# Patient Record
Sex: Female | Born: 1970
Health system: Southern US, Community
[De-identification: ages and names within clinical notes are randomized; demographics above are authoritative.]

## PROBLEM LIST (undated history)

## (undated) DIAGNOSIS — F329 Major depressive disorder, single episode, unspecified: Secondary | ICD-10-CM

## (undated) DIAGNOSIS — IMO0002 Reserved for concepts with insufficient information to code with codable children: Secondary | ICD-10-CM

## (undated) DIAGNOSIS — B009 Herpesviral infection, unspecified: Secondary | ICD-10-CM

## (undated) DIAGNOSIS — R519 Headache, unspecified: Secondary | ICD-10-CM

## (undated) DIAGNOSIS — F1910 Other psychoactive substance abuse, uncomplicated: Secondary | ICD-10-CM

## (undated) DIAGNOSIS — I10 Essential (primary) hypertension: Secondary | ICD-10-CM

## (undated) DIAGNOSIS — E785 Hyperlipidemia, unspecified: Secondary | ICD-10-CM

## (undated) DIAGNOSIS — K219 Gastro-esophageal reflux disease without esophagitis: Secondary | ICD-10-CM

## (undated) DIAGNOSIS — Z87442 Personal history of urinary calculi: Secondary | ICD-10-CM

## (undated) DIAGNOSIS — D649 Anemia, unspecified: Secondary | ICD-10-CM

## (undated) DIAGNOSIS — R87629 Unspecified abnormal cytological findings in specimens from vagina: Secondary | ICD-10-CM

## (undated) DIAGNOSIS — K589 Irritable bowel syndrome without diarrhea: Secondary | ICD-10-CM

## (undated) DIAGNOSIS — E119 Type 2 diabetes mellitus without complications: Secondary | ICD-10-CM

## (undated) DIAGNOSIS — R011 Cardiac murmur, unspecified: Secondary | ICD-10-CM

## (undated) DIAGNOSIS — F419 Anxiety disorder, unspecified: Secondary | ICD-10-CM

## (undated) DIAGNOSIS — F32A Depression, unspecified: Secondary | ICD-10-CM

## (undated) HISTORY — DX: Cardiac murmur, unspecified: R01.1

## (undated) HISTORY — DX: Major depressive disorder, single episode, unspecified: F32.9

## (undated) HISTORY — DX: Essential (primary) hypertension: I10

## (undated) HISTORY — PX: JOINT REPLACEMENT: SHX530

## (undated) HISTORY — PX: ESOPHAGOGASTRODUODENOSCOPY ENDOSCOPY: SHX5814

## (undated) HISTORY — DX: Anemia, unspecified: D64.9

## (undated) HISTORY — DX: Hyperlipidemia, unspecified: E78.5

## (undated) HISTORY — DX: Unspecified abnormal cytological findings in specimens from vagina: R87.629

## (undated) HISTORY — PX: SACRAL NERVE STIMULATOR PLACEMENT: SHX2367

## (undated) HISTORY — DX: Reserved for concepts with insufficient information to code with codable children: IMO0002

## (undated) HISTORY — DX: Irritable bowel syndrome, unspecified: K58.9

## (undated) HISTORY — PX: APPENDECTOMY: SHX54

## (undated) HISTORY — DX: Depression, unspecified: F32.A

## (undated) NOTE — *Deleted (*Deleted)
Patient Care Team: Copland, Gwenlyn Found, MD as PCP - General (Family Medicine)  DIAGNOSIS: No diagnosis found.  CHIEF COMPLIANT: Follow-up of hemochromatosis   INTERVAL HISTORY: Lisa [name] is a 83 y.o. with above-mentioned history of leukocytopenia, hemochromatosis, and B-12 deficiency who receives phlebotomy every 2 months.She presents to the clinic today to review her labs.  ALLERGIES:  is allergic to nsaids, latex, and nitroglycerin.  MEDICATIONS:  Current Outpatient Medications  Medication Sig Dispense Refill  . diphenoxylate-atropine (LOMOTIL) 2.5-0.025 MG tablet TAKE 1 TABLET BY MOUTH 4 (FOUR) TIMES DAILY AS NEEDED FOR DIARRHEA OR LOOSE STOOLS. 120 tablet 2  . gabapentin (NEURONTIN) 300 MG capsule Take 2 capsules (600 mg total) by mouth 4 (four) times daily. 720 capsule 1  . Lidocaine (HM LIDOCAINE PATCH) 4 % PTCH Apply 1 patch topically daily as needed. 10 patch 11  . lisinopril (ZESTRIL) 10 MG tablet Take 1 tablet (10 mg total) by mouth daily. 90 tablet 3  . MAGNESIUM PO Take 1 tablet by mouth daily.    . medroxyPROGESTERone (DEPO-PROVERA) 150 MG/ML injection Inject 1 mL (150 mg total) into the muscle once. 1 mL 0  . metoCLOPramide (REGLAN) 10 MG tablet Take 1 tablet (10 mg total) by mouth every 6 (six) hours as needed for nausea (or headache). 30 tablet 0  . nitrofurantoin, macrocrystal-monohydrate, (MACROBID) 100 MG capsule Take 1 capsule (100 mg total) by mouth 2 (two) times daily. 14 capsule 0  . ondansetron (ZOFRAN) 4 MG tablet Take 1 tablet (4 mg total) by mouth every 8 (eight) hours as needed for nausea or vomiting. 30 tablet 0  . pantoprazole (PROTONIX) 40 MG tablet Take 1 tablet (40 mg total) by mouth daily. 90 tablet 3  . POTASSIUM PO Take 1 tablet by mouth daily.    . simvastatin (ZOCOR) 20 MG tablet Take 1 tablet (20 mg total) by mouth every evening. 90 tablet 3  . SUMAtriptan (IMITREX) 50 MG tablet Take 1 tablet (50 mg total) by mouth every 2 (two) hours as needed  for migraine. Max 100 mg in 24 hours 10 tablet 3  . traMADol (ULTRAM) 50 MG tablet Take 1 tablet (50 mg total) by mouth 3 (three) times daily. 90 tablet 1  . Travoprost, BAK Free, (TRAVATAN Z) 0.004 % SOLN ophthalmic solution     . traZODone (DESYREL) 50 MG tablet TAKE 1/2 TO 1 TABLET BY MOUTH AT BEDTIME AS NEEDED FOR SLEEP. MAY TAKE 2 AS NEEDED 180 tablet 3  . Vitamin D, Ergocalciferol, (DRISDOL) 1.25 MG (50000 UT) CAPS capsule Take 1 capsule (50,000 Units total) by mouth every 7 (seven) days. 8 capsule 0   No current facility-administered medications for this visit.    PHYSICAL EXAMINATION: ECOG PERFORMANCE STATUS: {CHL ONC ECOG [phone]}  There were no vitals filed for this visit. There were no vitals filed for this visit.  LABORATORY DATA:  I have reviewed the data as listed CMP Latest Ref Rng & Units 11/25/2019 04/07/2019 03/18/2018  Glucose 70 - 99 mg/dL 161(W) 960(A) 540(J)  BUN 6 - 20 mg/dL 8 10 8   Creatinine 0.44 - 1.00 mg/dL 8.11 9.14(N) 8.29  Sodium 135 - 145 mmol/L 141 141 136  Potassium 3.5 - 5.1 mmol/L 3.9 4.5 3.5  Chloride 98 - 111 mmol/L 108 104 104  CO2 22 - 32 mmol/L 24 22 21(L)  Calcium 8.9 - 10.3 mg/dL 9.3 9.2 5.6(O)  Total Protein 6.5 - 8.1 g/dL 7.4 6.9 -  Total Bilirubin 0.3 - 1.2  mg/dL 0.9(W) 0.3 -  Alkaline Phos 38 - 126 U/L 51 70 -  AST 15 - 41 U/L 38 147(H) -  ALT 0 - 44 U/L 28 80(H) -    Lab Results  Component Value Date   WBC 4.1 11/25/2019   HGB 11.5 (L) 11/25/2019   HCT 35.9 (L) 11/25/2019   MCV 97.8 11/25/2019   PLT 214 11/25/2019   NEUTROABS 1.9 11/25/2019    ASSESSMENT & PLAN:  No problem-specific Assessment & Plan notes found for this encounter.    No orders of the defined types were placed in this encounter.  The patient has a good understanding of the overall plan. she agrees with it. she will call with any problems that may develop before the next visit here.  Total time spent: *** mins including face to face time and time  spent for planning, charting and coordination of care  Serena Croissant, MD 06/21/2020  I, Kirt Boys Dorshimer, am acting as scribe for Dr. Serena Croissant.  {insert scribe attestation}

---
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
## 2004-02-29 ENCOUNTER — Inpatient Hospital Stay (HOSPITAL_COMMUNITY): Admission: AD | Admit: 2004-02-29 | Discharge: 2004-03-03 | Payer: Self-pay | Admitting: Obstetrics and Gynecology

## 2006-06-21 ENCOUNTER — Inpatient Hospital Stay (HOSPITAL_COMMUNITY): Admission: EM | Admit: 2006-06-21 | Discharge: 2006-06-23 | Payer: Self-pay | Admitting: Emergency Medicine

## 2008-04-08 ENCOUNTER — Inpatient Hospital Stay (HOSPITAL_COMMUNITY): Admission: EM | Admit: 2008-04-08 | Discharge: 2008-04-14 | Payer: Self-pay | Admitting: Emergency Medicine

## 2008-11-15 ENCOUNTER — Encounter: Admission: RE | Admit: 2008-11-15 | Discharge: 2008-11-15 | Payer: Self-pay | Admitting: Gastroenterology

## 2009-07-20 ENCOUNTER — Emergency Department (HOSPITAL_COMMUNITY): Admission: EM | Admit: 2009-07-20 | Discharge: 2009-07-20 | Payer: Self-pay | Admitting: Emergency Medicine

## 2009-08-26 ENCOUNTER — Emergency Department (HOSPITAL_COMMUNITY): Admission: EM | Admit: 2009-08-26 | Discharge: 2009-08-26 | Payer: Self-pay | Admitting: Family Medicine

## 2009-09-01 ENCOUNTER — Encounter: Admission: RE | Admit: 2009-09-01 | Discharge: 2009-09-01 | Payer: Self-pay | Admitting: Family Medicine

## 2010-10-29 LAB — POCT I-STAT, CHEM 8
Calcium, Ion: 1.15 mmol/L (ref 1.12–1.32)
Creatinine, Ser: 0.4 mg/dL (ref 0.4–1.2)
Glucose, Bld: 105 mg/dL — ABNORMAL HIGH (ref 70–99)
HCT: 43 % (ref 36.0–46.0)
Hemoglobin: 14.6 g/dL (ref 12.0–15.0)
Potassium: 3.2 mEq/L — ABNORMAL LOW (ref 3.5–5.1)
TCO2: 27 mmol/L (ref 0–100)

## 2010-10-29 LAB — DIFFERENTIAL
Basophils Absolute: 0 10*3/uL (ref 0.0–0.1)
Basophils Relative: 1 % (ref 0–1)
Eosinophils Relative: 1 % (ref 0–5)
Lymphs Abs: 1.9 10*3/uL (ref 0.7–4.0)
Monocytes Absolute: 0.4 10*3/uL (ref 0.1–1.0)
Monocytes Relative: 10 % (ref 3–12)

## 2010-10-29 LAB — CBC
Hemoglobin: 13.5 g/dL (ref 12.0–15.0)
RBC: 3.88 MIL/uL (ref 3.87–5.11)
WBC: 4.2 10*3/uL (ref 4.0–10.5)

## 2010-11-14 LAB — POCT I-STAT, CHEM 8
Creatinine, Ser: 0.3 mg/dL — ABNORMAL LOW (ref 0.4–1.2)
Hemoglobin: 14.3 g/dL (ref 12.0–15.0)
Sodium: 139 mEq/L (ref 135–145)
TCO2: 21 mmol/L (ref 0–100)

## 2010-12-26 NOTE — H&P (Signed)
Lisa [name], Lisa [name].:  [id number]   MEDICAL RECORD NO.:  [id number]          [name] TYPE:  EMS   LOCATION:  ED                           FACILITY:  [facility]   PHYSICIAN:  Velora Heckler, MD      DATE OF BIRTH:  July 03, 1971   DATE OF ADMISSION:  04/08/2008  DATE OF DISCHARGE:                              HISTORY & PHYSICAL   REFERRING PHYSICIAN:  Janace Hoard, M.D., [facility].   GASTROENTEROLOGIST:  Shirley Friar, M.D., [facility].   CHIEF COMPLAINT:  Abdominal pain, contained perforation, duodenal ulcer.   HISTORY OF PRESENT ILLNESS:  Lisa [name] is a 40 year old female from  [place], [place].  She presented to [facility] with a  48-hour history of abdominal pain.  This was localized in Lisa right  upper quadrant.  Lisa pain radiated to Lisa back.  It had become more  severe in Lisa past 12 hours.  Lisa [name] was sent to [facility] for a CT scan of Lisa abdomen to rule out appendicitis.  Inflammatory changes were noted around Lisa duodenum.  Upper GI series  was performed, which reportedly demonstrated perforation of Lisa  duodenum.  Lisa [name] was referred to general surgery for evaluation  and management.  Case was discussed with Dr. Charlott Rakes.   PAST MEDICAL HISTORY:  History of peptic ulcer disease, history of  irritable bowel syndrome, last EGD performed by Dr. Bosie Clos January  2009.   MEDICATIONS:  Nexium, Depo-Provera.   ALLERGIES:  None known.   SOCIAL HISTORY:  Lisa [name] is single.  She has a 70-year-old daughter.  She smokes less than a pack of cigarettes a day.  She drinks wine daily.  She works as a Engineer, materials at [employer]   FAMILY HISTORY:  Noncontributory.   REVIEW OF SYSTEMS:  Fifteen-system review without significant other  findings except as noted above.   PHYSICAL EXAMINATION:  GENERAL:  A 40 year old well-developed, well-  nourished female in no acute  distress.  VITAL SIGNS:  Temperature 97.9, pulse 105, respirations 16, blood  pressure 130/91.  HEENT:  Shows her to be normocephalic, atraumatic.  Sclerae are clear.  Conjunctivae clear.  Pupils equal and reactive.  Dentition good.  Mucous  membranes moist.  Voice normal.  NECK:  Palpation of Lisa neck shows no thyroid nodularity.  No  lymphadenopathy.  No tenderness.  No mass.  LUNGS:  Clear to auscultation bilaterally without rales, rhonchi or  wheeze.  CARDIAC:  Shows regular rate and rhythm without murmur.  Peripheral  pulses are full.  EXTREMITIES:  Nontender without edema.  ABDOMEN:  Soft without distention.  No surgical wounds.  Bowel sounds  are present.  Palpation shows mild-to-moderate tenderness in Lisa right  upper quadrant.  There is mild guarding.  There is no rebound  tenderness.  There is no palpable mass.  There is no hepatosplenomegaly.  There is no sign of herniation.  NEUROLOGIC:  Lisa [name] is alert and oriented without focal deficit.  There is no sign of tremor.   Laboratory values  provided by [facility] include a white count  of 8.5, hemoglobin 13.0, platelet 248,000.   Radiographic studies:  A CT scan abdomen and pelvis and upper GI series  views are provided on a DVD from Lisa [facility] radiologist.  These  are reviewed with Lisa radiology department here at [facility].  There are inflammatory changes around Lisa duodenum with a  small amount of fluid layering in Morison's pouch.  There is no sign of  free air.  Upper GI series does not demonstrate obvious free  perforation.   IMPRESSION:  Contained perforation of duodenal ulcer.   PLAN:  Lisa [name] will be admitted on Lisa general surgical service.  She will be started on empiric intravenous antibiotics.  She will be  held NPO and receive intravenous hydration.  She will be started on  intravenous proton pump inhibitors.  Lisa [name] will be observed  closely.   I  discussed with Lisa [name] Lisa options of immediate surgical  intervention, which will be a rather extensive procedure given Lisa site  of perforation, versus nonoperative management initially.  I did caution  her that if nonoperative management were to fail she would require a  more extensive surgical procedure.  She understands.   Lisa [name] will be admitted on my general surgical service.      Velora Heckler, MD  Electronically Signed     TMG/MEDQ  D:  04/08/2008  T:  04/08/2008  Job:  [id number]   cc:   Shirley Friar, MD  Fax: [phone]   Peyton Najjar, MD

## 2010-12-26 NOTE — Discharge Summary (Signed)
Lisa [name], BEHRING NO.:  [id number]   MEDICAL RECORD NO.:  [id number]          PATIENT TYPE:  INP   LOCATION:  1502                         FACILITY:  [facility]   PHYSICIAN:  Velora Heckler, MD      DATE OF BIRTH:  Feb 21, 1971   DATE OF ADMISSION:  04/08/2008  DATE OF DISCHARGE:  04/14/2008                               DISCHARGE SUMMARY   REASON FOR ADMISSION:  Abdominal pain, contained perforation of duodenal  ulcer.   HISTORY OF PRESENT ILLNESS:  The patient is a 40 year old female from  [place], [place].  She presented to urgent medical care to Dr.  Janace Hoard with 48-hour history of abdominal pain.  This was localized  to the right upper quadrant.  Pain radiated to the back.  The patient  underwent CT scan of the abdomen to rule out appendicitis.  Inflammatory  changes were noted around the duodenum.  Upper GI series was performed  at [facility] and reportedly demonstrated perforation of the  duodenum.  The patient was referred to general surgery.  She was  admitted to [facility].  After review of diagnostic  studies, she was admitted on the general surgical service.   HOSPITAL COURSE:  The patient was admitted and placed at bowel rest.  She received intravenous fluids and intravenous antibiotics.  She was  placed on a Protonix infusion.  She was seen in consultation by Dr.  Charlott Rakes from [facility].  The patient stabilized.  She remained afebrile.  Pain gradually improved.  On the third hospital  day, she underwent upper GI series in radiology.  This showed no  evidence of leak from the duodenum.  The patient was started on clear  liquids and advanced to a bland diet which she tolerated well.  The  patient remained stable and clinically improved on her Protonix  infusion.  She was switched to oral medications and was prepared for  discharge home.   DISCHARGE/PLAN:  The patient is discharged  home today, September 2, in  good condition, tolerating a regular diet, ambulating independently.   DISCHARGE MEDICATIONS:  1. Include Protonix 40 mg p.o. b.i.d.  2. Vicodin as needed for pain.   The patient will be seen back in the office of Dr. Charlott Rakes at  [facility] in 2 weeks.  The patient will be seen back in my  office at [facility] as needed.   FINAL DIAGNOSES:  1. Contained perforation duodenal ulcer.  2. Peptic ulcer disease.  3. Abdominal pain.   CONDITION AT DISCHARGE:  Improved.      Velora Heckler, MD  Electronically Signed     TMG/MEDQ  D:  04/14/2008  T:  04/14/2008  Job:  [id number]   cc:   Peyton Najjar, MD   Shirley Friar, MD  Fax: [phone]   [facility]

## 2010-12-26 NOTE — Consult Note (Signed)
Lisa [name], Lisa [name].:  [id number]   MEDICAL RECORD NO.:  [id number]          PATIENT TYPE:  INP   LOCATION:  1502                         FACILITY:  [facility]   PHYSICIAN:  Shirley Friar, MDDATE OF BIRTH:  10-27-1970   DATE OF CONSULTATION:  DATE OF DISCHARGE:                                 CONSULTATION   We were asked to see Ms. Degroote today in consultation by Dr. Adela Glimpse of  the [facility] Service for duodenal perforation.   HISTORY OF PRESENT ILLNESS:  This is a very pleasant 40 year old female  who has had a history of three previous ulcerations.  She was H. pylori  positive and was treated, had a negative H. pylori breath test after  treatment on April 11, 2007.  She tells me that she started feeling  burning in her epigastrium and then muscle soreness in her lower  abdominal quadrants bilaterally about three days ago.  After 24 hours,  she went to her primary care physician, and after a subsequent CT scan  and upper GI series with small-bowel follow-through was found to have a  duodenal perforation.  She was seen by surgery, who feels that the  perforation is contained and that she does not need surgery at this  time.  She describes no fever, vomiting or change in bowel habits.  She  tells me that she has had some abdominal bloating for the past week.  The patient denies any NSAID use.  She is using only Tylenol.  No Alka-  Seltzer.  No Pepto-Bismol.  Also denies cocaine use, drug use.  She does  smoke approximately five cigarettes a day and consumes approximately  three glasses of red wine per day.   Past medical history is significant for peptic ulcer disease and GI  bleed secondary to her peptic ulcer disease.  As mentioned, she was  treated for H. pylori in 2008.  She has a history of IBS, diarrhea  predominant.  Her last upper endoscopy was done August 27, 2007.  Her  most recent ulcer was found to have healed.  The endoscopy exam  was  normal other than a small hiatal hernia.   Current medications include Nexium and Depo-Provera.   She has no known drug allergies.   REVIEW OF SYSTEMS:  As per HPI.   SOCIAL HISTORY:  Per HPI.  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   FAMILY HISTORY:  The patient tells me that she was adopted from [place].  Her family history is unknown.   PHYSICAL EXAMINATION:  GENERAL:  She is alert and oriented, in no  apparent distress, very pleasant to speak with.  VITAL SIGNS:  Her pulse is 108, temperature 98.4, respirations 20, blood  pressure is 99/64.  CARDIOVASCULAR:  Cardiovascular system demonstrates a tachy rate but no  murmurs, rubs or gallops appreciated.  LUNGS:  Clear to auscultation with no wheezes or crackles.  ABDOMEN:  Soft, surprisingly nontender secondary to receiving morphine,  mildly distended.   Labs show an amylase of  27.  BMET significant for a potassium of 3.3 and  glucose of 146.  Her BUN is 1 and creatinine 0.53.  LFTs show a mildly  elevated total bilirubin of 1.6.  ALT is 32, AST is 34, alk phos of 43,  PT is 13.2.   On radiological exam per report she had a CT of her abdomen and pelvis  at [facility] that showed duodenal thickening.  Following  that, she had an upper GI series also at [facility] that  showed duodenal perforation.   ASSESSMENT:  This is a 40 year old female with a contained duodenal  perforation.  The patient looks well at the moment.  This is her fourth  ulceration.   PLAN:  The patient has already been placed on IV Protonix infusion,  antibiotics, IV fluids, pain medications, and she is currently NPO.  We  recommend smoking cessation.  Consider recurrent H. pylori as well as  hypersecretory syndrome such as Zollinger-Ellison or even possible  malignancy.  Once her perforation is healed, she will likely need an  upper endoscopy with biopsies to rule out malignancy.  We  will plan to  check a serum gastrin at this time with some hesitancy as PPI therapy  can raise the gastrin level.  However, if she has ZES then gastrin level  will be markedly elevated despite ongoing PPI use.  We will follow with  you.  Thanks very much for this consultation.      Stephani Police, [place]      Shirley Friar, MD  Electronically Signed    MLY/MEDQ  D:  04/09/2008  T:  04/09/2008  Job:  [id number]   cc:   Shirley Friar, MD  Fax: [phone]   Velora Heckler, MD  [phone] [address]  [place]  [place] [address]   Peyton Najjar, MD

## 2010-12-29 NOTE — Op Note (Signed)
NAMEMICHOL, Lisa [name].:  [id number]   MEDICAL RECORD NO.:  [id number]          PATIENT TYPE:  INP   LOCATION:  3114                         FACILITY:  [facility]   PHYSICIAN:  Shirley Friar, MDDATE OF BIRTH:  1971/06/05   DATE OF PROCEDURE:  06/21/2006  DATE OF DISCHARGE:                                 OPERATIVE REPORT   PROCEDURE:  Upper endoscopy.   INDICATIONS:  Melena, hematemesis.   MEDICATIONS:  Fentanyl 100 mcg IV, Versed 10 mg IV, Cetacaine spray.   FINDINGS:  The endoscope was inserted in the oropharynx and the esophagus  was intubated which was normal in its entirety.  The endoscope was advanced  down to the stomach and no blood was present in the stomach.  There was  scattered erythematous areas in the antrum consistent with gastritis.  Retroflexion was done which revealed scattered punctate hemorrhages in the  fundus and cardia, consistent with gastritis without any active bleeding.  The endoscope was straightened and advanced down into the duodenal bulb and  in the posterior wall of the duodenal bulb was a 5 mm ulcer with a flat red  spot in the central portion.  There were patchy areas of erythema and edema  in other parts of the duodenal bulb consistent with duodenitis.  The second  portion of the duodenum appeared normal.  The endoscope was withdrawn and  the ulcer was re-evaluated and since there was no active bleeding and no  evidence of a vessel protuberance, no intervention was performed.  The flat  red spot was again noted and no bleeding was present.   ASSESSMENT:  1. Peptic ulcer disease (duodenal ulcer).  2. Gastritis.  3. Duodenitis.   PLAN:  1. Change Protonix to 8 mg an hour for continuous infusion.  2. Clear liquid diet.  3. Check H. pylori serology.  4. Transfuse as needed and follow serial hemoglobins.      Shirley Friar, MD  Electronically Signed     VCS/MEDQ  D:  06/21/2006  T:  06/21/2006  Job:   [id number]   cc:   Graylin Shiver, M.D.
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
## 2010-12-29 NOTE — H&P (Signed)
NAMEMARLENA, [name].:  [id number]   MEDICAL RECORD NO.:  [id number]          PATIENT TYPE:  INP   LOCATION:  3114                         FACILITY:  [facility]   PHYSICIAN:  Graylin Shiver, M.D.   DATE OF BIRTH:  1970/08/29   DATE OF ADMISSION:  06/20/2006  DATE OF DISCHARGE:                                HISTORY & PHYSICAL   CHIEF COMPLAINT:  Black stool, vomiting blood.   HISTORY OF PRESENT ILLNESS:  Patient is a 40 year old female who began to  pass melena 24 hours prior to my seeing her.  She got weak, dizzy, and  diaphoretic.  At 2:00 p.m. on June 20, 2006, she vomited some blood and  then did this again at 4:30 p.m.  She has not vomited since.  It has been 8  or 9 hours since vomiting anything.  She took some ibuprofen 2 weeks ago and  also took some today.  She denies abdominal pain.  She went to the urgent  care at [facility] and was sent to the emergency room.  Her hemoglobin was found  to be 7.1.  The patient was told in the past that she might have an ulcer  but this was never documented.  She drinks, she said, 2 or 3 glasses of wine  every night.   PAST MEDICAL HISTORY:  Medical problem:  History of IBS.   PAST SURGICAL HISTORY:  None.   ALLERGIES:  None.   MEDICATIONS:  Depo q.3 months.   SOCIAL HISTORY:  Smokes 2 or 3 cigarettes a day, ETOH history as above.   FAMILY HISTORY:  Non contributory.   REVIEW OF SYSTEMS:  Negative except for above.   PHYSICAL EXAMINATION:  Blood pressure 90/42, pulse 92, respirations 18.  She  does not appear in any acute distress.  She is pale, alert, oriented.  NECK:  Supple, no masses, adenopathy, or goiter.  HEART:  Regular rhythm, no murmurs.  LUNGS:  Clear.  ABDOMEN:  Bowel sounds normal, soft, non tender, no hepatosplenomegaly.  EXTREMITIES:  No edema.   IMPRESSION:  Upper GI bleed.   PLAN:  Patient is being admitted to the hospital at [facility].  She will be  given IV hydration, Protonix IV 40 mg  b.i.d.  She will be transfused blood.  We will proceed with EGD in the morning.           ______________________________  Graylin Shiver, M.D.     SFG/MEDQ  D:  06/21/2006  T:  06/21/2006  Job:  [id number]

## 2010-12-29 NOTE — Op Note (Signed)
NAME:  Lisa [name], Lisa [name]                           ACCOUNT NO.:  [id number]   MEDICAL RECORD NO.:  [id number]                   PATIENT TYPE:  INP   LOCATION:  9163                                 FACILITY:  [facility]   PHYSICIAN:  Lenoard Aden, M.D.             DATE OF BIRTH:  04/01/1971   DATE OF PROCEDURE:  03/01/2004  DATE OF DISCHARGE:                                 OPERATIVE REPORT   INDICATIONS FOR PROCEDURE:  Persistent variable decelerations with secondary  fetal tachycardia.   SURGEON:  Lenoard Aden, M.D.   PROCEDURE:  ___________ vacuum assisted delivery.   ANESTHESIA:  Epidural.   ESTIMATED BLOOD LOSS:  500 mL.   COMPLICATIONS:  None.   DESCRIPTION OF PROCEDURE:  After being apprised of risks and benefits of  vacuum assistance, including a small instance of cephalohematoma, scalp  laceration, intracranial hemorrhage, a kiwi cup is explained and placed on  occiput anterior presentation, less than 45 degrees ROA at +4 station after  emptying the bladder for approximately 200 to 300 mL of clear urine.  At  this time, kiwi cup and vacuum assistance is accomplished with three pulls  over intact perineum.  Full term living female, Apgars 8 and 9.  No nuchal  cord noted.  No laceration of cervix.  Intact placenta delivered  spontaneously.  Intact three vessel cord.  No perineal lacerations.  Estimated blood loss 500 mL.  Mother and baby doing well.  Patient  recovering in good condition.                                               Lenoard Aden, M.D.    RJT/MEDQ  D:  03/01/2004  T:  03/01/2004  Job:  [id number]   cc:   [facility]

## 2011-05-16 LAB — BASIC METABOLIC PANEL
BUN: 2 — ABNORMAL LOW
Calcium: 9.5
Creatinine, Ser: 0.44
GFR calc Af Amer: >60
GFR calc non Af Amer: >60

## 2011-10-06 ENCOUNTER — Ambulatory Visit (INDEPENDENT_AMBULATORY_CARE_PROVIDER_SITE_OTHER): Payer: BC Managed Care – PPO | Admitting: Physician Assistant

## 2011-10-06 VITALS — BP 146/94 | HR 121 | Temp 98.4°F | Resp 16 | Ht 68.0 in | Wt 153.0 lb

## 2011-10-06 DIAGNOSIS — IMO0001 Reserved for inherently not codable concepts without codable children: Secondary | ICD-10-CM

## 2011-10-06 DIAGNOSIS — Z309 Encounter for contraceptive management, unspecified: Secondary | ICD-10-CM

## 2011-10-06 MED ORDER — MEDROXYPROGESTERONE ACETATE 150 MG/ML IM SUSP
150.0000 mg | Freq: Once | INTRAMUSCULAR | Status: AC
  Administered 2011-10-06: 150 mg via INTRAMUSCULAR

## 2011-10-06 NOTE — Progress Notes (Signed)
On time for Depo-Provera. Window 2/9/-2/23. Ok for Depo-Provera.  Eula Listen, PA-C 10/06/2011 10:13 AM

## 2011-12-31 ENCOUNTER — Other Ambulatory Visit: Payer: Self-pay | Admitting: Family Medicine

## 2012-01-04 ENCOUNTER — Ambulatory Visit (INDEPENDENT_AMBULATORY_CARE_PROVIDER_SITE_OTHER): Payer: BC Managed Care – PPO | Admitting: Physician Assistant

## 2012-01-04 DIAGNOSIS — Z3040 Encounter for surveillance of contraceptives, unspecified: Secondary | ICD-10-CM

## 2012-01-04 MED ORDER — MEDROXYPROGESTERONE ACETATE 150 MG/ML IM SUSP
150.0000 mg | Freq: Once | INTRAMUSCULAR | Status: AC
  Administered 2012-01-04: 150 mg via INTRAMUSCULAR

## 2012-01-04 NOTE — Progress Notes (Signed)
Pt presents for Deprovera. Authorized by [employer]. Documented in [facility]. Next dose due Aug 9-Aug 23.

## 2012-02-12 ENCOUNTER — Other Ambulatory Visit: Payer: Self-pay | Admitting: Physician Assistant

## 2012-03-17 ENCOUNTER — Other Ambulatory Visit: Payer: Self-pay | Admitting: Physician Assistant

## 2012-03-29 ENCOUNTER — Ambulatory Visit (INDEPENDENT_AMBULATORY_CARE_PROVIDER_SITE_OTHER): Payer: BC Managed Care – PPO | Admitting: Physician Assistant

## 2012-03-29 VITALS — BP 132/94 | HR 105 | Temp 98.3°F | Resp 16 | Ht 67.25 in | Wt 148.8 lb

## 2012-03-29 DIAGNOSIS — I10 Essential (primary) hypertension: Secondary | ICD-10-CM

## 2012-03-29 DIAGNOSIS — K589 Irritable bowel syndrome without diarrhea: Secondary | ICD-10-CM

## 2012-03-29 DIAGNOSIS — E785 Hyperlipidemia, unspecified: Secondary | ICD-10-CM

## 2012-03-29 DIAGNOSIS — Z3002 Counseling and instruction in natural family planning to avoid pregnancy: Secondary | ICD-10-CM

## 2012-03-29 LAB — COMPREHENSIVE METABOLIC PANEL
CO2: 21 mEq/L (ref 19–32)
Chloride: 104 mEq/L (ref 96–112)
Creat: 0.59 mg/dL (ref 0.50–1.10)
Glucose, Bld: 117 mg/dL — ABNORMAL HIGH (ref 70–99)
Sodium: 141 mEq/L (ref 135–145)

## 2012-03-29 LAB — CBC WITH DIFFERENTIAL/PLATELET
Basophils Absolute: 0 10*3/uL (ref 0.0–0.1)
Basophils Relative: 0 % (ref 0–1)
HCT: 37 % (ref 36.0–46.0)
Hemoglobin: 12.7 g/dL (ref 12.0–15.0)
MCH: 30.2 pg (ref 26.0–34.0)
MCV: 87.9 fL (ref 78.0–100.0)
Monocytes Relative: 9 % (ref 3–12)
Neutro Abs: 1.3 10*3/uL — ABNORMAL LOW (ref 1.7–7.7)
Platelets: 263 10*3/uL (ref 150–400)
RBC: 4.21 MIL/uL (ref 3.87–5.11)
WBC: 3.3 10*3/uL — ABNORMAL LOW (ref 4.0–10.5)

## 2012-03-29 LAB — LIPID PANEL
HDL: 68 mg/dL (ref >39)
Total CHOL/HDL Ratio: 4.3 Ratio
Triglycerides: 668 mg/dL — ABNORMAL HIGH (ref <150)

## 2012-03-29 MED ORDER — DIPHENOXYLATE-ATROPINE 2.5-0.025 MG PO TABS: 1.0000 | ORAL_TABLET | Freq: Three times a day (TID) | ORAL | Status: AC | PRN

## 2012-03-29 MED ORDER — MEDROXYPROGESTERONE ACETATE 150 MG/ML IM SUSP
150.0000 mg | Freq: Once | INTRAMUSCULAR | Status: AC
  Administered 2012-03-29: 150 mg via INTRAMUSCULAR

## 2012-03-29 MED ORDER — LISINOPRIL-HYDROCHLOROTHIAZIDE 20-12.5 MG PO TABS: 1.0000 | ORAL_TABLET | Freq: Every day | ORAL | Status: DC

## 2012-03-29 MED ORDER — SIMVASTATIN 10 MG PO TABS: 10.0000 mg | ORAL_TABLET | Freq: Every day | ORAL | Status: DC

## 2012-03-29 NOTE — Progress Notes (Signed)
  Subjective:    Patient ID: Lisa [name], female    DOB: [birth year], 41 y.o.   MRN: [id number]  HPI 41 yr old [place] female here for BP check and labs.  Usually BP is higher in office than outside of office.  She never started the pravastatin Dr. Patsy Lager sent in for her.  She had an abnormal pap in May 2012-LGSIL and has been f/up and treated at gynecologists office. She is due for her Depo today and has another f/up with gyn over the next month or so.  She has a h/o IBS with previous GI work-up. She usu suffers with diarrhea/loose stools and wants to try something for that.  She eats very little wheat/bread.  Review of Systems  All other systems reviewed and are negative.       Objective:   Physical Exam  Nursing note and vitals reviewed. Constitutional: She is oriented to person, place, and time. She appears well-developed and well-nourished.  HENT:  Head: Normocephalic and atraumatic.  Eyes: Pupils are equal, round, and reactive to light.  Cardiovascular: Normal rate, regular rhythm and normal heart sounds.   Pulmonary/Chest: Effort normal and breath sounds normal.  Neurological: She is alert and oriented to person, place, and time.  Skin: Skin is warm and dry.          Assessment & Plan:  Hypertension-suboptimal control, but believe this is probably partially white coat related.  She will check OOO 2-3 times week and record. Hyperlipidemia-patient agrees to start pravastatin now.  Recheck lipids and LFTs  in 3months when next Depo is due.  IBS-predominately diarrhea-will try lomotil prn and see how that does. BC planning-depo provera.  She is following up for a recheck with the gynecologist that treated her LGSIL within the next few weeks.  She is to obtain her most recent pap or their recommendation for her f/up and treatment for [place] to continue Depo.

## 2012-06-19 ENCOUNTER — Ambulatory Visit (INDEPENDENT_AMBULATORY_CARE_PROVIDER_SITE_OTHER): Payer: BC Managed Care – PPO | Admitting: Family Medicine

## 2012-06-19 ENCOUNTER — Other Ambulatory Visit: Payer: Self-pay | Admitting: Physician Assistant

## 2012-06-19 ENCOUNTER — Ambulatory Visit: Payer: BC Managed Care – PPO

## 2012-06-19 VITALS — BP 118/88 | HR 91 | Temp 99.0°F | Resp 16 | Ht 67.5 in | Wt 152.0 lb

## 2012-06-19 DIAGNOSIS — Z23 Encounter for immunization: Secondary | ICD-10-CM

## 2012-06-19 DIAGNOSIS — Z309 Encounter for contraceptive management, unspecified: Secondary | ICD-10-CM

## 2012-06-19 DIAGNOSIS — M25559 Pain in unspecified hip: Secondary | ICD-10-CM

## 2012-06-19 MED ORDER — MEDROXYPROGESTERONE ACETATE 150 MG/ML IM SUSP
150.0000 mg | Freq: Once | INTRAMUSCULAR | Status: AC
  Administered 2012-06-19: 150 mg via INTRAMUSCULAR

## 2012-06-19 MED ORDER — TRAMADOL HCL 50 MG PO TABS: 50.0000 mg | ORAL_TABLET | Freq: Three times a day (TID) | ORAL | Status: DC | PRN

## 2012-06-19 NOTE — Patient Instructions (Addendum)
Let me know if your hip/ knee are not better in the next few days-Sooner if worse.

## 2012-06-19 NOTE — Progress Notes (Signed)
[facility] [address] [place] [address] [phone]  Date:  06/19/2012   Name:  Lisa [name]   DOB:  October 03, 1970   MRN:  [id number]  PCP:  No primary provider on file.    Chief Complaint: Leg Injury and Contraception   History of Present Illness:  Lisa [name] is a 41 y.o. very pleasant female patient who presents with the following:  She needs a depo- provera shot today- she is in her window and is not SA so she is ok to have a shot today.    She also is concerned about an injury to her hip/ leg About one week ago she hurt her left hip- she was going around a corner and twisted the leg. It hurt, but she seemed to shake it off and was able to continue with her day.  However, it has continued to be intermittently quite painful.  It hurts worst first thing in the morning.  Sometimes she has to limp.  It can be hard to get off the toilet.  In fact, today she went to stand up from the toiled and had very severe pain in the anterior hip area so she decided to be seen. Her left knee has started to hurt as well.    There is no problem list on file for this patient.   Past Medical History  Diagnosis Date  . Hyperlipidemia   . Hypertension   . Depression   . Ulcer   . Heart murmur   . Anemia     History reviewed. No pertinent past surgical history.  History  Substance Use Topics  . Smoking status: Current Every Day Smoker -- 0.5 packs/day  . Smokeless tobacco: Never Used  . Alcohol Use: 1.0 oz/week    2 drink(s) per week    Family History  Problem Relation Age of Onset  . Adopted: Yes  . Family history unknown: Yes    Allergies  Allergen Reactions  . Nsaids Other (See Comments)    ulcers    Medication list has been reviewed and updated.  Current Outpatient Prescriptions on File Prior to Visit  Medication Sig Dispense Refill  . lisinopril-hydrochlorothiazide (PRINZIDE,ZESTORETIC) 20-12.5 MG per tablet Take 1 tablet by mouth daily.  90  tablet  1  . medroxyPROGESTERone (DEPO-PROVERA) 150 MG/ML injection Inject 150 mg into the muscle every 3 (three) months.      . pantoprazole (PROTONIX) 40 MG tablet Take 40 mg by mouth daily.      . simvastatin (ZOCOR) 10 MG tablet Take 1 tablet (10 mg total) by mouth at bedtime.  90 tablet  1   No current facility-administered medications on file prior to visit.    Review of Systems:  As per HPI- otherwise negative.  She does not have any stomach or urinary symptoms    Physical Examination: Filed Vitals:   06/19/12 0923  BP: 118/88  Pulse: 91  Temp: 99 F (37.2 C)  Resp: 16   Filed Vitals:   06/19/12 0923  Height: 5' 7.5" (1.715 m)  Weight: 152 lb (68.947 kg)   Body mass index is 23.46 kg/(m^2). Ideal Body Weight: Weight in (lb) to have BMI = 25: 161.7   GEN: WDWN, NAD, Non-toxic, A & O x 3, looks well HEENT: Atraumatic, Normocephalic. Neck supple. No masses, No LAD. Ears and Nose: No external deformity. CV: RRR, No M/G/R. No JVD. No thrill. No extra heart sounds. PULM: CTA B, no  wheezes, crackles, rhonchi. No retractions. No resp. distress. No accessory muscle use. ABD: S, NT, ND Left leg: no pain or swelling of the calf.  Ankle and knee are normal, stable, non- tender.  She has pain over her left anterior hip and pain with abduction of the hip.  Log- rolling the hip is tender as well.  She has pain with resistance against her hip flexors EXTR: No c/c/e NEURO will limp the first few steps and then be able to walk normally   [facility]: Normally interactive. Conversant. Not depressed or anxious appearing.  Calm demeanor.   [facility] reading (PRIMARY) by  Dr. Patsy Lager. Left hip: normal Pelvis: ? Tiny avulsion fracture  LEFT HIP - COMPLETE 2+ VIEW  Comparison: None.  Findings: No acute fracture and no dislocation. Joint spaces are maintained. No convincing increased sclerosis within the femoral head to suggest avascular necrosis. Unremarkable soft tissues.  IMPRESSION: No  acute bony pathology.  PELVIS - 1-2 VIEW  Comparison: None.  Findings: No acute fracture and no dislocation. Tiny bony density adjacent to the superior left acetabulum has a chronic appearance. Unremarkable soft tissues.  IMPRESSION: No acute bony pathology.  Assessment and Plan: 1. Contraception management  medroxyPROGESTERone (DEPO-PROVERA) injection 150 mg  2. Hip pain  DG Pelvis 1-2 Views, DG Hip Complete Left, traMADol (ULTRAM) 50 MG tablet   Gave Depo shot today.  suspect that Eleesha pulled a hip flexor muscle last week.  She continues to have pain.  No evidence of a more serious injury on her x rays.  Will use ultram for pain.  She will let me know if not better in the next few days- Sooner if worse.    Abbe Amsterdam, MD

## 2012-06-21 ENCOUNTER — Other Ambulatory Visit: Payer: Self-pay | Admitting: *Deleted

## 2012-07-16 ENCOUNTER — Encounter: Payer: BC Managed Care – PPO | Admitting: Physician Assistant

## 2012-09-23 ENCOUNTER — Other Ambulatory Visit: Payer: Self-pay | Admitting: Physician Assistant

## 2012-10-11 ENCOUNTER — Ambulatory Visit: Payer: BC Managed Care – PPO

## 2012-10-11 ENCOUNTER — Ambulatory Visit (INDEPENDENT_AMBULATORY_CARE_PROVIDER_SITE_OTHER): Payer: BC Managed Care – PPO | Admitting: Family Medicine

## 2012-10-11 VITALS — BP 123/84 | HR 84 | Temp 98.0°F | Resp 18 | Ht 67.0 in | Wt 149.6 lb

## 2012-10-11 DIAGNOSIS — M25559 Pain in unspecified hip: Secondary | ICD-10-CM

## 2012-10-11 DIAGNOSIS — E785 Hyperlipidemia, unspecified: Secondary | ICD-10-CM

## 2012-10-11 DIAGNOSIS — M25552 Pain in left hip: Secondary | ICD-10-CM

## 2012-10-11 DIAGNOSIS — I10 Essential (primary) hypertension: Secondary | ICD-10-CM

## 2012-10-11 DIAGNOSIS — D72819 Decreased white blood cell count, unspecified: Secondary | ICD-10-CM

## 2012-10-11 DIAGNOSIS — Z309 Encounter for contraceptive management, unspecified: Secondary | ICD-10-CM

## 2012-10-11 DIAGNOSIS — K589 Irritable bowel syndrome without diarrhea: Secondary | ICD-10-CM

## 2012-10-11 LAB — COMPREHENSIVE METABOLIC PANEL
AST: 62 U/L — ABNORMAL HIGH (ref 0–37)
BUN: 9 mg/dL (ref 6–23)
Calcium: 9.5 mg/dL (ref 8.4–10.5)
Chloride: 104 mEq/L (ref 96–112)
Creat: 0.43 mg/dL — ABNORMAL LOW (ref 0.50–1.10)
Glucose, Bld: 108 mg/dL — ABNORMAL HIGH (ref 70–99)

## 2012-10-11 LAB — POCT CBC
MCH, POC: 30.3 pg (ref 27–31.2)
MCV: 96.7 fL (ref 80–97)
MID (cbc): 0.3 (ref 0–0.9)
POC LYMPH PERCENT: 59.3 %L — AB (ref 10–50)
Platelet Count, POC: 295 10*3/uL (ref 142–424)
RBC: 4.16 M/uL (ref 4.04–5.48)
WBC: 3.1 10*3/uL — AB (ref 4.6–10.2)

## 2012-10-11 LAB — LIPID PANEL
Cholesterol: 199 mg/dL (ref 0–200)
HDL: 72 mg/dL (ref >39)
Total CHOL/HDL Ratio: 2.8 Ratio
Triglycerides: 388 mg/dL — ABNORMAL HIGH (ref <150)

## 2012-10-11 LAB — POCT URINE PREGNANCY: Preg Test, Ur: NEGATIVE

## 2012-10-11 MED ORDER — LISINOPRIL-HYDROCHLOROTHIAZIDE 20-12.5 MG PO TABS: 1.0000 | ORAL_TABLET | Freq: Every day | ORAL | Status: DC

## 2012-10-11 MED ORDER — MEDROXYPROGESTERONE ACETATE 150 MG/ML IM SUSP
150.0000 mg | Freq: Once | INTRAMUSCULAR | Status: AC
  Administered 2012-10-11: 150 mg via INTRAMUSCULAR

## 2012-10-11 MED ORDER — DIPHENOXYLATE-ATROPINE 2.5-0.025 MG PO TABS: ORAL_TABLET | ORAL | Status: DC

## 2012-10-11 MED ORDER — MEDROXYPROGESTERONE ACETATE 150 MG/ML IM SUSP: 150.0000 mg | INTRAMUSCULAR | Status: DC

## 2012-10-11 NOTE — Progress Notes (Signed)
[facility] [address], [place] [address] [phone]  Date:  10/11/2012   Name:  Lisa [name]   DOB:  [birth year]   MRN:  [id number]  PCP:  No primary provider on file.    Chief Complaint: Follow-up   History of Present Illness:  Lisa [name] is a 42 y.o. very pleasant female patient who presents with the following:  Here today for follow- up, and she needs her Depo- provera shot. Last Depo 06/19/2012- her window would have been 1/23- 2/6 so she is late today. However, she has not been SA in about 2 years so there should be no risk of pregnancy  She is also here to recheck her left hip- see OV from 06/19/2012. She has noted pain in her left hip after she twisted it at that visit.  Since then her hip has hurt intermittently, more so since she fell on ice about 2 weeks ago.  She has also noted more pain with change in weather.  She is getting concerned as her hip hurts more than she would expect given her recent fall, and she is sometimes having a hard time walking.   She has been taking tylenol arthritis which has helped with her pain.  We did do x-rays in November which looked ok.    She also has a history of HTN and hyperlipidemia- she needs to have her medications refilled.  She is fasting today so we can check an FLP.  Her main lipid issue has been very high triglycerides- she is currently taking simvastatin 10 mg.  Simvastatin was started at her last lipid check in august when her triglycerides were 688.  She has also been noted to have slightly elevated LFTS over the last several years- normal at check in 12/2010.  Also noted to have a slighlty low WBC count at her visit in August- this is not baseline, wbc count has been normal but also slightly low on several occasions in the past There is no problem list on file for this patient.   Past Medical History  Diagnosis Date  . Hyperlipidemia   . Hypertension   . Depression   . Ulcer   . Heart murmur   .  Anemia     History reviewed. No pertinent past surgical history.  History  Substance Use Topics  . Smoking status: Current Every Day Smoker -- 0.50 packs/day  . Smokeless tobacco: Never Used  . Alcohol Use: 1.0 oz/week    2 drink(s) per week    Family History  Problem Relation Age of Onset  . Adopted: Yes    Allergies  Allergen Reactions  . Nsaids Other (See Comments)    ulcers    Medication list has been reviewed and updated.  Current Outpatient Prescriptions on File Prior to Visit  Medication Sig Dispense Refill  . diphenoxylate-atropine (LOMOTIL) 2.5-0.025 MG per tablet TAKE 1 TABLET BY MOUTH 3 TIMES A DAY AS NEEDED FOR DIARRHEA OR LOOSE STOOLS  90 tablet  0  . lisinopril-hydrochlorothiazide (PRINZIDE,ZESTORETIC) 20-12.5 MG per tablet Take 1 tablet by mouth daily. Needs office visit/labs  90 tablet  0  . medroxyPROGESTERone (DEPO-PROVERA) 150 MG/ML injection Inject 150 mg into the muscle every 3 (three) months.      . pantoprazole (PROTONIX) 40 MG tablet Take 40 mg by mouth daily.      . simvastatin (ZOCOR) 10 MG tablet Take 1 tablet (10 mg total) by mouth at bedtime. Needs office  visit/labs  90 tablet  0  . traMADol (ULTRAM) 50 MG tablet Take 1 tablet (50 mg total) by mouth every 8 (eight) hours as needed for pain.  30 tablet  0   No current facility-administered medications on file prior to visit.    Review of Systems:  As per HPI- otherwise negative.   Physical Examination: Filed Vitals:   10/11/12 0951  BP: 123/84  Pulse: 84  Temp: 98 F (36.7 C)  Resp: 18   Filed Vitals:   10/11/12 0951  Height: 5\' 7"  (1.702 m)  Weight: 149 lb 9.6 oz (67.858 kg)   Body mass index is 23.43 kg/(m^2). Ideal Body Weight: Weight in (lb) to have BMI = 25: 159.3  GEN: WDWN, NAD, Non-toxic, A & O x 3 HEENT: Atraumatic, Normocephalic. Neck supple. No masses, No LAD. Ears and Nose: No external deformity. CV: RRR, No M/G/R. No JVD. No thrill. No extra heart sounds. PULM:  CTA B, no wheezes, crackles, rhonchi. No retractions. No resp. distress. No accessory muscle use. ABD: S, NT, ND, +BS. No rebound. No HSM. EXTR: No c/c/e NEURO Normal gait.  PSYCH: Normally interactive. Conversant. Not depressed or anxious appearing.  Calm demeanor.  Left hip: she has tenderness near the joint especially with flexion of the hip.  No redness, heat or rash  Results for orders placed in visit on 10/11/12  POCT URINE PREGNANCY      Result Value Range   Preg Test, Ur Negative    POCT CBC      Result Value Range   WBC 3.1 (*) 4.6 - 10.2 K/uL   Lymph, poc 1.8  0.6 - 3.4   POC LYMPH PERCENT 59.3 (*) 10 - 50 %L   MID (cbc) 0.3  0 - 0.9   POC MID % 10.6  0 - 12 %M   POC Granulocyte 0.9 (*) 2 - 6.9   Granulocyte percent 30.1 (*) 37 - 80 %G   RBC 4.16  4.04 - 5.48 M/uL   Hemoglobin 12.6  12.2 - 16.2 g/dL   HCT, POC 16.1  09.6 - 47.9 %   MCV 96.7  80 - 97 fL   MCH, POC 30.3  27 - 31.2 pg   MCHC 31.3 (*) 31.8 - 35.4 g/dL   RDW, POC 04.5     Platelet Count, POC 295  142 - 424 K/uL   MPV 8.7  0 - 99.8 fL   [facility] reading (PRIMARY) by  Dr. Patsy Lager. Left hip: normal, no sign of AVN or fracture LEFT HIP - COMPLETE 2+ VIEW  Comparison: 06/19/2012  Findings: SI joints and hip joints are symmetric and unremarkable. No acute bony abnormality. Specifically, no fracture, subluxation, or dislocation. Soft tissues are intact.  IMPRESSION: No bony abnormality.  Assessment and Plan: Unspecified contraceptive management - Plan: POCT urine pregnancy, medroxyPROGESTERone (DEPO-PROVERA) 150 MG/ML injection  HTN (hypertension) - Plan: POCT CBC, Comprehensive metabolic panel, lisinopril-hydrochlorothiazide (PRINZIDE,ZESTORETIC) 20-12.5 MG per tablet  Other and unspecified hyperlipidemia - Plan: Lipid panel  Hip pain, left - Plan: DG Hip Complete Left, Ambulatory referral to Orthopedic Surgery  IBS (irritable bowel syndrome) - Plan: diphenoxylate-atropine (LOMOTIL) 2.5-0.025 MG per  tablet  Leukopenia - Plan: Pathologist smear review    Elizah Lydon, MD

## 2012-10-13 DIAGNOSIS — E785 Hyperlipidemia, unspecified: Secondary | ICD-10-CM | POA: Insufficient documentation

## 2012-10-13 DIAGNOSIS — I10 Essential (primary) hypertension: Secondary | ICD-10-CM | POA: Insufficient documentation

## 2012-10-16 ENCOUNTER — Encounter: Payer: Self-pay | Admitting: Family Medicine

## 2012-10-16 ENCOUNTER — Telehealth: Payer: Self-pay | Admitting: Family Medicine

## 2012-10-16 DIAGNOSIS — D72819 Decreased white blood cell count, unspecified: Secondary | ICD-10-CM

## 2012-10-16 DIAGNOSIS — E781 Pure hyperglyceridemia: Secondary | ICD-10-CM

## 2012-10-16 MED ORDER — OMEGA-3-ACID ETHYL ESTERS 1 G PO CAPS: 2.0000 g | ORAL_CAPSULE | Freq: Two times a day (BID) | ORAL | Status: DC

## 2012-10-16 NOTE — Telephone Encounter (Signed)
Called and discussed her labs.  Discussed her cholesterol- she would like to try the lovaza- if too expensive she will not fill the rx and let me know.   She recently saw Dr. Bosie Clos- her was not worried about her slight elevation of LFTs.  We will continue to follow these.  He felt they might be due to recent tylenol use due to her hip pain.   Discussed slight leukopenia- offered a recheck in one month vs heme- onc referral.  She would like to recheck in one month- will leave order on chart.  Plan to recheck LFTs and FLP at her next depo-provera appt

## 2012-10-18 ENCOUNTER — Other Ambulatory Visit: Payer: Self-pay | Admitting: Physician Assistant

## 2012-10-20 ENCOUNTER — Encounter: Payer: Self-pay | Admitting: Family Medicine

## 2012-10-20 ENCOUNTER — Other Ambulatory Visit: Payer: Self-pay | Admitting: Family Medicine

## 2012-10-20 DIAGNOSIS — K589 Irritable bowel syndrome without diarrhea: Secondary | ICD-10-CM

## 2012-10-20 MED ORDER — DIPHENOXYLATE-ATROPINE 2.5-0.025 MG PO TABS: ORAL_TABLET | ORAL | Status: DC

## 2012-10-20 NOTE — Progress Notes (Signed)
Pt seen at clinic with her daughter who had an injury today.  She did not receive her lomotil at her last visit- it looks like it was printed and she did not receive it.  Will reprint today- did not realize it was not erx.

## 2012-10-21 ENCOUNTER — Other Ambulatory Visit: Payer: Self-pay | Admitting: Physician Assistant

## 2013-01-04 ENCOUNTER — Other Ambulatory Visit: Payer: Self-pay | Admitting: Physician Assistant

## 2013-01-04 ENCOUNTER — Ambulatory Visit (INDEPENDENT_AMBULATORY_CARE_PROVIDER_SITE_OTHER): Payer: BC Managed Care – PPO | Admitting: Family Medicine

## 2013-01-04 VITALS — BP 110/85 | HR 96 | Temp 98.2°F | Resp 18 | Ht 67.25 in | Wt 145.6 lb

## 2013-01-04 DIAGNOSIS — J019 Acute sinusitis, unspecified: Secondary | ICD-10-CM

## 2013-01-04 DIAGNOSIS — E7800 Pure hypercholesterolemia, unspecified: Secondary | ICD-10-CM

## 2013-01-04 DIAGNOSIS — I10 Essential (primary) hypertension: Secondary | ICD-10-CM

## 2013-01-04 DIAGNOSIS — Z01818 Encounter for other preprocedural examination: Secondary | ICD-10-CM

## 2013-01-04 DIAGNOSIS — Z309 Encounter for contraceptive management, unspecified: Secondary | ICD-10-CM

## 2013-01-04 LAB — COMPREHENSIVE METABOLIC PANEL
AST: 95 U/L — ABNORMAL HIGH (ref 0–37)
Alkaline Phosphatase: 50 U/L (ref 39–117)
BUN: 6 mg/dL (ref 6–23)
Calcium: 10.1 mg/dL (ref 8.4–10.5)
Creat: 0.59 mg/dL (ref 0.50–1.10)

## 2013-01-04 LAB — POCT CBC
Granulocyte percent: 47.2 %G (ref 37–80)
Lymph, poc: 1.7 (ref 0.6–3.4)
MCHC: 31.3 g/dL — AB (ref 31.8–35.4)
MCV: 98.8 fL — AB (ref 80–97)
MID (cbc): 0.3 (ref 0–0.9)
POC Granulocyte: 1.8 — AB (ref 2–6.9)
POC LYMPH PERCENT: 44.5 %L (ref 10–50)
Platelet Count, POC: 217 10*3/uL (ref 142–424)
RDW, POC: 14 %

## 2013-01-04 LAB — LIPID PANEL: Total CHOL/HDL Ratio: 3.1 Ratio

## 2013-01-04 MED ORDER — LISINOPRIL-HYDROCHLOROTHIAZIDE 20-12.5 MG PO TABS: 1.0000 | ORAL_TABLET | Freq: Every day | ORAL | Status: DC

## 2013-01-04 MED ORDER — MEDROXYPROGESTERONE ACETATE 150 MG/ML IM SUSP
150.0000 mg | Freq: Once | INTRAMUSCULAR | Status: AC
  Administered 2013-01-04: 150 mg via INTRAMUSCULAR

## 2013-01-04 NOTE — Patient Instructions (Addendum)
Best of luck with your surgery!  If you need any pre- op clearance form completed please pass it along to me.

## 2013-01-04 NOTE — Progress Notes (Signed)
[facility] [address] [place] [address] [phone]  Date:  01/04/2013   Name:  Lisa [name]   DOB:  1971/03/13   MRN:  [id number]  PCP:  No PCP Per Patient    Chief Complaint: Follow-up   History of Present Illness:  Lisa [name] is a 42 y.o. very pleasant female patient who presents with the following:  She is here today for follow-up, depo shot (in window), and also to discuss having a total left hip replacement for avascular necrosis. She has surgery planned on 02/06/13.  She will have an anterior approach per Dr. Magnus Ivan at [facility] ortho.  They are not sure of the cause of her AVN.  She might like to have a bone density scan but is thinking about it.   She would like to follow- up her slightly elevated LFTs, high triglycerides and mild leukopenia today.   Per my phone note 10/16/12:  Called and discussed her labs. Discussed her cholesterol- she would like to try the lovaza- if too expensive she will not fill the rx and let me know.  She recently saw Dr. Bosie Clos- her was not worried about her slight elevation of LFTs. We will continue to follow these. He felt they might be due to recent tylenol use due to her hip pain.  Discussed slight leukopenia- offered a recheck in one month vs heme- onc referral. She would like to recheck in one month- will leave order on chart. Plan to recheck LFTs and FLP at her next depo-provera appt  She can only flex her left hip about 30 degrees without pain.  She is taking a little bit of hydrocodone per her surgeon, and is trying to get her surgery date moved up if possible.    She had just an egg at about 10:30 am today.  Otherwise she is fasting  Noted mild tachycardia- she feels fine, no SOB  Patient Active Problem List   Diagnosis Date Noted  . Hyperlipidemia 10/13/2012  . HTN (hypertension) 10/13/2012    Past Medical History  Diagnosis Date  . Hyperlipidemia   . Hypertension   . Depression   . Ulcer   .  Heart murmur   . Anemia     History reviewed. No pertinent past surgical history.  History  Substance Use Topics  . Smoking status: Current Every Day Smoker -- 0.50 packs/day  . Smokeless tobacco: Never Used  . Alcohol Use: 1.0 oz/week    2 drink(s) per week    Family History  Problem Relation Age of Onset  . Adopted: Yes    Allergies  Allergen Reactions  . Nsaids Other (See Comments)    ulcers    Medication list has been reviewed and updated.  Current Outpatient Prescriptions on File Prior to Visit  Medication Sig Dispense Refill  . diphenoxylate-atropine (LOMOTIL) 2.5-0.025 MG per tablet TAKE 1 TABLET BY MOUTH 3 TIMES A DAY AS NEEDED FOR DIARRHEA OR LOOSE STOOLS  90 tablet  3  . lisinopril-hydrochlorothiazide (PRINZIDE,ZESTORETIC) 20-12.5 MG per tablet Take 1 tablet by mouth daily.  90 tablet  3  . medroxyPROGESTERone (DEPO-PROVERA) 150 MG/ML injection Inject 1 mL (150 mg total) into the muscle every 3 (three) months.  1 mL  0  . omega-3 acid ethyl esters (LOVAZA) 1 G capsule Take 2 capsules (2 g total) by mouth 2 (two) times daily.  120 capsule  6  . pantoprazole (PROTONIX) 40 MG tablet Take 40 mg by  mouth daily.      . simvastatin (ZOCOR) 10 MG tablet Take 1 tablet (10 mg total) by mouth at bedtime. Needs office visit/labs  90 tablet  0  . traMADol (ULTRAM) 50 MG tablet Take 1 tablet (50 mg total) by mouth every 8 (eight) hours as needed for pain.  30 tablet  0   No current facility-administered medications on file prior to visit.    Review of Systems:  As per HPI- otherwise negative.   Physical Examination: Filed Vitals:   01/04/13 1335  BP: 129/95  Pulse: 109  Temp: 98.2 F (36.8 C)  Resp: 18   Filed Vitals:   01/04/13 1335  Height: 5' 7.25" (1.708 m)  Weight: 145 lb 9.6 oz (66.044 kg)   Body mass index is 22.64 kg/(m^2). Ideal Body Weight: Weight in (lb) to have BMI = 25: 160.5  GEN: WDWN, NAD, Non-toxic, A & O x 3, looks well HEENT: Atraumatic,  Normocephalic. Neck supple. No masses, No LAD. Ears and Nose: No external deformity. CV: RRR, No M/G/R. No JVD. No thrill. No extra heart sounds. PULM: CTA B, no wheezes, crackles, rhonchi. No retractions. No resp. distress. No accessory muscle use. EXTR: No c/c/e NEURO Normal gait except as limited by left hip pain PSYCH: Normally interactive. Conversant. Not depressed or anxious appearing.  Calm demeanor.   EKG:  Sinus rhythm with rate of 101- performed in case needed for pre- op evaluation  Results for orders placed in visit on 01/04/13  POCT CBC      Result Value Range   WBC 3.9 (*) 4.6 - 10.2 K/uL   Lymph, poc 1.7  0.6 - 3.4   POC LYMPH PERCENT 44.5  10 - 50 %L   MID (cbc) 0.3  0 - 0.9   POC MID % 8.3  0 - 12 %M   POC Granulocyte 1.8 (*) 2 - 6.9   Granulocyte percent 47.2  37 - 80 %G   RBC 3.89 (*) 4.04 - 5.48 M/uL   Hemoglobin 12.0 (*) 12.2 - 16.2 g/dL   HCT, POC 16.1  09.6 - 47.9 %   MCV 98.8 (*) 80 - 97 fL   MCH, POC 30.8  27 - 31.2 pg   MCHC 31.3 (*) 31.8 - 35.4 g/dL   RDW, POC 04.5     Platelet Count, POC 217  142 - 424 K/uL   MPV 7.8  0 - 99.8 fL    Assessment and Plan: Unspecified contraceptive management - Plan: medroxyPROGESTERone (DEPO-PROVERA) injection 150 mg  HTN (hypertension) - Plan: lisinopril-hydrochlorothiazide (PRINZIDE,ZESTORETIC) 20-12.5 MG per tablet  Pre-operative clearance - Plan: Comprehensive metabolic panel, POCT CBC, EKG [id number]  High cholesterol - Plan: Lipid panel  BP controlled, refilled her medication.  Depo shot today.  Await labs, will follow up her leukopenia, elevated LFTs and high triglycerieds.    Signed Abbe Amsterdam, MD

## 2013-01-08 ENCOUNTER — Encounter: Payer: Self-pay | Admitting: Family Medicine

## 2013-01-08 ENCOUNTER — Telehealth: Payer: Self-pay | Admitting: Family Medicine

## 2013-01-08 NOTE — Telephone Encounter (Signed)
Called Lisa [name] to go over her labs.  Was able to discuss with DR. Schooler who did not feel any urgent action is needed, and that we could start niacin to bring down her triglyceriedes.  Left her a message on her machine to this effect, and will try her back on Sunday.

## 2013-01-12 ENCOUNTER — Telehealth: Payer: Self-pay | Admitting: Family Medicine

## 2013-01-12 NOTE — Telephone Encounter (Signed)
Called her again- asked her to please call me today if she can so we can discuss her labs

## 2013-01-14 ENCOUNTER — Other Ambulatory Visit (HOSPITAL_COMMUNITY): Payer: Self-pay | Admitting: Orthopaedic Surgery

## 2013-01-14 ENCOUNTER — Telehealth: Payer: Self-pay | Admitting: Family Medicine

## 2013-01-14 NOTE — Telephone Encounter (Signed)
Called and LMOM again.  Asked her to please get in touch with me about her labs.  Will go ahead and send my letter to her dated 5/29- handwrote note on letter as well asking her to call me asap

## 2013-01-15 ENCOUNTER — Telehealth: Payer: Self-pay | Admitting: Radiology

## 2013-01-15 ENCOUNTER — Telehealth: Payer: Self-pay

## 2013-01-15 DIAGNOSIS — R7989 Other specified abnormal findings of blood chemistry: Secondary | ICD-10-CM

## 2013-01-15 DIAGNOSIS — D72819 Decreased white blood cell count, unspecified: Secondary | ICD-10-CM

## 2013-01-15 NOTE — Telephone Encounter (Signed)
Labs mailed

## 2013-01-15 NOTE — Telephone Encounter (Signed)
Called her back- no answer 

## 2013-01-15 NOTE — Telephone Encounter (Signed)
Patient states that Dr. Patsy Lager called her and left a message. Please return call [phone]

## 2013-01-17 NOTE — Telephone Encounter (Signed)
Called her back and was able to reach her to discuss her labs.  She has not yet received my letter.  Will increase her zocor to 20 mg (she will take 2 of her 10 mg tabs) and recheck LFTs in one month.  I have discussed with her GI who felt it would be safe to try increasing her cholesterol meds as long as we follow her LFTs closely.   She will come in for a lab visit only in one month, then plan LFTs/ FLP in 2 months.  She is still taking some tylenol for her hip pain and drinking alcohol some.    Continues to have slightly low white blood cell count- will refer to hematology as this has been persistent for over a year.    She had a regular coke and eggs prior to her blood draw- glucose of 112 is therefore ok.

## 2013-01-19 ENCOUNTER — Telehealth: Payer: Self-pay | Admitting: Hematology & Oncology

## 2013-01-19 NOTE — Telephone Encounter (Signed)
LVOM FOR PT TO RETURN CALL IN RE NP APPT.  °

## 2013-01-20 ENCOUNTER — Telehealth: Payer: Self-pay | Admitting: Hematology & Oncology

## 2013-01-20 NOTE — Telephone Encounter (Signed)
C/D 01/20/13 for appt. 03/02/13

## 2013-01-29 ENCOUNTER — Encounter (HOSPITAL_COMMUNITY): Payer: Self-pay | Admitting: Pharmacy Technician

## 2013-02-02 NOTE — Patient Instructions (Addendum)
20 NUSAYBA CADENAS  02/02/2013   Your procedure is scheduled on: 02/06/13  Report to [facility] at 12:05 PM.  Call this number if you have problems the morning of surgery [phone]: [phone]   Remember:   Do not eat food After Midnight, clear liquids from midnight until 8:35 am on 02/06/13 then nothing.      Take these medicines the morning of surgery with A SIP OF WATER: hydrocodone if needed, protonix   Do not wear jewelry, make-up or nail polish.  Do not wear lotions, powders, or perfumes. You may wear deodorant.  Do not shave 48 hours prior to surgery. Men may shave face and neck.  Do not bring valuables to the hospital.  Contacts, dentures or bridgework may not be worn into surgery.  Leave suitcase in the car. After surgery it may be brought to your room.  For patients admitted to the hospital, checkout time is 11:00 AM the day of discharge.    Please read over the following fact sheets that you were given: MRSA Information, blood fact sheet, clear liquids fact sheet Birdie Sons, RN  pre op nurse call if needed [phone]    FAILURE TO FOLLOW THESE INSTRUCTIONS MAY RESULT IN CANCELLATION OF YOUR SURGERY   Patient Signature: ___________________________________________

## 2013-02-02 NOTE — Progress Notes (Signed)
EKG 01/04/13 on EPIC

## 2013-02-03 ENCOUNTER — Encounter (HOSPITAL_COMMUNITY)
Admission: RE | Admit: 2013-02-03 | Discharge: 2013-02-03 | Disposition: A | Discharge status: Home / Self Care | Payer: BC Managed Care – PPO | Source: Ambulatory Visit | Attending: Orthopaedic Surgery | Admitting: Orthopaedic Surgery

## 2013-02-03 ENCOUNTER — Encounter (HOSPITAL_COMMUNITY): Payer: Self-pay

## 2013-02-03 ENCOUNTER — Ambulatory Visit (HOSPITAL_COMMUNITY)
Admission: RE | Admit: 2013-02-03 | Discharge: 2013-02-03 | Disposition: A | Discharge status: Home / Self Care | Payer: BC Managed Care – PPO | Source: Ambulatory Visit | Attending: Orthopaedic Surgery | Admitting: Orthopaedic Surgery

## 2013-02-03 DIAGNOSIS — I10 Essential (primary) hypertension: Secondary | ICD-10-CM | POA: Insufficient documentation

## 2013-02-03 DIAGNOSIS — Z01818 Encounter for other preprocedural examination: Secondary | ICD-10-CM | POA: Insufficient documentation

## 2013-02-03 DIAGNOSIS — F17200 Nicotine dependence, unspecified, uncomplicated: Secondary | ICD-10-CM | POA: Insufficient documentation

## 2013-02-03 HISTORY — DX: Anxiety disorder, unspecified: F41.9

## 2013-02-03 HISTORY — DX: Gastro-esophageal reflux disease without esophagitis: K21.9

## 2013-02-03 LAB — URINALYSIS, ROUTINE W REFLEX MICROSCOPIC
Leukocytes, UA: NEGATIVE
Nitrite: NEGATIVE
Specific Gravity, Urine: 1.021 (ref 1.005–1.030)
Urobilinogen, UA: 0.2 mg/dL (ref 0.0–1.0)

## 2013-02-03 LAB — CBC
Hemoglobin: 11.8 g/dL — ABNORMAL LOW (ref 12.0–15.0)
MCH: 30.6 pg (ref 26.0–34.0)
MCHC: 32.6 g/dL (ref 30.0–36.0)
MCV: 93.8 fL (ref 78.0–100.0)
Platelets: 205 10*3/uL (ref 150–400)
RBC: 3.86 MIL/uL — ABNORMAL LOW (ref 3.87–5.11)
RDW: 12.6 % (ref 11.5–15.5)

## 2013-02-03 LAB — SURGICAL PCR SCREEN
MRSA, PCR: NEGATIVE
Staphylococcus aureus: NEGATIVE

## 2013-02-03 LAB — BASIC METABOLIC PANEL
BUN: 7 mg/dL (ref 6–23)
CO2: 22 mEq/L (ref 19–32)
GFR calc non Af Amer: >90 mL/min (ref >90)
Glucose, Bld: 133 mg/dL — ABNORMAL HIGH (ref 70–99)
Potassium: 4.4 mEq/L (ref 3.5–5.1)
Sodium: 139 mEq/L (ref 135–145)

## 2013-02-06 ENCOUNTER — Ambulatory Visit (HOSPITAL_COMMUNITY): Payer: BC Managed Care – PPO

## 2013-02-06 ENCOUNTER — Encounter (HOSPITAL_COMMUNITY): Payer: Self-pay | Admitting: Registered Nurse

## 2013-02-06 ENCOUNTER — Ambulatory Visit (HOSPITAL_COMMUNITY): Payer: BC Managed Care – PPO | Admitting: Registered Nurse

## 2013-02-06 ENCOUNTER — Inpatient Hospital Stay (HOSPITAL_COMMUNITY)
Admission: RE | Admit: 2013-02-06 | Discharge: 2013-02-09 | DRG: 818 | Disposition: A | Discharge status: Home Health | Payer: BC Managed Care – PPO | Source: Ambulatory Visit | Attending: Orthopaedic Surgery | Admitting: Orthopaedic Surgery

## 2013-02-06 ENCOUNTER — Encounter (HOSPITAL_COMMUNITY)
Admission: RE | Disposition: A | Discharge status: Home Health | Payer: Self-pay | Source: Ambulatory Visit | Attending: Orthopaedic Surgery

## 2013-02-06 ENCOUNTER — Encounter (HOSPITAL_COMMUNITY): Payer: Self-pay | Admitting: *Deleted

## 2013-02-06 DIAGNOSIS — D62 Acute posthemorrhagic anemia: Secondary | ICD-10-CM | POA: Diagnosis not present

## 2013-02-06 DIAGNOSIS — I10 Essential (primary) hypertension: Secondary | ICD-10-CM | POA: Diagnosis present

## 2013-02-06 DIAGNOSIS — E785 Hyperlipidemia, unspecified: Secondary | ICD-10-CM | POA: Diagnosis present

## 2013-02-06 DIAGNOSIS — K219 Gastro-esophageal reflux disease without esophagitis: Secondary | ICD-10-CM | POA: Diagnosis present

## 2013-02-06 DIAGNOSIS — Z96649 Presence of unspecified artificial hip joint: Secondary | ICD-10-CM

## 2013-02-06 DIAGNOSIS — M25459 Effusion, unspecified hip: Secondary | ICD-10-CM | POA: Diagnosis present

## 2013-02-06 DIAGNOSIS — F329 Major depressive disorder, single episode, unspecified: Secondary | ICD-10-CM | POA: Diagnosis present

## 2013-02-06 DIAGNOSIS — M87052 Idiopathic aseptic necrosis of left femur: Secondary | ICD-10-CM

## 2013-02-06 DIAGNOSIS — R011 Cardiac murmur, unspecified: Secondary | ICD-10-CM | POA: Diagnosis present

## 2013-02-06 DIAGNOSIS — M87059 Idiopathic aseptic necrosis of unspecified femur: Secondary | ICD-10-CM | POA: Diagnosis present

## 2013-02-06 DIAGNOSIS — F3289 Other specified depressive episodes: Secondary | ICD-10-CM | POA: Diagnosis present

## 2013-02-06 DIAGNOSIS — M169 Osteoarthritis of hip, unspecified: Principal | ICD-10-CM | POA: Diagnosis present

## 2013-02-06 DIAGNOSIS — M1610 Unilateral primary osteoarthritis, unspecified hip: Principal | ICD-10-CM | POA: Diagnosis present

## 2013-02-06 DIAGNOSIS — F17200 Nicotine dependence, unspecified, uncomplicated: Secondary | ICD-10-CM | POA: Diagnosis present

## 2013-02-06 DIAGNOSIS — F411 Generalized anxiety disorder: Secondary | ICD-10-CM | POA: Diagnosis present

## 2013-02-06 HISTORY — PX: TOTAL HIP ARTHROPLASTY: SHX124

## 2013-02-06 SURGERY — ARTHROPLASTY, HIP, TOTAL, ANTERIOR APPROACH
Anesthesia: General | Site: Hip | Laterality: Left | Wound class: Clean

## 2013-02-06 MED ORDER — LIDOCAINE HCL (CARDIAC) 20 MG/ML IV SOLN
INTRAVENOUS | Status: DC | PRN
  Administered 2013-02-06: 80 mg via INTRAVENOUS

## 2013-02-06 MED ORDER — METHOCARBAMOL 500 MG PO TABS
500.0000 mg | ORAL_TABLET | Freq: Four times a day (QID) | ORAL | Status: DC | PRN
  Administered 2013-02-06 – 2013-02-09 (×2): 500 mg via ORAL
  Filled 2013-02-06 (×2): qty 1

## 2013-02-06 MED ORDER — HYDROMORPHONE HCL PF 1 MG/ML IJ SOLN
0.2500 mg | INTRAMUSCULAR | Status: DC | PRN
  Administered 2013-02-06 (×4): 0.5 mg via INTRAVENOUS

## 2013-02-06 MED ORDER — MENTHOL 3 MG MT LOZG: 1.0000 | LOZENGE | OROMUCOSAL | Status: DC | PRN

## 2013-02-06 MED ORDER — HYDROMORPHONE HCL PF 1 MG/ML IJ SOLN
INTRAMUSCULAR | Status: AC
  Filled 2013-02-06: qty 1

## 2013-02-06 MED ORDER — OXYCODONE HCL 5 MG PO TABS
5.0000 mg | ORAL_TABLET | ORAL | Status: DC | PRN
  Administered 2013-02-06 – 2013-02-09 (×14): 10 mg via ORAL
  Filled 2013-02-06 (×14): qty 2

## 2013-02-06 MED ORDER — PHENOL 1.4 % MT LIQD: 1.0000 | OROMUCOSAL | Status: DC | PRN

## 2013-02-06 MED ORDER — ONDANSETRON HCL 4 MG PO TABS: 4.0000 mg | ORAL_TABLET | Freq: Four times a day (QID) | ORAL | Status: DC | PRN

## 2013-02-06 MED ORDER — SODIUM CHLORIDE 0.9 % IV SOLN
INTRAVENOUS | Status: DC
  Administered 2013-02-06: 21:00:00 via INTRAVENOUS

## 2013-02-06 MED ORDER — 0.9 % SODIUM CHLORIDE (POUR BTL) OPTIME
TOPICAL | Status: DC | PRN
  Administered 2013-02-06: 1000 mL

## 2013-02-06 MED ORDER — LISINOPRIL-HYDROCHLOROTHIAZIDE 20-12.5 MG PO TABS: 1.0000 | ORAL_TABLET | Freq: Every morning | ORAL | Status: DC

## 2013-02-06 MED ORDER — METHOCARBAMOL 100 MG/ML IJ SOLN
500.0000 mg | Freq: Four times a day (QID) | INTRAVENOUS | Status: DC | PRN
  Administered 2013-02-06: 500 mg via INTRAVENOUS
  Filled 2013-02-06: qty 5

## 2013-02-06 MED ORDER — DIPHENHYDRAMINE HCL 12.5 MG/5ML PO ELIX: 12.5000 mg | ORAL_SOLUTION | ORAL | Status: DC | PRN

## 2013-02-06 MED ORDER — DIPHENOXYLATE-ATROPINE 2.5-0.025 MG PO TABS: 1.0000 | ORAL_TABLET | Freq: Three times a day (TID) | ORAL | Status: DC | PRN

## 2013-02-06 MED ORDER — METOCLOPRAMIDE HCL 10 MG PO TABS
5.0000 mg | ORAL_TABLET | Freq: Three times a day (TID) | ORAL | Status: DC | PRN
Start: 2013-02-06 — End: 2013-02-09

## 2013-02-06 MED ORDER — MIDAZOLAM HCL 5 MG/5ML IJ SOLN
INTRAMUSCULAR | Status: DC | PRN
  Administered 2013-02-06: 2 mg via INTRAVENOUS

## 2013-02-06 MED ORDER — LACTATED RINGERS IV SOLN
INTRAVENOUS | Status: DC
Start: 2013-02-06 — End: 2013-02-06
  Administered 2013-02-06: 1 via INTRAVENOUS
  Administered 2013-02-06: 17:00:00 via INTRAVENOUS
  Administered 2013-02-06: 1000 mL via INTRAVENOUS

## 2013-02-06 MED ORDER — SODIUM CHLORIDE 0.9 % IR SOLN
Status: DC | PRN
  Administered 2013-02-06: 1000 mL

## 2013-02-06 MED ORDER — LACTATED RINGERS IV SOLN: INTRAVENOUS | Status: DC

## 2013-02-06 MED ORDER — NEOSTIGMINE METHYLSULFATE 1 MG/ML IJ SOLN
INTRAMUSCULAR | Status: DC | PRN
  Administered 2013-02-06: 4 mg via INTRAVENOUS

## 2013-02-06 MED ORDER — ALUM & MAG HYDROXIDE-SIMETH 200-200-20 MG/5ML PO SUSP: 30.0000 mL | ORAL | Status: DC | PRN

## 2013-02-06 MED ORDER — ACETAMINOPHEN 650 MG RE SUPP: 650.0000 mg | Freq: Four times a day (QID) | RECTAL | Status: DC | PRN

## 2013-02-06 MED ORDER — LABETALOL HCL 5 MG/ML IV SOLN
INTRAVENOUS | Status: DC | PRN
  Administered 2013-02-06: 5 mg via INTRAVENOUS
  Administered 2013-02-06 (×2): 2.5 mg via INTRAVENOUS

## 2013-02-06 MED ORDER — CEFAZOLIN SODIUM-DEXTROSE 2-3 GM-% IV SOLR
2.0000 g | INTRAVENOUS | Status: AC
  Administered 2013-02-06: 2 g via INTRAVENOUS

## 2013-02-06 MED ORDER — LISINOPRIL 20 MG PO TABS
20.0000 mg | ORAL_TABLET | Freq: Every day | ORAL | Status: DC
  Administered 2013-02-07: 20 mg via ORAL
  Filled 2013-02-06 (×3): qty 1

## 2013-02-06 MED ORDER — HYDROMORPHONE HCL PF 1 MG/ML IJ SOLN
1.0000 mg | INTRAMUSCULAR | Status: DC | PRN
  Administered 2013-02-06 – 2013-02-07 (×3): 1 mg via INTRAVENOUS
  Filled 2013-02-06 (×3): qty 1

## 2013-02-06 MED ORDER — ONDANSETRON HCL 4 MG/2ML IJ SOLN: 4.0000 mg | Freq: Four times a day (QID) | INTRAMUSCULAR | Status: DC | PRN

## 2013-02-06 MED ORDER — ROCURONIUM BROMIDE 100 MG/10ML IV SOLN
INTRAVENOUS | Status: DC | PRN
  Administered 2013-02-06: 10 mg via INTRAVENOUS
  Administered 2013-02-06: 40 mg via INTRAVENOUS
  Administered 2013-02-06: 10 mg via INTRAVENOUS

## 2013-02-06 MED ORDER — ZOLPIDEM TARTRATE 5 MG PO TABS: 5.0000 mg | ORAL_TABLET | Freq: Every evening | ORAL | Status: DC | PRN

## 2013-02-06 MED ORDER — METOCLOPRAMIDE HCL 5 MG/ML IJ SOLN: 5.0000 mg | Freq: Three times a day (TID) | INTRAMUSCULAR | Status: DC | PRN

## 2013-02-06 MED ORDER — RIVAROXABAN 10 MG PO TABS
10.0000 mg | ORAL_TABLET | Freq: Every day | ORAL | Status: DC
  Administered 2013-02-07 – 2013-02-09 (×3): 10 mg via ORAL
  Filled 2013-02-06 (×5): qty 1

## 2013-02-06 MED ORDER — ACETAMINOPHEN 325 MG PO TABS
650.0000 mg | ORAL_TABLET | Freq: Four times a day (QID) | ORAL | Status: DC | PRN
  Administered 2013-02-08: 650 mg via ORAL
  Filled 2013-02-06: qty 2

## 2013-02-06 MED ORDER — FERROUS SULFATE 325 (65 FE) MG PO TABS
325.0000 mg | ORAL_TABLET | Freq: Three times a day (TID) | ORAL | Status: DC
  Administered 2013-02-06 – 2013-02-08 (×7): 325 mg via ORAL
  Filled 2013-02-06 (×11): qty 1

## 2013-02-06 MED ORDER — PROMETHAZINE HCL 25 MG/ML IJ SOLN: 6.2500 mg | INTRAMUSCULAR | Status: DC | PRN

## 2013-02-06 MED ORDER — POLYETHYLENE GLYCOL 3350 17 G PO PACK: 17.0000 g | PACK | Freq: Every day | ORAL | Status: DC | PRN

## 2013-02-06 MED ORDER — DOCUSATE SODIUM 100 MG PO CAPS
100.0000 mg | ORAL_CAPSULE | Freq: Two times a day (BID) | ORAL | Status: DC
  Administered 2013-02-06 – 2013-02-09 (×4): 100 mg via ORAL

## 2013-02-06 MED ORDER — PANTOPRAZOLE SODIUM 40 MG PO TBEC
40.0000 mg | DELAYED_RELEASE_TABLET | Freq: Every morning | ORAL | Status: DC
  Administered 2013-02-07 – 2013-02-09 (×3): 40 mg via ORAL
  Filled 2013-02-06 (×4): qty 1

## 2013-02-06 MED ORDER — SIMVASTATIN 10 MG PO TABS
10.0000 mg | ORAL_TABLET | Freq: Every day | ORAL | Status: DC
  Administered 2013-02-06 – 2013-02-08 (×3): 10 mg via ORAL
  Filled 2013-02-06 (×4): qty 1

## 2013-02-06 MED ORDER — FENTANYL CITRATE 0.05 MG/ML IJ SOLN
INTRAMUSCULAR | Status: DC | PRN
  Administered 2013-02-06: 100 ug via INTRAVENOUS
  Administered 2013-02-06 (×2): 50 ug via INTRAVENOUS

## 2013-02-06 MED ORDER — ACETAMINOPHEN 10 MG/ML IV SOLN
INTRAVENOUS | Status: AC
  Filled 2013-02-06: qty 100

## 2013-02-06 MED ORDER — CEFAZOLIN SODIUM 1-5 GM-% IV SOLN
1.0000 g | Freq: Four times a day (QID) | INTRAVENOUS | Status: AC
  Administered 2013-02-06 – 2013-02-07 (×2): 1 g via INTRAVENOUS
  Filled 2013-02-06 (×2): qty 50

## 2013-02-06 MED ORDER — CEFAZOLIN SODIUM-DEXTROSE 2-3 GM-% IV SOLR
INTRAVENOUS | Status: AC
  Filled 2013-02-06: qty 50

## 2013-02-06 MED ORDER — GLYCOPYRROLATE 0.2 MG/ML IJ SOLN
INTRAMUSCULAR | Status: DC | PRN
  Administered 2013-02-06: 0.4 mg via INTRAVENOUS

## 2013-02-06 MED ORDER — HYDROMORPHONE HCL PF 1 MG/ML IJ SOLN
INTRAMUSCULAR | Status: AC
  Administered 2013-02-06: 1 mg
  Filled 2013-02-06: qty 1

## 2013-02-06 MED ORDER — HYDROCHLOROTHIAZIDE 12.5 MG PO CAPS
12.5000 mg | ORAL_CAPSULE | Freq: Every day | ORAL | Status: DC
  Administered 2013-02-07: 12.5 mg via ORAL
  Filled 2013-02-06 (×3): qty 1

## 2013-02-06 MED ORDER — ACETAMINOPHEN 10 MG/ML IV SOLN
INTRAVENOUS | Status: DC | PRN
  Administered 2013-02-06: 1000 mg via INTRAVENOUS

## 2013-02-06 MED ORDER — PROPOFOL 10 MG/ML IV BOLUS
INTRAVENOUS | Status: DC | PRN
  Administered 2013-02-06: 150 mg via INTRAVENOUS

## 2013-02-06 MED ORDER — OXYCODONE HCL ER 10 MG PO T12A
10.0000 mg | EXTENDED_RELEASE_TABLET | Freq: Two times a day (BID) | ORAL | Status: DC
  Administered 2013-02-06 – 2013-02-08 (×5): 10 mg via ORAL
  Filled 2013-02-06 (×5): qty 1

## 2013-02-06 MED ORDER — HYDROMORPHONE HCL PF 1 MG/ML IJ SOLN
INTRAMUSCULAR | Status: DC | PRN
  Administered 2013-02-06: 1 mg via INTRAVENOUS
  Administered 2013-02-06 (×2): 0.5 mg via INTRAVENOUS

## 2013-02-06 MED ORDER — ONDANSETRON HCL 4 MG/2ML IJ SOLN
INTRAMUSCULAR | Status: DC | PRN
  Administered 2013-02-06: 4 mg via INTRAVENOUS

## 2013-02-06 MED ORDER — DEXAMETHASONE SODIUM PHOSPHATE 10 MG/ML IJ SOLN
INTRAMUSCULAR | Status: DC | PRN
  Administered 2013-02-06: 10 mg via INTRAVENOUS

## 2013-02-06 MED ORDER — BISACODYL 5 MG PO TBEC: 5.0000 mg | DELAYED_RELEASE_TABLET | Freq: Every day | ORAL | Status: DC | PRN

## 2013-02-06 SURGICAL SUPPLY — 41 items
ADH SKN CLS APL DERMABOND .7 (GAUZE/BANDAGES/DRESSINGS) ×1
BAG SPEC THK2 15X12 ZIP CLS (MISCELLANEOUS)
BAG ZIPLOCK 12X15 (MISCELLANEOUS) ×2 IMPLANT
BLADE SAW SGTL 18X1.27X75 (BLADE) ×2 IMPLANT
CAPT HIP PF COP ×1 IMPLANT
CELLS DAT CNTRL 66122 CELL SVR (MISCELLANEOUS) ×1 IMPLANT
CLOTH BEACON ORANGE TIMEOUT ST (SAFETY) ×2 IMPLANT
DERMABOND ADVANCED (GAUZE/BANDAGES/DRESSINGS) ×1
DERMABOND ADVANCED .7 DNX12 (GAUZE/BANDAGES/DRESSINGS) ×1 IMPLANT
DRAPE C-ARM 42X120 X-RAY (DRAPES) ×2 IMPLANT
DRAPE STERI IOBAN 125X83 (DRAPES) ×2 IMPLANT
DRAPE U-SHAPE 47X51 STRL (DRAPES) ×6 IMPLANT
DRSG AQUACEL AG ADV 3.5X10 (GAUZE/BANDAGES/DRESSINGS) ×2 IMPLANT
DURAPREP 26ML APPLICATOR (WOUND CARE) ×2 IMPLANT
ELECT BLADE TIP CTD 4 INCH (ELECTRODE) ×2 IMPLANT
ELECT REM PT RETURN 9FT ADLT (ELECTROSURGICAL) ×2
ELECTRODE REM PT RTRN 9FT ADLT (ELECTROSURGICAL) ×1 IMPLANT
ELIMINATOR HOLE APEX DEPUY (Hips) ×1 IMPLANT
FACESHIELD LNG OPTICON STERILE (SAFETY) ×7 IMPLANT
GLOVE BIO SURGEON STRL SZ7.5 (GLOVE) ×2 IMPLANT
GLOVE BIOGEL PI IND STRL 8 (GLOVE) ×2 IMPLANT
GLOVE BIOGEL PI INDICATOR 8 (GLOVE) ×1
GLOVE ECLIPSE 8.0 STRL XLNG CF (GLOVE) ×1 IMPLANT
GOWN STRL REIN XL XLG (GOWN DISPOSABLE) ×5 IMPLANT
HANDPIECE INTERPULSE COAX TIP (DISPOSABLE) ×2
KIT BASIN OR (CUSTOM PROCEDURE TRAY) ×2 IMPLANT
PACK TOTAL JOINT (CUSTOM PROCEDURE TRAY) ×2 IMPLANT
PADDING CAST COTTON 6X4 STRL (CAST SUPPLIES) ×2 IMPLANT
RETRACTOR WND ALEXIS 18 MED (MISCELLANEOUS) ×1 IMPLANT
RTRCTR WOUND ALEXIS 18CM MED (MISCELLANEOUS) ×2
SET HNDPC FAN SPRY TIP SCT (DISPOSABLE) ×1 IMPLANT
SUT ETHIBOND NAB CT1 #1 30IN (SUTURE) ×3 IMPLANT
SUT ETHILON 3 0 PS 1 (SUTURE) ×1 IMPLANT
SUT MNCRL AB 4-0 PS2 18 (SUTURE) ×2 IMPLANT
SUT VIC AB 0 CT1 36 (SUTURE) ×2 IMPLANT
SUT VIC AB 1 CT1 36 (SUTURE) ×3 IMPLANT
SUT VIC AB 2-0 CT1 27 (SUTURE) ×4
SUT VIC AB 2-0 CT1 TAPERPNT 27 (SUTURE) ×2 IMPLANT
TOWEL OR 17X26 10 PK STRL BLUE (TOWEL DISPOSABLE) ×2 IMPLANT
TOWEL OR NON WOVEN STRL DISP B (DISPOSABLE) ×1 IMPLANT
TRAY FOLEY CATH 14FRSI W/METER (CATHETERS) ×2 IMPLANT

## 2013-02-06 NOTE — Transfer of Care (Signed)
Immediate Anesthesia Transfer of Care Note  Patient: Lisa [name]  Procedure(s) Performed: Procedure(s): LEFT TOTAL HIP ARTHROPLASTY ANTERIOR APPROACH (Left)  Patient Location: PACU  Anesthesia Type:General  Level of Consciousness: awake, alert , sedated and patient cooperative  Airway & Oxygen Therapy: Patient Spontanous Breathing and Patient connected to face mask oxygen  Post-op Assessment: Report given to PACU RN and Post -op Vital signs reviewed and stable  Post vital signs: Reviewed and stable  Complications: No apparent anesthesia complications

## 2013-02-06 NOTE — Anesthesia Preprocedure Evaluation (Addendum)
Anesthesia Evaluation  Patient identified by MRN, date of birth, ID band Patient awake    Reviewed: Allergy & Precautions, H&P , NPO status , Patient's Chart, lab work & pertinent test results  Airway Mallampati: II TM Distance: >3 FB Neck ROM: Full    Dental  (+) Teeth Intact and Dental Advisory Given   Pulmonary Current Smoker,  breath sounds clear to auscultation  Pulmonary exam normal       Cardiovascular hypertension, + Valvular Problems/Murmurs Rate:Normal     Neuro/Psych Anxiety Depression negative neurological ROS  negative psych ROS   GI/Hepatic negative GI ROS, Neg liver ROS, GERD-  ,  Endo/Other  negative endocrine ROS  Renal/GU negative Renal ROS  negative genitourinary   Musculoskeletal negative musculoskeletal ROS (+)   Abdominal   Peds negative pediatric ROS (+)  Hematology negative hematology ROS (+)   Anesthesia Other Findings   Reproductive/Obstetrics                         Anesthesia Physical Anesthesia Plan  ASA: II  Anesthesia Plan: General   Post-op Pain Management:    Induction: Intravenous  Airway Management Planned: Oral ETT  Additional Equipment:   Intra-op Plan:   Post-operative Plan: Extubation in OR  Informed Consent: I have reviewed the patients History and Physical, chart, labs and discussed the procedure including the risks, benefits and alternatives for the proposed anesthesia with the patient or authorized representative who has indicated his/her understanding and acceptance.   Dental advisory given  Plan Discussed with: CRNA  Anesthesia Plan Comments:         Anesthesia Quick Evaluation

## 2013-02-06 NOTE — Preoperative (Signed)
Beta Blockers   Reason not to administer Beta Blockers:Not Applicable 

## 2013-02-06 NOTE — H&P (Signed)
TOTAL HIP ADMISSION H&P  Patient is admitted for left total hip arthroplasty.  Subjective:  Chief Complaint: left hip pain  HPI: Lisa [name], 42 y.o. female, has a history of pain and functional disability in the left hip(s) due to AVN and patient has failed non-surgical conservative treatments for greater than 12 weeks to include NSAID's and/or analgesics, corticosteriod injections, use of assistive devices and activity modification.  Onset of symptoms was gradual starting 1 years ago with rapidlly worsening course since that time.The patient noted no past surgery on the left hip(s).  Patient currently rates pain in the left hip at 10 out of 10 with activity. Patient has worsening of pain with activity and weight bearing, pain that interfers with activities of daily living and pain with passive range of motion. Patient has evidence of subchondral cysts and joint space narrowing by imaging studies. This condition presents safety issues increasing the risk of falls.  There is no current active infection.  Patient Active Problem List   Diagnosis Date Noted  . Avascular necrosis of bone of left hip 02/06/2013  . Hyperlipidemia 10/13/2012  . HTN (hypertension) 10/13/2012   Past Medical History  Diagnosis Date  . Hyperlipidemia   . Hypertension   . Ulcer     x2  . Heart murmur   . Anxiety 20 years ago  . Depression 20 years ago  . GERD (gastroesophageal reflux disease)   . Anemia     hx of    Past Surgical History  Procedure Laterality Date  . No past surgeries    . Esophagogastroduodenoscopy endoscopy      at least 4    No prescriptions prior to admission   Allergies  Allergen Reactions  . Nsaids Other (See Comments)    ulcers    History  Substance Use Topics  . Smoking status: Current Every Day Smoker -- 0.25 packs/day for 20 years    Types: Cigarettes  . Smokeless tobacco: Never Used  . Alcohol Use: 0.0 oz/week     Comment: few glasses of wine a week    Family  History  Problem Relation Age of Onset  . Adopted: Yes     Review of Systems  Musculoskeletal: Positive for joint pain.  All other systems reviewed and are negative.    Objective:  Physical Exam  Constitutional: She is oriented to person, place, and time. She appears well-developed and well-nourished.  HENT:  Head: Normocephalic and atraumatic.  Eyes: EOM are normal. Pupils are equal, round, and reactive to light.  Neck: Normal range of motion. Neck supple.  Cardiovascular: Normal rate and regular rhythm.   Respiratory: Effort normal and breath sounds normal.  GI: Soft. Bowel sounds are normal.  Musculoskeletal:       Left hip: She exhibits decreased range of motion, decreased strength, bony tenderness and crepitus.  Neurological: She is alert and oriented to person, place, and time.  Skin: Skin is warm and dry.  Psychiatric: She has a normal mood and affect.    Vital signs in last 24 hours:    Labs:   Estimated body mass index is 23.43 kg/(m^2) as calculated from the following:   Height as of 10/11/12: 5\' 7"  (1.702 m).   Weight as of 10/11/12: 67.858 kg (149 lb 9.6 oz).   Imaging Review Plain radiographs demonstrate severe avascular necrosis of the left hip(s). The bone quality appears to be good for age and reported activity level.  Assessment/Plan:  End stage arthritis, left hip(s)  The patient history, physical examination, clinical judgement of the provider and imaging studies are consistent with end stage degenerative joint disease of the left hip(s) and total hip arthroplasty is deemed medically necessary. The treatment options including medical management, injection therapy, arthroscopy and arthroplasty were discussed at length. The risks and benefits of total hip arthroplasty were presented and reviewed. The risks due to aseptic loosening, infection, stiffness, dislocation/subluxation,  thromboembolic complications and other imponderables were discussed.  The  patient acknowledged the explanation, agreed to proceed with the plan and consent was signed. Patient is being admitted for inpatient treatment for surgery, pain control, PT, OT, prophylactic antibiotics, VTE prophylaxis, progressive ambulation and ADL's and discharge planning.The patient is planning to be discharged home with home health services

## 2013-02-06 NOTE — Brief Op Note (Signed)
02/06/2013  5:53 PM  PATIENT:  Lisa [name]  42 y.o. female  PRE-OPERATIVE DIAGNOSIS:  Avascular necrosis left hip  POST-OPERATIVE DIAGNOSIS:  Avascular necrosis left hip  PROCEDURE:  Procedure(s): LEFT TOTAL HIP ARTHROPLASTY ANTERIOR APPROACH (Left)  SURGEON:  Surgeon(s) and Role:    * Kathryne Hitch, MD - Primary  PHYSICIAN ASSISTANT:   ASSISTANTS: none   ANESTHESIA:   general  EBL:  Total I/O In: 2900 [I.V.:2900] Out: 550 [Urine:150; Blood:400]  BLOOD ADMINISTERED:none  DRAINS: none   LOCAL MEDICATIONS USED:  NONE  SPECIMEN:  No Specimen  DISPOSITION OF SPECIMEN:  N/A  COUNTS:  YES  TOURNIQUET:  * No tourniquets in log *  DICTATION: .Other Dictation: Dictation Number [id number]  PLAN OF CARE: Admit to inpatient   PATIENT DISPOSITION:  PACU - hemodynamically stable.   Delay start of Pharmacological VTE agent (>24hrs) due to surgical blood loss or risk of bleeding: no

## 2013-02-07 LAB — CBC
HCT: 22.1 % — ABNORMAL LOW (ref 36.0–46.0)
Hemoglobin: 7.3 g/dL — ABNORMAL LOW (ref 12.0–15.0)
MCHC: 33 g/dL (ref 30.0–36.0)
RDW: 12.5 % (ref 11.5–15.5)
WBC: 6.3 10*3/uL (ref 4.0–10.5)

## 2013-02-07 LAB — BASIC METABOLIC PANEL
Chloride: 98 mEq/L (ref 96–112)
GFR calc Af Amer: >90 mL/min (ref >90)
GFR calc non Af Amer: >90 mL/min (ref >90)
Potassium: 3.7 mEq/L (ref 3.5–5.1)
Sodium: 133 mEq/L — ABNORMAL LOW (ref 135–145)

## 2013-02-07 NOTE — Care Management Note (Signed)
Cm spoke with patient at bedside concerning discharge planning. Per pt choice [facility] to provide [facility] services upon discharge. Pt states friend will assist in home care. Pt request [facility]. Awaiting MD orders for DME & HHPT. No other needs identified.   Roxy Manns Miranda Frese,RN,BSN [phone]

## 2013-02-07 NOTE — Op Note (Signed)
NAMEMADALENA, Lisa [name].:  [id number]  MEDICAL RECORD NO.:  [id number]  LOCATION:  1618                         FACILITY:  [facility]  PHYSICIAN:  Vanita Panda. Magnus Ivan, M.D.DATE OF BIRTH:  12-Sep-1970  DATE OF PROCEDURE:  02/06/2013 DATE OF DISCHARGE:                              OPERATIVE REPORT   PREOPERATIVE DIAGNOSIS:  Left hip severe avascular necrosis.  POSTOPERATIVE DIAGNOSIS:  Left hip severe avascular necrosis.  PROCEDURE:  Left total hip arthroplasty, direct anterior approach.  IMPLANTS:  DePuy sector [place] acetabular component size 50, size 32+ 4 neutral polyethylene liner, size 9 Corail femoral component with standard offset, size 32+ 1 ceramic hip ball.  SURGEON:  Vanita Panda. Magnus Ivan, M.D.  ANESTHESIA:  General.  ESTIMATED BLOOD LOSS:  500 to 700 mL.  ANTIBIOTICS:  2 g IV Ancef.  COMPLICATIONS:  None.  INDICATIONS:  Lisa [name] is a 42 year old with bilateral hip avascular necrosis with the left being near its end-stage.  She has debilitating pain in that left hip and at this point wishes to proceed with a total hip arthroplasty.  The risks and benefits of the surgery have been explained to her in detail.  She understands the reason behind proceeding with surgery and understands her hip disease.  PROCEDURE DESCRIPTION:  After informed consent was obtained, appropriate left hip was marked.  She was brought to the operating room.  General anesthesia was obtained while she was on her stretcher.  Foley catheter was placed and then traction boots were placed on both her feet.  She was next placed supine on the Hana fracture table with the perineal post in place and both legs in inline skeletal traction, but no traction applied.  Her left operative hip was then prepped and draped with DuraPrep and sterile drapes.  Time-out was called.  She was identified as the correct patient, correct left hip.  I then made an incision posterior and inferior to  the anterior-superior iliac spine and carried this obliquely down the leg.  I dissected down to the tensor fascia lata muscle, and the tensor fascia was divided longitudinally.  I then proceeded with a direct anterior approach to the hip.  A Cobra retractor was placed around the lateral neck and up underneath the rectus femoris, a medial retractor was placed.  I cauterized the lateral femoral circumflex vessels and then opened up the hip capsule and found a large joint effusion.  I placed the Cobra retractors within the hip capsule. I then made my femoral neck cut just proximal to the lesser trochanter using oscillating saw and completed this with an osteotome.  I placed a corkscrew guide in the femoral head and removed the femoral head in its entirety and found incredibly soft cartilage planning ahead the cartilage was flaking off.  I then cleaned the acetabulum debris and began reaming from size 42 reamer up to a size 50 in 2 mm increments with all reamers placed under direct visualization and the last reamer placed under direct fluoroscopy to obtain my depth of reaming, my inclination, and anteversion.  Once I was pleased with this, I placed the real DePuy Sector Gription acetabular component size 50, and apex  hole eliminator guide.  I was pleased with this placement and under direct fluoroscopy as well, and I placed the real 32+ 4 neutral polyethylene liner.  Attention was then turned to the femur with the leg externally rotated to 95 degrees extended and adducted.  I placed a medial retractor medially, and a Hohmann retractor behind the greater trochanter.  I released the lateral joint capsule, then used a box cutting guide to open up the femoral canal and a rongeur to lateralize the broach with the edges size 8 and size 9 broach and size 9 was felt to be stable.  Then trialed with standard neck and a 32+ 1 hip ball.  I brought the leg back over and up with traction and internal  rotation, reduced in the acetabulum and was stable with internal and external rotation.  Had minimal shuck and her leg lengths were measured equal under direct fluoroscopy.  We then dislocated the hip and removed the trial components and placed the real Corail component size 9 and the real 32+ 1 ceramic hip ball and again reduced.  The acetabulum was stable.  I then copiously irrigated the joint with normal saline solution and using pulsatile lavage, closed the joint capsule with interrupted #1 Ethibond suture, followed by a running #1 Vicryl in the tensor fascia, 0 Vicryl in the deep tissue, 2-0 Vicryl in subcutaneous tissue, 4-0 Monocryl subcuticular stitch and Dermabond on the skin.  An Aquacel dressing was applied.  She was then taken off the Hana table, awakened, extubated and taken to recovery room in stable condition.  All final counts were correct.  There were no complications noted.     Vanita Panda. Magnus Ivan, M.D.     CYB/MEDQ  D:  02/06/2013  T:  02/07/2013  Job:  [id number]

## 2013-02-07 NOTE — Progress Notes (Signed)
Subjective: 1 Day Post-Op Procedure(s) (LRB): LEFT TOTAL HIP ARTHROPLASTY ANTERIOR APPROACH (Left) Patient reports pain as moderate.  Very low hemoglobin with some symptoms of acute blood loss anemia.  Objective: Vital signs in last 24 hours: Temp:  [97.4 F (36.3 C)-98.3 F (36.8 C)] 98.3 F (36.8 C) (06/28 1001) Pulse Rate:  [76-119] 99 (06/28 1001) Resp:  [8-16] 16 (06/28 1001) BP: (98-144)/(66-93) 98/66 mmHg (06/28 1001) SpO2:  [100 %] 100 % (06/28 1001) FiO2 (%):  [100 %] 100 % (06/27 1846) Weight:  [67.132 kg (148 lb)] 67.132 kg (148 lb) (06/27 1933)  Intake/Output from previous day: 06/27 0701 - 06/28 0700 In: 3875 [I.V.:3775; IV Piggyback:100] Out: 2750 [Urine:2350; Blood:400] Intake/Output this shift: Total I/O In: 240 [P.O.:240] Out: 1125 [Urine:1125]   Recent Labs  02/07/13 0505  HGB 7.3*    Recent Labs  02/07/13 0505  WBC 6.3  RBC 2.39*  HCT 22.1*  PLT 166    Recent Labs  02/07/13 0505  NA 133*  K 3.7  CL 98  CO2 23  BUN 6  CREATININE 0.41*  GLUCOSE 170*  CALCIUM 8.0*   No results found for this basename: LABPT, INR,  in the last 72 hours  Intact pulses distally Dorsiflexion/Plantar flexion intact Incision: dressing C/D/I Compartment soft  Assessment/Plan: 1 Day Post-Op Procedure(s) (LRB): LEFT TOTAL HIP ARTHROPLASTY ANTERIOR APPROACH (Left) Up with therapy Follow H&H closely.  May need transfusion.  Kathryne Hitch 02/07/2013, 12:54 PM

## 2013-02-07 NOTE — Evaluation (Signed)
Occupational Therapy Evaluation Patient Details Name: Lisa [name] MRN: [id number] DOB: March 16, 1971 Today's Date: 02/07/2013 Time: 2130-8657 OT Time Calculation (min): 25 min  OT Assessment / Plan / Recommendation History of present illness Left total hip arthroplasty, direct anterior approach   Clinical Impression   Pt presents to OT s/p LTHA (anterior). Pt will benefit from skilled OT to increase I with ADL activity and return to PLOF    OT Assessment  Patient needs continued OT Services    Follow Up Recommendations  Home health OT       Equipment Recommendations  3 in 1 bedside comode       Frequency  Min 2X/week    Precautions / Restrictions Precautions Precautions: Anterior Hip Restrictions Weight Bearing Restrictions: No       ADL  Upper Body Dressing: Performed;Supervision/safety Where Assessed - Upper Body Dressing: Unsupported sitting Lower Body Dressing: Simulated;Moderate assistance Where Assessed - Lower Body Dressing: Supported sit to stand Toilet Transfer: Mining engineer Method: Sit to stand;Other (comment) (bed to chair) Transfers/Ambulation Related to ADLs: Pt did become dizzy in the hall walking with PT / OT.  After pt returned to chair- OT explained/demonstrated use of 3 n 1 om preparation for when pt needed to use the restroom.    OT Diagnosis: Generalized weakness  OT Problem List: Decreased strength OT Treatment Interventions: Self-care/ADL training;Patient/family education;DME and/or AE instruction   OT Goals(Current goals can be found in the care plan section) Acute Rehab OT Goals OT Goal Formulation: With patient Time For Goal Achievement: 02/21/13 Potential to Achieve Goals: Good ADL Goals Pt Will Perform Grooming: with modified independence Pt Will Perform Lower Body Dressing: with modified independence Pt Will Transfer to Toilet: with modified independence Pt Will Perform Toileting - Clothing Manipulation  and hygiene: with modified independence Pt Will Perform Tub/Shower Transfer: with modified independence  Visit Information  Last OT Received On: 02/07/13 Assistance Needed: +1 History of Present Illness: Left total hip arthroplasty, direct anterior approach       Prior Functioning     Home Living Family/patient expects to be discharged to:: Private residence Living Arrangements: Alone Type of Home: Apartment Home Layout: One level Home Equipment: Cane - single point Prior Function Level of Independence: Independent Communication Communication: No difficulties         Vision/Perception Vision - History Baseline Vision: Wears glasses all the time Patient Visual Report: No change from baseline   Cognition  Cognition Arousal/Alertness: Awake/alert Behavior During Therapy: WFL for tasks assessed/performed Overall Cognitive Status: Within Functional Limits for tasks assessed    Extremity/Trunk Assessment Upper Extremity Assessment Upper Extremity Assessment: Overall WFL for tasks assessed Lower Extremity Assessment Lower Extremity Assessment: Overall WFL for tasks assessed     Mobility Bed Mobility Bed Mobility: Supine to Sit Supine to Sit: 4: Min assist Transfers Transfers: Sit to Stand;Stand to Sit Sit to Stand: From bed;4: Min assist Stand to Sit: To chair/3-in-1;4: Min assist           End of Session OT - End of Session Activity Tolerance: Patient tolerated treatment well Patient left: in chair with call bell within reach Nurse Communication: Mobility status  GO     Alba Cory 02/07/2013, 11:50 AM

## 2013-02-07 NOTE — Progress Notes (Signed)
Physical Therapy Treatment Patient Details Name: Lisa [name] MRN: [id number] DOB: 07/25/1971 Today's Date: 02/07/2013 Time: 4696-2952 PT Time Calculation (min): 16 min  PT Assessment / Plan / Recommendation  PT Comments   Pt moves well but continues ltd by dizziness with move to standing.  Pt BP 88/60 in standing with c/o dizziness.  Follow Up Recommendations  Home health PT     Does the patient have the potential to tolerate intense rehabilitation     Barriers to Discharge        Equipment Recommendations  Rolling walker with 5" wheels    Recommendations for Other Services OT consult  Frequency 7X/week   Progress towards PT Goals Progress towards PT goals: Progressing toward goals  Plan Current plan remains appropriate    Precautions / Restrictions Precautions Precautions: Fall Restrictions Weight Bearing Restrictions: No Other Position/Activity Restrictions: WBAT   Pertinent Vitals/Pain 4/10; premed, ice packs provided    Mobility  Bed Mobility Bed Mobility: Sit to Supine;Supine to Sit Supine to Sit: 4: Min assist Sit to Supine: 4: Min assist Details for Bed Mobility Assistance: cues for sequence and use of R LE to self assist Transfers Transfers: Sit to Stand;Stand to Sit Sit to Stand: From bed;4: Min assist Stand to Sit: To bed;4: Min assist Details for Transfer Assistance: cues for LE management and use of UEs to self assist Ambulation/Gait Ambulation/Gait Assistance: 4: Min assist Ambulation Distance (Feet): 3 Feet Assistive device: Rolling walker Ambulation/Gait Assistance Details: cues for posture, sequence and position from RW Gait Pattern: Step-to pattern;Antalgic General Gait Details: LTd by dizziness - BP 88/60 - RN aware Stairs: No    Exercises Total Joint Exercises Ankle Circles/Pumps: AROM;Both;15 reps;Supine Quad Sets: AROM;10 reps;Both;Supine Heel Slides: AAROM;15 reps;Supine;Left Hip ABduction/ADduction: AAROM;15 reps;Supine;Left   PT  Diagnosis: Difficulty walking  PT Problem List: Decreased strength;Decreased range of motion;Decreased activity tolerance;Decreased mobility;Decreased knowledge of use of DME;Pain PT Treatment Interventions: DME instruction;Gait training;Stair training;Functional mobility training;Therapeutic activities;Therapeutic exercise;Patient/family education   PT Goals (current goals can now be found in the care plan section) Acute Rehab PT Goals Patient Stated Goal: Back to work in less than 6 weeks PT Goal Formulation: With patient Time For Goal Achievement: 02/11/13 Potential to Achieve Goals: Good  Visit Information  Last PT Received On: 02/07/13 Assistance Needed: +1 PT/OT Co-Evaluation/Treatment: Yes History of Present Illness: Left total hip arthroplasty, direct anterior approach    Subjective Data  Subjective: I'm feeling a little dizzy again Patient Stated Goal: Back to work in less than 6 weeks   Cognition  Cognition Arousal/Alertness: Awake/alert Behavior During Therapy: WFL for tasks assessed/performed Overall Cognitive Status: Within Functional Limits for tasks assessed    Balance     End of Session PT - End of Session Equipment Utilized During Treatment: Gait belt Activity Tolerance: Other (comment) (dizziness with decreased BP on standing) Patient left: in bed;with call bell/phone within reach Nurse Communication: Mobility status;Other (comment)   GP     Lisa [name] 02/07/2013, 4:40 PM

## 2013-02-07 NOTE — Evaluation (Signed)
Physical Therapy Evaluation Patient Details Name: Lisa [name] MRN: [id number] DOB: 07-01-1971 Today's Date: 02/07/2013 Time: 8295-6213 PT Time Calculation (min): 32 min  PT Assessment / Plan / Recommendation History of Present Illness  Left total hip arthroplasty, direct anterior approach  Clinical Impression  Pt s/p L THR presents with decreased L LE strength/ROM and post op pain limiting functional mobility.  Pt should progress well to d/c home with HHPT follow up and family assist    PT Assessment  Patient needs continued PT services    Follow Up Recommendations  Home health PT    Does the patient have the potential to tolerate intense rehabilitation      Barriers to Discharge        Equipment Recommendations  Rolling walker with 5" wheels    Recommendations for Other Services OT consult   Frequency 7X/week    Precautions / Restrictions Precautions Precautions: Fall Restrictions Weight Bearing Restrictions: No Other Position/Activity Restrictions: WBAT   Pertinent Vitals/Pain 4/10, premed, ice packs provided      Mobility  Bed Mobility Bed Mobility: Supine to Sit Supine to Sit: 4: Min assist Details for Bed Mobility Assistance: cues for sequence and use of R LE to self assist Transfers Transfers: Sit to Stand;Stand to Sit Sit to Stand: From bed;4: Min assist Stand to Sit: To chair/3-in-1;4: Min assist Details for Transfer Assistance: cues for LE management and use of UEs to self assist Ambulation/Gait Ambulation/Gait Assistance: 4: Min assist Ambulation Distance (Feet): 34 Feet Assistive device: Rolling walker Ambulation/Gait Assistance Details: cues for posture, sequence and position from RW Gait Pattern: Step-to pattern;Antalgic General Gait Details: LTd by dizziness - BP 92/60 - RN aware Stairs: No    Exercises Total Joint Exercises Ankle Circles/Pumps: AROM;Both;15 reps;Supine Quad Sets: AROM;10 reps;Both;Supine Heel Slides: AAROM;15  reps;Supine;Left Hip ABduction/ADduction: AAROM;15 reps;Supine;Left   PT Diagnosis: Difficulty walking  PT Problem List: Decreased strength;Decreased range of motion;Decreased activity tolerance;Decreased mobility;Decreased knowledge of use of DME;Pain PT Treatment Interventions: DME instruction;Gait training;Stair training;Functional mobility training;Therapeutic activities;Therapeutic exercise;Patient/family education     PT Goals(Current goals can be found in the care plan section) Acute Rehab PT Goals Patient Stated Goal: Back to work in less than 6 weeks PT Goal Formulation: With patient Time For Goal Achievement: 02/11/13 Potential to Achieve Goals: Good  Visit Information  Last PT Received On: 02/07/13 Assistance Needed: +1 PT/OT Co-Evaluation/Treatment: Yes History of Present Illness: Left total hip arthroplasty, direct anterior approach       Prior Functioning  Home Living Family/patient expects to be discharged to:: Private residence Living Arrangements: Alone Available Help at Discharge: Family;Friend(s);Available 24 hours/day Type of Home: Apartment Home Access: Stairs to enter Entrance Stairs-Number of Steps: 1+1 Entrance Stairs-Rails: None Home Layout: One level Home Equipment: Cane - single point Additional Comments: Pt states friend will stay 24/7 for several days Prior Function Level of Independence: Independent;Independent with assistive device(s) Communication Communication: No difficulties    Cognition  Cognition Arousal/Alertness: Awake/alert Behavior During Therapy: WFL for tasks assessed/performed Overall Cognitive Status: Within Functional Limits for tasks assessed    Extremity/Trunk Assessment Upper Extremity Assessment Upper Extremity Assessment: Overall WFL for tasks assessed Lower Extremity Assessment Lower Extremity Assessment: LLE deficits/detail LLE Deficits / Details: Hip strength 3-/5 with AAROM at hip to 90 flex and 25 abd   Balance     End of Session PT - End of Session Equipment Utilized During Treatment: Gait belt Activity Tolerance: Patient limited by fatigue Patient left: in chair;with call bell/phone  within reach Nurse Communication: Mobility status;Other (comment) (Decreased BP - 92/60)  GP     Toben Acuna 02/07/2013, 2:04 PM

## 2013-02-08 LAB — PREPARE RBC (CROSSMATCH)

## 2013-02-08 LAB — CBC
HCT: 18.7 % — ABNORMAL LOW (ref 36.0–46.0)
Hemoglobin: 6.2 g/dL — CL (ref 12.0–15.0)
RBC: 2 MIL/uL — ABNORMAL LOW (ref 3.87–5.11)
WBC: 5.5 10*3/uL (ref 4.0–10.5)

## 2013-02-08 NOTE — Progress Notes (Signed)
Subjective: 2 Days Post-Op Procedure(s) (LRB): LEFT TOTAL HIP ARTHROPLASTY ANTERIOR APPROACH (Left) Patient reports pain as mild.  Symptomatic acute blood loss anemia.  Objective: Vital signs in last 24 hours: Temp:  [98.7 F (37.1 C)-99 F (37.2 C)] 98.7 F (37.1 C) (06/29 0420) Pulse Rate:  [64-128] 125 (06/29 0420) Resp:  [12-16] 16 (06/29 0420) BP: (85-98)/(51-64) 85/51 mmHg (06/29 0420) SpO2:  [97 %-100 %] 97 % (06/29 0420)  Intake/Output from previous day: 06/28 0701 - 06/29 0700 In: 1868.3 [P.O.:840; I.V.:1028.3] Out: 2575 [Urine:2575] Intake/Output this shift: Total I/O In: 240 [P.O.:240] Out: 750 [Urine:750]   Recent Labs  02/07/13 0505 02/08/13 0457  HGB 7.3* 6.2*    Recent Labs  02/07/13 0505 02/08/13 0457  WBC 6.3 5.5  RBC 2.39* 2.00*  HCT 22.1* 18.7*  PLT 166 146*    Recent Labs  02/07/13 0505  NA 133*  K 3.7  CL 98  CO2 23  BUN 6  CREATININE 0.41*  GLUCOSE 170*  CALCIUM 8.0*   No results found for this basename: LABPT, INR,  in the last 72 hours  Sensation intact distally Intact pulses distally Incision: no drainage  Assessment/Plan: 2 Days Post-Op Procedure(s) (LRB): LEFT TOTAL HIP ARTHROPLASTY ANTERIOR APPROACH (Left) Up with therapy Transfuse 2 units PRBCs Anticipate d/c to home tomorrow.  BLACKMAN,CHRISTOPHER Y 02/08/2013, 10:31 AM

## 2013-02-08 NOTE — Progress Notes (Signed)
Physical Therapy Treatment Patient Details Name: Lisa [name] MRN: [id number] DOB: 1970-12-17 Today's Date: 02/08/2013 Time: 4098-1191 PT Time Calculation (min): 22 min  PT Assessment / Plan / Recommendation  PT Comments     Follow Up Recommendations  Home health PT     Does the patient have the potential to tolerate intense rehabilitation     Barriers to Discharge        Equipment Recommendations  Rolling walker with 5" wheels    Recommendations for Other Services OT consult  Frequency 7X/week   Progress towards PT Goals Progress towards PT goals: Progressing toward goals  Plan Current plan remains appropriate    Precautions / Restrictions Precautions Precautions: Fall Restrictions Weight Bearing Restrictions: No Other Position/Activity Restrictions: WBAT   Pertinent Vitals/Pain Min c/o pain. Premed, ice pack provided    Mobility  Bed Mobility Bed Mobility: Supine to Sit Supine to Sit: 4: Min assist Details for Bed Mobility Assistance: cues for sequence and use of R LE to self assist Transfers Transfers: Sit to Stand;Stand to Sit Sit to Stand: From bed;4: Min assist Stand to Sit: 4: Min assist;To chair/3-in-1 Details for Transfer Assistance: cues for LE management and use of UEs to self assist Ambulation/Gait Ambulation/Gait Assistance: 4: Min assist Ambulation Distance (Feet): 72 Feet Assistive device: Rolling walker Ambulation/Gait Assistance Details: cues for posture and position from RW Gait Pattern: Step-to pattern;Antalgic;Step-through pattern    Exercises     PT Diagnosis:    PT Problem List:   PT Treatment Interventions:     PT Goals (current goals can now be found in the care plan section) Acute Rehab PT Goals Patient Stated Goal: Back to work in less than 6 weeks  Visit Information  Last PT Received On: 02/08/13 Assistance Needed: +1 History of Present Illness: Left total hip arthroplasty, direct anterior approach    Subjective Data  Subjective: I'm feeling a little dizzy again - after walking 39' Patient Stated Goal: Back to work in less than 6 weeks   Cognition  Cognition Arousal/Alertness: Awake/alert Behavior During Therapy: WFL for tasks assessed/performed Overall Cognitive Status: Within Functional Limits for tasks assessed    Balance     End of Session PT - End of Session Equipment Utilized During Treatment: Gait belt Activity Tolerance: Patient tolerated treatment well Patient left: in chair;with call bell/phone within reach;with family/visitor present   GP     Lisa [name] 02/08/2013, 4:59 PM

## 2013-02-08 NOTE — Progress Notes (Signed)
Recalled Lisa [name] who is taking his own call this am. [name], MD called back but is only taking unassigned call this am. Will report Hgb 6.2 when MD recalls.

## 2013-02-08 NOTE — Anesthesia Postprocedure Evaluation (Signed)
Anesthesia Post Note  Patient: Lisa [name]  Procedure(s) Performed: Procedure(s) (LRB): LEFT TOTAL HIP ARTHROPLASTY ANTERIOR APPROACH (Left)  Anesthesia type: General  Patient location: PACU  Post pain: Pain level controlled  Post assessment: Post-op Vital signs reviewed  Last Vitals:  Filed Vitals:   02/08/13 0420  BP: 85/51  Pulse: 125  Temp: 37.1 C  Resp: 16    Post vital signs: Reviewed  Level of consciousness: sedated  Complications: No apparent anesthesia complications

## 2013-02-08 NOTE — Progress Notes (Signed)
Physical Therapy Treatment Patient Details Name: Lisa [name] MRN: [id number] DOB: [birth year] Today's Date: 02/08/2013 Time: 4098-1191 PT Time Calculation (min): 19 min  PT Assessment / Plan / Recommendation  PT Comments   OOB deferred 2* pt Hgb @ 6.2 and transfusion pending  Follow Up Recommendations  Home health PT     Does the patient have the potential to tolerate intense rehabilitation     Barriers to Discharge        Equipment Recommendations  Rolling walker with 5" wheels    Recommendations for Other Services OT consult  Frequency 7X/week   Progress towards PT Goals Progress towards PT goals: Progressing toward goals  Plan Current plan remains appropriate    Precautions / Restrictions Precautions Precautions: Fall Restrictions Weight Bearing Restrictions: No Other Position/Activity Restrictions: WBAT   Pertinent Vitals/Pain Min c/o pain; pt premedicated    Mobility  Bed Mobility Bed Mobility: Not assessed Transfers Transfers: Not assessed Ambulation/Gait Ambulation/Gait Assistance: Not tested (comment)    Exercises Total Joint Exercises Ankle Circles/Pumps: Both;Supine;AROM;20 reps Quad Sets: AROM;Both;Supine;20 reps Gluteal Sets: AROM;Both;20 reps;Supine Heel Slides: AAROM;Supine;Left;20 reps Hip ABduction/ADduction: AAROM;Supine;Left;20 reps   PT Diagnosis:    PT Problem List:   PT Treatment Interventions:     PT Goals (current goals can now be found in the care plan section) Acute Rehab PT Goals Patient Stated Goal: Back to work in less than 6 weeks  Visit Information  Last PT Received On: 02/08/13 Assistance Needed: +1 History of Present Illness: Left total hip arthroplasty, direct anterior approach    Subjective Data  Patient Stated Goal: Back to work in less than 6 weeks   Cognition  Cognition Arousal/Alertness: Awake/alert Behavior During Therapy: WFL for tasks assessed/performed Overall Cognitive Status: Within Functional Limits for  tasks assessed    Balance     End of Session PT - End of Session Equipment Utilized During Treatment: Gait belt Activity Tolerance: Patient tolerated treatment well Patient left: in bed;with call bell/phone within reach   GP     Alton Bouknight 02/08/2013, 10:05 AM

## 2013-02-08 NOTE — Progress Notes (Signed)
Reemphasized to pt that  Foley Cath was removed at 1800 and will therefore she will  be DTV by 2400 tonight. Encouraged 1C H2O/hour until voiding. Pt Verbalized Understanding

## 2013-02-08 NOTE — Progress Notes (Signed)
Foley Catheter inserted in surgery. Foley cath now Dc'd. Pt. Tolerated well. Due to void. Denies complaints of pain.

## 2013-02-09 ENCOUNTER — Encounter (HOSPITAL_COMMUNITY): Payer: Self-pay | Admitting: Orthopaedic Surgery

## 2013-02-09 LAB — TYPE AND SCREEN
ABO/RH(D): O POS
Antibody Screen: NEGATIVE
Unit division: 0

## 2013-02-09 LAB — CBC
HCT: 24.5 % — ABNORMAL LOW (ref 36.0–46.0)
Hemoglobin: 8.3 g/dL — ABNORMAL LOW (ref 12.0–15.0)
MCHC: 33.9 g/dL (ref 30.0–36.0)
MCV: 88.1 fL (ref 78.0–100.0)
RDW: 15.3 % (ref 11.5–15.5)

## 2013-02-09 MED ORDER — OXYCODONE-ACETAMINOPHEN 5-325 MG PO TABS: 1.0000 | ORAL_TABLET | ORAL | Status: DC | PRN

## 2013-02-09 MED ORDER — METHOCARBAMOL 500 MG PO TABS: 500.0000 mg | ORAL_TABLET | Freq: Four times a day (QID) | ORAL | Status: DC | PRN

## 2013-02-09 MED ORDER — ASPIRIN EC 325 MG PO TBEC: 325.0000 mg | DELAYED_RELEASE_TABLET | Freq: Every day | ORAL | Status: DC

## 2013-02-09 MED ORDER — FERROUS SULFATE 325 (65 FE) MG PO TABS: 325.0000 mg | ORAL_TABLET | Freq: Three times a day (TID) | ORAL | Status: DC

## 2013-02-09 NOTE — Progress Notes (Signed)
Subjective: 3 Days Post-Op Procedure(s) (LRB): LEFT TOTAL HIP ARTHROPLASTY ANTERIOR APPROACH (Left) Patient reports pain as mild.    Objective: Vital signs in last 24 hours: Temp:  [98.2 F (36.8 C)-99.5 F (37.5 C)] 98.5 F (36.9 C) (06/30 0610) Pulse Rate:  [82-123] 104 (06/30 0610) Resp:  [12-20] 12 (06/30 0610) BP: (91-115)/(56-78) 112/77 mmHg (06/30 0610) SpO2:  [97 %-100 %] 97 % (06/30 0610)  Intake/Output from previous day: 06/29 0701 - 06/30 0700 In: 2654.3 [P.O.:1140; I.V.:789.3; Blood:725] Out: 2650 [Urine:2650] Intake/Output this shift:     Recent Labs  02/07/13 0505 02/08/13 0457 02/09/13 0436  HGB 7.3* 6.2* 8.3*    Recent Labs  02/08/13 0457 02/09/13 0436  WBC 5.5 5.3  RBC 2.00* 2.78*  HCT 18.7* 24.5*  PLT 146* 152    Recent Labs  02/07/13 0505  NA 133*  K 3.7  CL 98  CO2 23  BUN 6  CREATININE 0.41*  GLUCOSE 170*  CALCIUM 8.0*   No results found for this basename: LABPT, INR,  in the last 72 hours  Sensation intact distally Intact pulses distally Dorsiflexion/Plantar flexion intact Incision: dressing C/D/I No cellulitis present Compartment soft  Assessment/Plan: 3 Days Post-Op Procedure(s) (LRB): LEFT TOTAL HIP ARTHROPLASTY ANTERIOR APPROACH (Left) Discharge home with home health  Lisa [name] Y 02/09/2013, 7:02 AM

## 2013-02-09 NOTE — Progress Notes (Addendum)
Occupational Therapy Discharge Patient Details Name: Lisa [name] MRN: [id number] DOB: 1971-02-06 Today's Date: 02/09/2013 Time:  -     Patient discharged from OT services secondary to pt reports she will have a friend of hers with her 24/7 to A prn. She is aware that she does not have any restrictions of movement with her direct anterior hip except has pain limits her. Her 40 year old daughter will be staying with her farther while she recuperates.  Pt does not feel that she needs a seat for her shower and feels she can problem solve with the help of her friend as far as stepping into and out of the shower. No further OT needs identified, will sign of.Marland KitchenMarland KitchenPt does not need HHOT and she agrees with this. I have let CM know that HHOT is not needed.  Please see latest therapy progress note for current level of functioning and progress toward goals.    Progress and discharge plan discussed with patient and/or caregiver: Patient/Caregiver agrees with plan     Evette Georges [phone] 02/09/2013, 10:52 AM

## 2013-02-09 NOTE — Progress Notes (Signed)
Utilization review completed.  

## 2013-02-09 NOTE — Progress Notes (Signed)
Physical Therapy Treatment Patient Details Name: Lisa [name] MRN: [id number] DOB: [birth year] Today's Date: 02/09/2013 Time: 4098-1191 PT Time Calculation (min): 38 min  PT Assessment / Plan / Recommendation  PT Comments   Reviewed car transfers with pt.  Follow Up Recommendations  Home health PT     Does the patient have the potential to tolerate intense rehabilitation     Barriers to Discharge        Equipment Recommendations  Rolling walker with 5" wheels    Recommendations for Other Services OT consult  Frequency 7X/week   Progress towards PT Goals Progress towards PT goals: Progressing toward goals  Plan Current plan remains appropriate    Precautions / Restrictions Precautions Precautions: Fall Restrictions Weight Bearing Restrictions: No Other Position/Activity Restrictions: WBAT   Pertinent Vitals/Pain 4-5/10; premed, ice pack provided    Mobility  Bed Mobility Bed Mobility: Supine to Sit Supine to Sit: 5: Supervision Details for Bed Mobility Assistance: cues for sequence and use of R LE to self assist Transfers Transfers: Sit to Stand;Stand to Sit Sit to Stand: 5: Supervision Stand to Sit: 5: Supervision Details for Transfer Assistance: cues for LE management and use of UEs to self assist Ambulation/Gait Ambulation/Gait Assistance: 5: Supervision Assistive device: Rolling walker Ambulation/Gait Assistance Details: cues for posture, postion from RW and stride length Gait Pattern: Step-to pattern;Antalgic;Step-through pattern;Left flexed knee in stance General Gait Details: With recip gair, pt appears to be vaulting over L LE and concerned re: leg length descrepancy.  Will follow up with Dr Magnus Ivan Stairs: Yes Stairs Assistance: 4: Min guard Stair Management Technique: No rails;Step to pattern;Forwards;With walker Number of Stairs: 1 (twice)    Exercises Total Joint Exercises Ankle Circles/Pumps: Both;Supine;AROM;20 reps Quad Sets: AROM;Both;Supine;20  reps Gluteal Sets: AROM;Both;20 reps;Supine Heel Slides: AAROM;Supine;Left;20 reps Hip ABduction/ADduction: AAROM;Supine;Left;20 reps Long Arc Quad: AROM;AAROM;15 reps;Seated;Both   PT Diagnosis:    PT Problem List:   PT Treatment Interventions:     PT Goals (current goals can now be found in the care plan section) Acute Rehab PT Goals Patient Stated Goal: Back to work in less than 6 weeks  Visit Information  Last PT Received On: 02/09/13 Assistance Needed: +1    Subjective Data  Subjective: I am feeling so much better Patient Stated Goal: Back to work in less than 6 weeks   Cognition  Cognition Arousal/Alertness: Awake/alert Behavior During Therapy: WFL for tasks assessed/performed Overall Cognitive Status: Within Functional Limits for tasks assessed    Balance     End of Session PT - End of Session Equipment Utilized During Treatment: Gait belt Activity Tolerance: Patient tolerated treatment well Patient left: in chair;with call bell/phone within reach;with family/visitor present Nurse Communication: Mobility status   GP     Daeshaun Specht 02/09/2013, 12:29 PM

## 2013-02-09 NOTE — Discharge Summary (Signed)
Patient ID: Lisa [name] MRN: [id number] DOB/AGE: 02/25/1971 42 y.o.  Admit date: 02/06/2013 Discharge date: 02/09/2013  Admission Diagnoses:  Principal Problem:   Avascular necrosis of bone of left hip   Discharge Diagnoses:  Same  Past Medical History  Diagnosis Date  . Hyperlipidemia   . Hypertension   . Ulcer     x2  . Heart murmur   . Anxiety 20 years ago  . Depression 20 years ago  . GERD (gastroesophageal reflux disease)   . Anemia     hx of    Surgeries: Procedure(s): LEFT TOTAL HIP ARTHROPLASTY ANTERIOR APPROACH on 02/06/2013   Consultants:    Discharged Condition: Improved  Hospital Course: Lisa [name] is an 42 y.o. female who was admitted 02/06/2013 for operative treatment ofAvascular necrosis of bone of left hip. Patient has severe unremitting pain that affects sleep, daily activities, and work/hobbies. After pre-op clearance the patient was taken to the operating room on 02/06/2013 and underwent  Procedure(s): LEFT TOTAL HIP ARTHROPLASTY ANTERIOR APPROACH.    Patient was given perioperative antibiotics: Anti-infectives   Start     Dose/Rate Route Frequency Ordered Stop   02/06/13 2200  ceFAZolin (ANCEF) IVPB 1 g/50 mL premix     1 g 100 mL/hr over 30 Minutes Intravenous Every 6 hours 02/06/13 1903 02/07/13 0403   02/06/13 1230  ceFAZolin (ANCEF) IVPB 2 g/50 mL premix     2 g 100 mL/hr over 30 Minutes Intravenous On call to O.R. 02/06/13 1210 02/06/13 1554       Patient was given sequential compression devices, early ambulation, and chemoprophylaxis to prevent DVT.  Patient benefited maximally from hospital stay and there were no complications.  She did receive a transfusion for symptomatic acute blood loss anemia.  Recent vital signs: Patient Vitals for the past 24 hrs:  BP Temp Temp src Pulse Resp SpO2  02/09/13 0610 112/77 mmHg 98.5 F (36.9 C) Oral 104 12 97 %  02/08/13 2115 103/66 mmHg 99.1 F (37.3 C) Oral 84 20 100 %  02/08/13 1730 115/78  mmHg 99.1 F (37.3 C) Oral 108 20 -  02/08/13 1618 106/73 mmHg 98.8 F (37.1 C) Oral 103 16 -  02/08/13 1518 96/66 mmHg 98.8 F (37.1 C) Oral 114 16 -  02/08/13 1445 104/73 mmHg 98.2 F (36.8 C) Oral 109 16 -  02/08/13 1400 93/60 mmHg 98.7 F (37.1 C) Oral 113 17 100 %  02/08/13 1325 112/69 mmHg 98.3 F (36.8 C) Oral 114 20 -  02/08/13 1227 96/62 mmHg 99 F (37.2 C) Oral 82 16 -  02/08/13 1131 91/56 mmHg 99.5 F (37.5 C) Oral 123 16 -  02/08/13 1055 100/63 mmHg 98.8 F (37.1 C) Oral 119 17 98 %     Recent laboratory studies:  Recent Labs  02/07/13 0505 02/08/13 0457 02/09/13 0436  WBC 6.3 5.5 5.3  HGB 7.3* 6.2* 8.3*  HCT 22.1* 18.7* 24.5*  PLT 166 146* 152  NA 133*  --   --   K 3.7  --   --   CL 98  --   --   CO2 23  --   --   BUN 6  --   --   CREATININE 0.41*  --   --   GLUCOSE 170*  --   --   CALCIUM 8.0*  --   --      Discharge Medications:     Medication List    STOP taking these medications  HYDROcodone-acetaminophen 5-325 MG per tablet  Commonly known as:  NORCO/VICODIN      TAKE these medications       aspirin EC 325 MG tablet  Take 1 tablet (325 mg total) by mouth daily.     diphenoxylate-atropine 2.5-0.025 MG per tablet  Commonly known as:  LOMOTIL  Take 1 tablet by mouth 3 (three) times daily as needed for diarrhea or loose stools.     ferrous sulfate 325 (65 FE) MG tablet  Take 1 tablet (325 mg total) by mouth 3 (three) times daily after meals.     lisinopril-hydrochlorothiazide 20-12.5 MG per tablet  Commonly known as:  PRINZIDE,ZESTORETIC  Take 1 tablet by mouth every morning.     medroxyPROGESTERone 150 MG/ML injection  Commonly known as:  DEPO-PROVERA  Inject 1 mL (150 mg total) into the muscle every 3 (three) months.     methocarbamol 500 MG tablet  Commonly known as:  ROBAXIN  Take 1 tablet (500 mg total) by mouth every 6 (six) hours as needed.     omega-3 acid ethyl esters 1 G capsule  Commonly known as:  LOVAZA  Take  1 g by mouth at bedtime.     oxyCODONE-acetaminophen 5-325 MG per tablet  Commonly known as:  ROXICET  Take 1-2 tablets by mouth every 4 (four) hours as needed for pain.     pantoprazole 40 MG tablet  Commonly known as:  PROTONIX  Take 40 mg by mouth every morning.     simvastatin 10 MG tablet  Commonly known as:  ZOCOR  Take 10 mg by mouth every evening.     TYLENOL ARTHRITIS PAIN 650 MG CR tablet  Generic drug:  acetaminophen  Take 650 mg by mouth every 8 (eight) hours as needed for pain.        Diagnostic Studies: Dg Chest 2 View  02/03/2013   *RADIOLOGY REPORT*  Clinical Data: Preoperative respiratory evaluation prior to left total hip arthroplasty.  Smoker.  Current history of hypertension.  CHEST - 2 VIEW  Comparison: None.  Findings: Cardiomediastinal silhouette unremarkable.  Lungs clear. Bronchovascular markings normal.  Pulmonary vascularity normal.  No pleural effusions.  No pneumothorax.  Visualized bony thorax intact.  IMPRESSION: Normal examination.   Original Report Authenticated By: Hulan Saas, M.D.   Dg Hip Complete Left  02/06/2013   *RADIOLOGY REPORT*  Clinical Data: Postop left total hip arthroplasty.  DG C-ARM 1-60 MIN - NRPT [facility],LEFT HIP - COMPLETE 2+ VIEW  Comparison: 10/11/2012.  Findings: Technique:  C-arm fluoroscopic images were obtained intraoperatively and submitted for postoperative interpretation. Please see the performing provider's procedural report for the fluoroscopy time utilized.  Spot fluoroscopic images of the left hip and lower pelvis demonstrate left total hip arthroplasty without demonstrated complication.  Hardware appears well positioned.  There is a linear metallic foreign body overlapping the right ischium, not visualized on the subsequent portable radiographs.  IMPRESSION:  1.  No demonstrated complication following left total hip arthroplasty. 2.  Linear foreign body overlying the ischium, not visualized on subsequent views.   Original  Report Authenticated By: Carey Bullocks, M.D.   Dg Pelvis Portable  02/06/2013   *RADIOLOGY REPORT*  Clinical Data: Postop left total hip arthroplasty.  PORTABLE PELVIS  Comparison: 10/11/2012.  Findings: Portable examination of the lower pelvis demonstrates no acute fracture or dislocation status post left total hip arthroplasty.  The upper pelvis is incompletely imaged.  There is some gas within the soft tissues superolateral to the left  hip.  IMPRESSION: No demonstrated complication following left total hip arthroplasty.   Original Report Authenticated By: Carey Bullocks, M.D.   Dg Hip Portable 1 View Left  02/06/2013   *RADIOLOGY REPORT*  Clinical Data: Postop left total hip arthroplasty.  PORTABLE LEFT HIP - 1 VIEW  Comparison: 10/11/2012.  Findings: Cross-table lateral view of the left hip demonstrates no acute fracture or dislocation status post left total hip arthroplasty.  IMPRESSION: No demonstrated complication following left total hip arthroplasty.   Original Report Authenticated By: Carey Bullocks, M.D.   Dg C-arm 1-60 Min-no Report  02/06/2013   *RADIOLOGY REPORT*  Clinical Data: Postop left total hip arthroplasty.  DG C-ARM 1-60 MIN - NRPT [facility],LEFT HIP - COMPLETE 2+ VIEW  Comparison: 10/11/2012.  Findings: Technique:  C-arm fluoroscopic images were obtained intraoperatively and submitted for postoperative interpretation. Please see the performing provider's procedural report for the fluoroscopy time utilized.  Spot fluoroscopic images of the left hip and lower pelvis demonstrate left total hip arthroplasty without demonstrated complication.  Hardware appears well positioned.  There is a linear metallic foreign body overlapping the right ischium, not visualized on the subsequent portable radiographs.  IMPRESSION:  1.  No demonstrated complication following left total hip arthroplasty. 2.  Linear foreign body overlying the ischium, not visualized on subsequent views.   Original Report  Authenticated By: Carey Bullocks, M.D.    Disposition:  to home      Discharge Orders   Future Appointments Provider Department Dept Phone   03/02/2013 10:30 AM [facility] [phone]   03/02/2013 10:45 AM Dava Najjar [facility] [phone]   03/02/2013 11:00 AM Basil Dess, MD [facility] [phone]   Future Orders Complete By Expires     Call MD / Call 911  As directed     Comments:      If you experience chest pain or shortness of breath, CALL 911 and be transported to the hospital emergency room.  If you develope a fever above 101 F, pus (white drainage) or increased drainage or redness at the wound, or calf pain, call your surgeon's office.    Constipation Prevention  As directed     Comments:      Drink plenty of fluids.  Prune juice may be helpful.  You may use a stool softener, such as Colace (over the counter) 100 mg twice a day.  Use MiraLax (over the counter) for constipation as needed.    Diet - low sodium heart healthy  As directed     Discharge instructions  As directed     Comments:      Increase activities as comfort allows. Hold you blood pressure medication this week. You can get your actual dressing wet in the shower. You can remove your dressing 02/13/13 and get your actual incision wet.    Discharge patient  As directed     Increase activity slowly as tolerated  As directed        Follow-up Information   Follow up with Kathryne Hitch, MD In 2 weeks.   Contact information:   [address] [place] [address] [phone]        Signed: Kathryne Hitch 02/09/2013, 7:05 AM

## 2013-02-24 ENCOUNTER — Telehealth: Payer: Self-pay | Admitting: Hematology and Oncology

## 2013-02-24 NOTE — Telephone Encounter (Signed)
C/D 02/24/13 for appt. 03/02/13

## 2013-03-02 ENCOUNTER — Encounter: Payer: Self-pay | Admitting: Hematology and Oncology

## 2013-03-02 ENCOUNTER — Ambulatory Visit (HOSPITAL_BASED_OUTPATIENT_CLINIC_OR_DEPARTMENT_OTHER): Payer: BC Managed Care – PPO | Admitting: Hematology and Oncology

## 2013-03-02 ENCOUNTER — Other Ambulatory Visit (HOSPITAL_BASED_OUTPATIENT_CLINIC_OR_DEPARTMENT_OTHER): Payer: BC Managed Care – PPO | Admitting: Lab

## 2013-03-02 ENCOUNTER — Telehealth: Payer: Self-pay | Admitting: Medical Oncology

## 2013-03-02 ENCOUNTER — Ambulatory Visit (HOSPITAL_BASED_OUTPATIENT_CLINIC_OR_DEPARTMENT_OTHER): Payer: BC Managed Care – PPO

## 2013-03-02 VITALS — BP 126/92 | HR 117 | Temp 97.7°F | Resp 18 | Ht 68.0 in | Wt 143.5 lb

## 2013-03-02 DIAGNOSIS — D649 Anemia, unspecified: Secondary | ICD-10-CM

## 2013-03-02 DIAGNOSIS — D72819 Decreased white blood cell count, unspecified: Secondary | ICD-10-CM

## 2013-03-02 LAB — CBC WITH DIFFERENTIAL/PLATELET
Basophils Absolute: 0 10*3/uL (ref 0.0–0.1)
EOS%: 1.5 % (ref 0.0–7.0)
Eosinophils Absolute: 0.1 10*3/uL (ref 0.0–0.5)
HGB: 13.7 g/dL (ref 11.6–15.9)
LYMPH%: 39.3 % (ref 14.0–49.7)
MCH: 30.7 pg (ref 25.1–34.0)
MCV: 93.8 fL (ref 79.5–101.0)
MONO%: 9 % (ref 0.0–14.0)
NEUT#: 2.2 10*3/uL (ref 1.5–6.5)
Platelets: 251 10*3/uL (ref 145–400)
RBC: 4.46 10*6/uL (ref 3.70–5.45)

## 2013-03-02 LAB — COMPREHENSIVE METABOLIC PANEL (CC13)
AST: 43 U/L — ABNORMAL HIGH (ref 5–34)
Alkaline Phosphatase: 89 U/L (ref 40–150)
BUN: 10.6 mg/dL (ref 7.0–26.0)
Glucose: 107 mg/dl (ref 70–140)
Total Bilirubin: 0.87 mg/dL (ref 0.20–1.20)

## 2013-03-02 LAB — TSH CHCC: TSH: 1.008 m(IU)/L (ref 0.308–3.960)

## 2013-03-02 NOTE — Progress Notes (Signed)
[facility]  Telephone:([phone] Fax:([phone]) [phone]   MEDICAL ONCOLOGY - INITIAL CONSULATION    Referral MD   Reason for Referral:  leukopenia.  HPI:   We had the pleasure of seeing [name] in our hematology clinic today for consultation regarding her leukopenia. As you know  Lisa [name] is a pleasant 42 years old female with multiple medical problems including history of HLP,  hypertension; who was found to have decreased white blood cell count was 3.4, and neutrophils of 1.3 on a CBC was done in March and April of 2014.  In the  clinic today [name] denied any complaints, except for hip pain (she just underwent THR in June of 2014). Patient denied any fever chills, denied nausea vomiting, denied abdominal pain constipation or diarrhea. He has history of recent infection, no recent travel history and no recent use of antibiotics. Her energy level is stable and she is active. Denied any weight loss. She became anemic after her hip surgery due to blood loss (HB 6.2) and received blood transfusion.        Past Medical History  Diagnosis Date  . Hyperlipidemia   . Hypertension   . Ulcer     x2  . Heart murmur   . Anxiety 20 years ago  . Depression 20 years ago  . GERD (gastroesophageal reflux disease)   . Anemia     hx of  :  Past Surgical History  Procedure Laterality Date  . No past surgeries    . Esophagogastroduodenoscopy endoscopy      at least 4  . Total hip arthroplasty Left 02/06/2013    Procedure: LEFT TOTAL HIP ARTHROPLASTY ANTERIOR APPROACH;  Surgeon: Kathryne Hitch, MD;  Location: [facility];  Service: Orthopedics;  Laterality: Left;  :  Current Outpatient Prescriptions  Medication Sig Dispense Refill  . HYDROcodone-acetaminophen (NORCO/VICODIN) 5-325 MG per tablet Take 1-2 tablets by mouth every 12 (twelve) hours as needed for pain.      Marland Kitchen lisinopril-hydrochlorothiazide (PRINZIDE,ZESTORETIC) 20-12.5 MG per tablet  Take 1 tablet by mouth every morning.      . medroxyPROGESTERone (DEPO-PROVERA) 150 MG/ML injection Inject 1 mL (150 mg total) into the muscle every 3 (three) months.  1 mL  0  . omega-3 acid ethyl esters (LOVAZA) 1 G capsule Take 1 g by mouth at bedtime.      Marland Kitchen OVER THE COUNTER MEDICATION [employer] Brand Sleep aid as needed      . pantoprazole (PROTONIX) 40 MG tablet Take 40 mg by mouth every morning.      . simvastatin (ZOCOR) 10 MG tablet Take 10 mg by mouth every evening.      . diphenoxylate-atropine (LOMOTIL) 2.5-0.025 MG per tablet Take 1 tablet by mouth 3 (three) times daily as needed for diarrhea or loose stools.       No current facility-administered medications for this visit.     Allergies  Allergen Reactions  . Nsaids Other (See Comments)    ulcers  :  Family History  Problem Relation Age of Onset  . Adopted: Yes  :  History   Social History  . Marital Status: Single    Spouse Name: N/A    Number of Children: N/A  . Years of Education: N/A   Occupational History  . Not on file.   Social History Main Topics  . Smoking status: Current Every Day Smoker -- 0.25 packs/day for 20 years  Types: Cigarettes  . Smokeless tobacco: Never Used  . Alcohol Use: 0.0 oz/week     Comment: few glasses of wine a week  . Drug Use: No  . Sexually Active: No   Other Topics Concern  . Not on file   Social History Narrative  . No narrative on file  :  Pertinent items are noted in HPI.  Exam: Blood pressure 126/92, pulse 117, temperature 97.7 F (36.5 C), temperature source Oral, resp. rate 18, height 5\' 8"  (1.727 m), weight 143 lb 8 oz (65.091 kg).   ECOG PERFORMANCE STATUS: 0 - Asymptomatic  GENERAL: No distress, well nourished.  SKIN:  No rashes or significant lesions  HEAD: Normocephalic, No masses, lesions, tenderness or abnormalities  EYES: Conjunctiva are pink and non-injected  ENT: External ears normal ,lips , buccal mucosa, and tongue normal and mucous membranes  are moist  LYMPH: No palpable lymphadenopathy  BREAST:Normal without mass, skin or nipple changes or axillary nodes,  LUNGS: Clear to auscultation, no crackles or wheezes HEART: Regular rate & rhythm, no murmurs, no gallops, S1 normal and S2 normal  ABDOMEN: Abdomen soft, non-tender, normal bowel sounds, no masses or organomegaly and no hepatosplenomegaly  MSK: No CVA tenderness and no tenderness on percussion of the back or rib cage. EXTREMITIES: No edema, no skin discoloration or tenderness NEURO: Alert & oriented, no focal motor/sensory deficits.  Lab Results  Component Value Date   WBC 4.5 03/02/2013   HGB 13.7 03/02/2013   HCT 41.9 03/02/2013   PLT 251 03/02/2013   GLUCOSE 107 03/02/2013   CHOL 251* 01/04/2013   TRIG 511* 01/04/2013   HDL 81 01/04/2013   LDLCALC Comment:   Not calculated due to Triglyceride >400. Suggest ordering Direct LDL (Unit Code: 40981).   Total Cholesterol/HDL Ratio:CHD Risk                        Coronary Heart Disease Risk Table                                        Men       Women          1/2 Average Risk              3.4        3.3              Average Risk              5.0        4.4           2X Average Risk              9.6        7.1           3X Average Risk             23.4       11.0 Use the calculated Patient Ratio above and the CHD Risk table  to determine the patient's CHD Risk. ATP III Classification (LDL):       < 100        mg/dL         Optimal      191 - 129     mg/dL         Near or Above Optimal  130 - 159     mg/dL         Borderline High      160 - 189     mg/dL         High       > 161        mg/dL         Very High   0/96/0454   ALT 40 03/02/2013   AST 43* 03/02/2013   NA 139 03/02/2013   K 3.7 03/02/2013   CL 98 02/07/2013   CREATININE 0.7 03/02/2013   BUN 10.6 03/02/2013   CO2 23 03/02/2013    Dg Chest 2 View  02/03/2013   *RADIOLOGY REPORT*  Clinical Data: Preoperative respiratory evaluation prior to left total hip arthroplasty.  Smoker.   Current history of hypertension.  CHEST - 2 VIEW  Comparison: None.  Findings: Cardiomediastinal silhouette unremarkable.  Lungs clear. Bronchovascular markings normal.  Pulmonary vascularity normal.  No pleural effusions.  No pneumothorax.  Visualized bony thorax intact.  IMPRESSION: Normal examination.   Original Report Authenticated By: Hulan Saas, M.D.   Dg Hip Complete Left  02/06/2013   *RADIOLOGY REPORT*  Clinical Data: Postop left total hip arthroplasty.  DG C-ARM 1-60 MIN - NRPT [facility],LEFT HIP - COMPLETE 2+ VIEW  Comparison: 10/11/2012.  Findings: Technique:  C-arm fluoroscopic images were obtained intraoperatively and submitted for postoperative interpretation. Please see the performing provider's procedural report for the fluoroscopy time utilized.  Spot fluoroscopic images of the left hip and lower pelvis demonstrate left total hip arthroplasty without demonstrated complication.  Hardware appears well positioned.  There is a linear metallic foreign body overlapping the right ischium, not visualized on the subsequent portable radiographs.  IMPRESSION:  1.  No demonstrated complication following left total hip arthroplasty. 2.  Linear foreign body overlying the ischium, not visualized on subsequent views.   Original Report Authenticated By: Carey Bullocks, M.D.   Dg Pelvis Portable  02/06/2013   *RADIOLOGY REPORT*  Clinical Data: Postop left total hip arthroplasty.  PORTABLE PELVIS  Comparison: 10/11/2012.  Findings: Portable examination of the lower pelvis demonstrates no acute fracture or dislocation status post left total hip arthroplasty.  The upper pelvis is incompletely imaged.  There is some gas within the soft tissues superolateral to the left hip.  IMPRESSION: No demonstrated complication following left total hip arthroplasty.   Original Report Authenticated By: Carey Bullocks, M.D.   Dg Hip Portable 1 View Left  02/06/2013   *RADIOLOGY REPORT*  Clinical Data: Postop left total hip  arthroplasty.  PORTABLE LEFT HIP - 1 VIEW  Comparison: 10/11/2012.  Findings: Cross-table lateral view of the left hip demonstrates no acute fracture or dislocation status post left total hip arthroplasty.  IMPRESSION: No demonstrated complication following left total hip arthroplasty.   Original Report Authenticated By: Carey Bullocks, M.D.   Dg C-arm 1-60 Min-no Report  02/06/2013   *RADIOLOGY REPORT*  Clinical Data: Postop left total hip arthroplasty.  DG C-ARM 1-60 MIN - NRPT [facility],LEFT HIP - COMPLETE 2+ VIEW  Comparison: 10/11/2012.  Findings: Technique:  C-arm fluoroscopic images were obtained intraoperatively and submitted for postoperative interpretation. Please see the performing provider's procedural report for the fluoroscopy time utilized.  Spot fluoroscopic images of the left hip and lower pelvis demonstrate left total hip arthroplasty without demonstrated complication.  Hardware appears well positioned.  There is a linear metallic foreign body overlapping the right ischium, not visualized on the subsequent portable radiographs.  IMPRESSION:  1.  No demonstrated complication following left total hip arthroplasty. 2.  Linear foreign body overlying the ischium, not visualized on subsequent views.   Original Report Authenticated By: Carey Bullocks, M.D.    Dg Chest 2 View  02/03/2013   *RADIOLOGY REPORT*  Clinical Data: Preoperative respiratory evaluation prior to left total hip arthroplasty.  Smoker.  Current history of hypertension.  CHEST - 2 VIEW  Comparison: None.  Findings: Cardiomediastinal silhouette unremarkable.  Lungs clear. Bronchovascular markings normal.  Pulmonary vascularity normal.  No pleural effusions.  No pneumothorax.  Visualized bony thorax intact.  IMPRESSION: Normal examination.   Original Report Authenticated By: Hulan Saas, M.D.   Dg Hip Complete Left  02/06/2013   *RADIOLOGY REPORT*  Clinical Data: Postop left total hip arthroplasty.  DG C-ARM 1-60 MIN - NRPT  [facility],LEFT HIP - COMPLETE 2+ VIEW  Comparison: 10/11/2012.  Findings: Technique:  C-arm fluoroscopic images were obtained intraoperatively and submitted for postoperative interpretation. Please see the performing provider's procedural report for the fluoroscopy time utilized.  Spot fluoroscopic images of the left hip and lower pelvis demonstrate left total hip arthroplasty without demonstrated complication.  Hardware appears well positioned.  There is a linear metallic foreign body overlapping the right ischium, not visualized on the subsequent portable radiographs.  IMPRESSION:  1.  No demonstrated complication following left total hip arthroplasty. 2.  Linear foreign body overlying the ischium, not visualized on subsequent views.   Original Report Authenticated By: Carey Bullocks, M.D.   Dg Pelvis Portable  02/06/2013   *RADIOLOGY REPORT*  Clinical Data: Postop left total hip arthroplasty.  PORTABLE PELVIS  Comparison: 10/11/2012.  Findings: Portable examination of the lower pelvis demonstrates no acute fracture or dislocation status post left total hip arthroplasty.  The upper pelvis is incompletely imaged.  There is some gas within the soft tissues superolateral to the left hip.  IMPRESSION: No demonstrated complication following left total hip arthroplasty.   Original Report Authenticated By: Carey Bullocks, M.D.   Dg Hip Portable 1 View Left  02/06/2013   *RADIOLOGY REPORT*  Clinical Data: Postop left total hip arthroplasty.  PORTABLE LEFT HIP - 1 VIEW  Comparison: 10/11/2012.  Findings: Cross-table lateral view of the left hip demonstrates no acute fracture or dislocation status post left total hip arthroplasty.  IMPRESSION: No demonstrated complication following left total hip arthroplasty.   Original Report Authenticated By: Carey Bullocks, M.D.   Dg C-arm 1-60 Min-no Report  02/06/2013   *RADIOLOGY REPORT*  Clinical Data: Postop left total hip arthroplasty.  DG C-ARM 1-60 MIN - NRPT [facility],LEFT HIP -  COMPLETE 2+ VIEW  Comparison: 10/11/2012.  Findings: Technique:  C-arm fluoroscopic images were obtained intraoperatively and submitted for postoperative interpretation. Please see the performing provider's procedural report for the fluoroscopy time utilized.  Spot fluoroscopic images of the left hip and lower pelvis demonstrate left total hip arthroplasty without demonstrated complication.  Hardware appears well positioned.  There is a linear metallic foreign body overlapping the right ischium, not visualized on the subsequent portable radiographs.  IMPRESSION:  1.  No demonstrated complication following left total hip arthroplasty. 2.  Linear foreign body overlying the ischium, not visualized on subsequent views.   Original Report Authenticated By: Carey Bullocks, M.D.    Assessment and Plan:   [name]  is a pleasant 43 years old female who was found to have leukopenia back in March, 2014. Today with [name]  we reviewed the CBC results, we explained the finding to her.  We also compared that CBC to the most recent one in June which was normal except for the anemia. We explained to the patient is a such finding could be due to different number of reason such as infection, inflammation, drugs.  We informed [name]  that the first step is to repeat her blood count today and if the count is normal, we would recommend active surveillance and no further intervention. Will call patient with the result and the final plan.    Zachery Dakins, MD 03/02/2013 12:52 PM

## 2013-03-02 NOTE — Progress Notes (Signed)
she doesn't have a PCP, but goes to Urgent care and sees the PA  [employer]

## 2013-03-02 NOTE — Progress Notes (Signed)
Checked in new patient. No financial issues. She does have a living will/POA. email address on file is correct.

## 2013-03-02 NOTE — Telephone Encounter (Signed)
I called pt and left her a message regarding her labs results. I explained that her Hgb and HCT were back to normal. Dr. Karel Jarvis would like for pt to follow up with her primary care and if needed we will be happy to see pt again. I asked her to call me if any questions or concerns.

## 2013-03-04 LAB — FOLATE: Folate: 12.9 ng/mL

## 2013-03-04 LAB — T4, FREE: Free T4: 1.37 ng/dL (ref 0.80–1.80)

## 2013-03-18 ENCOUNTER — Other Ambulatory Visit: Payer: Self-pay | Admitting: Physician Assistant

## 2013-03-28 ENCOUNTER — Ambulatory Visit (INDEPENDENT_AMBULATORY_CARE_PROVIDER_SITE_OTHER): Payer: BC Managed Care – PPO | Admitting: Radiology

## 2013-03-28 DIAGNOSIS — IMO0001 Reserved for inherently not codable concepts without codable children: Secondary | ICD-10-CM

## 2013-03-28 DIAGNOSIS — Z309 Encounter for contraceptive management, unspecified: Secondary | ICD-10-CM

## 2013-03-28 MED ORDER — MEDROXYPROGESTERONE ACETATE 150 MG/ML IM SUSP
150.0000 mg | Freq: Once | INTRAMUSCULAR | Status: AC
  Administered 2013-03-28: 150 mg via INTRAMUSCULAR

## 2013-04-21 LAB — HM MAMMOGRAPHY

## 2013-06-03 ENCOUNTER — Other Ambulatory Visit: Payer: Self-pay | Admitting: Family Medicine

## 2013-06-23 ENCOUNTER — Ambulatory Visit (INDEPENDENT_AMBULATORY_CARE_PROVIDER_SITE_OTHER): Payer: BC Managed Care – PPO

## 2013-06-23 DIAGNOSIS — Z309 Encounter for contraceptive management, unspecified: Secondary | ICD-10-CM

## 2013-06-23 MED ORDER — MEDROXYPROGESTERONE ACETATE 150 MG/ML IM SUSP: 150.0000 mg | INTRAMUSCULAR | Status: DC

## 2013-06-23 MED ORDER — MEDROXYPROGESTERONE ACETATE 150 MG/ML IM SUSP
150.0000 mg | Freq: Once | INTRAMUSCULAR | Status: AC
  Administered 2013-06-23: 150 mg via INTRAMUSCULAR

## 2013-07-11 ENCOUNTER — Other Ambulatory Visit: Payer: Self-pay | Admitting: Family Medicine

## 2013-07-11 DIAGNOSIS — R197 Diarrhea, unspecified: Secondary | ICD-10-CM

## 2013-09-21 ENCOUNTER — Encounter: Payer: Self-pay | Admitting: Family Medicine

## 2013-09-21 ENCOUNTER — Ambulatory Visit (INDEPENDENT_AMBULATORY_CARE_PROVIDER_SITE_OTHER): Payer: BC Managed Care – PPO | Admitting: Family Medicine

## 2013-09-21 VITALS — BP 110/60 | HR 90 | Resp 18 | Ht 67.5 in | Wt 160.0 lb

## 2013-09-21 DIAGNOSIS — E7800 Pure hypercholesterolemia, unspecified: Secondary | ICD-10-CM

## 2013-09-21 DIAGNOSIS — I10 Essential (primary) hypertension: Secondary | ICD-10-CM

## 2013-09-21 DIAGNOSIS — IMO0001 Reserved for inherently not codable concepts without codable children: Secondary | ICD-10-CM

## 2013-09-21 DIAGNOSIS — Z0000 Encounter for general adult medical examination without abnormal findings: Secondary | ICD-10-CM

## 2013-09-21 DIAGNOSIS — G4700 Insomnia, unspecified: Secondary | ICD-10-CM

## 2013-09-21 DIAGNOSIS — Z309 Encounter for contraceptive management, unspecified: Secondary | ICD-10-CM

## 2013-09-21 DIAGNOSIS — Z124 Encounter for screening for malignant neoplasm of cervix: Secondary | ICD-10-CM

## 2013-09-21 DIAGNOSIS — R635 Abnormal weight gain: Secondary | ICD-10-CM

## 2013-09-21 DIAGNOSIS — Z8742 Personal history of other diseases of the female genital tract: Secondary | ICD-10-CM

## 2013-09-21 DIAGNOSIS — R197 Diarrhea, unspecified: Secondary | ICD-10-CM

## 2013-09-21 LAB — COMPREHENSIVE METABOLIC PANEL
ALT: 18 U/L (ref 0–35)
AST: 18 U/L (ref 0–37)
Albumin: 4.6 g/dL (ref 3.5–5.2)
Alkaline Phosphatase: 47 U/L (ref 39–117)
BUN: 11 mg/dL (ref 6–23)
CALCIUM: 9.7 mg/dL (ref 8.4–10.5)
CO2: 23 meq/L (ref 19–32)
Chloride: 102 mEq/L (ref 96–112)
Creat: 0.58 mg/dL (ref 0.50–1.10)
Glucose, Bld: 88 mg/dL (ref 70–99)
Potassium: 4.3 mEq/L (ref 3.5–5.3)
SODIUM: 137 meq/L (ref 135–145)
TOTAL PROTEIN: 7.4 g/dL (ref 6.0–8.3)
Total Bilirubin: 0.4 mg/dL (ref 0.2–1.2)

## 2013-09-21 LAB — CBC
HCT: 39.2 % (ref 36.0–46.0)
Hemoglobin: 13.2 g/dL (ref 12.0–15.0)
MCH: 28.2 pg (ref 26.0–34.0)
MCHC: 33.7 g/dL (ref 30.0–36.0)
MCV: 83.8 fL (ref 78.0–100.0)
Platelets: 260 10*3/uL (ref 150–400)
RBC: 4.68 MIL/uL (ref 3.87–5.11)
RDW: 13.6 % (ref 11.5–15.5)
WBC: 5.3 10*3/uL (ref 4.0–10.5)

## 2013-09-21 LAB — LIPID PANEL
CHOL/HDL RATIO: 5 ratio
Cholesterol: 278 mg/dL — ABNORMAL HIGH (ref 0–200)
HDL: 56 mg/dL (ref >39)
LDL Cholesterol: 159 mg/dL — ABNORMAL HIGH (ref 0–99)
Triglycerides: 314 mg/dL — ABNORMAL HIGH (ref <150)
VLDL: 63 mg/dL — ABNORMAL HIGH (ref 0–40)

## 2013-09-21 LAB — TSH: TSH: 1.96 u[IU]/mL (ref 0.350–4.500)

## 2013-09-21 MED ORDER — TRAZODONE HCL 50 MG PO TABS: 25.0000 mg | ORAL_TABLET | Freq: Every evening | ORAL | Status: DC | PRN

## 2013-09-21 MED ORDER — DIPHENOXYLATE-ATROPINE 2.5-0.025 MG PO TABS: ORAL_TABLET | ORAL | Status: DC

## 2013-09-21 MED ORDER — MEDROXYPROGESTERONE ACETATE 150 MG/ML IM SUSP
150.0000 mg | Freq: Once | INTRAMUSCULAR | Status: AC
  Administered 2013-09-21: 150 mg via INTRAMUSCULAR

## 2013-09-21 NOTE — Progress Notes (Addendum)
[facility] [address] [place] [address] [phone]  Date:  09/21/2013   Name:  Lisa [name]   DOB:  Dec 27, 1970   MRN:  [id number]  PCP:  No PCP Per Patient    Chief Complaint: Annual Exam, other and med refills   History of Present Illness:  Lisa [name] is a 43 y.o. very pleasant female patient who presents with the following:  History of leukopenia (recent evaluation by heme- onc in July of 2014), AVN left hip s/p total hip 01/2013, HTN, abnormal pap.  Here today for a CPE  Dr. Rayburn Ma at [facility] did her hip. She is doing great- "there is no pain." She did need a blood transfusion after her surgery due to dizziness and anemia.    She recently saw GI and her LFTs looked good per her report. Admits that she had been drinking more wine that she should have prior to her hip surgery, which likely contributed to her liver issues.  She is not currently drinking alcohol except "maybe one or two on the weekend."  She was seen at [facility]- Dr. Bosie Clos, she follows up every 6 months.    She has had 3 HA that she thinks might be a migraine since her operation.  She will notice nausea, light and sound sensitivity.  She would not vomit, but might have some dry heaves.  No aura that she noticed.  Her last headache was on Christmas eve.    She is also having some trouble sleeping.  She uses OTC sleep medications as needed.   Several years ago she used trazadone which did work for her.  She is able to sleep with OTC sleep aids but admits she is taking a much higher dose than she should be.  Wonders if she might try trazadone again  She had a mammogram over the summer.  She has seen OBG for follow-up of abnormal pap-  Dr. Chevis Pretty at [facility] [place].  Her last pap was about 18 months ago.  Looking back in her chart she had LSIL in May of 2012, then had colpo and bx per Dr. Chevis Pretty.  I do not have his last note regarding her progress.  She had a pap in September 2013 at [facility]-  called [facility] and was told this was negative, and she had been meant to have a repeat in 6 months but so far has not followed up there.    She is due for her Depo shot today.  Last depo done on 06/23/13- window 1/27- 2/10 so she is in window today.  She is not SA.   She did eat breakfast today   Seasonal flu shot done at her job in the fall  Patient Active Problem List   Diagnosis Date Noted  . Leukocytopenia, unspecified 03/02/2013  . Avascular necrosis of bone of left hip 02/06/2013  . Hyperlipidemia 10/13/2012  . HTN (hypertension) 10/13/2012    Past Medical History  Diagnosis Date  . Hyperlipidemia   . Hypertension   . Ulcer     x2  . Heart murmur   . Anxiety 20 years ago  . Depression 20 years ago  . GERD (gastroesophageal reflux disease)   . Anemia     hx of    Past Surgical History  Procedure Laterality Date  . No past surgeries    . Esophagogastroduodenoscopy endoscopy      at least 4  . Total hip arthroplasty Left 02/06/2013  Procedure: LEFT TOTAL HIP ARTHROPLASTY ANTERIOR APPROACH;  Surgeon: Kathryne Hitchhristopher Y Blackman, MD;  Location: [facility];  Service: Orthopedics;  Laterality: Left;    History  Substance Use Topics  . Smoking status: Former Smoker -- 0.25 packs/day for 20 years    Types: Cigarettes    Quit date: 08/13/2013  . Smokeless tobacco: Never Used  . Alcohol Use: 0.0 oz/week     Comment: few glasses of wine a week    Family History  Problem Relation Age of Onset  . Adopted: Yes  . Family history unknown: Yes    Allergies  Allergen Reactions  . Nsaids Other (See Comments)    ulcers    Medication list has been reviewed and updated.  Current Outpatient Prescriptions on File Prior to Visit  Medication Sig Dispense Refill  . diphenoxylate-atropine (LOMOTIL) 2.5-0.025 MG per tablet TAKE 1 TABLET BY MOUTH 3 TIMES A DAY AS NEEDED FOR DIARRHEA OR LOOSE STOOLS  60 tablet  0  . lisinopril-hydrochlorothiazide (PRINZIDE,ZESTORETIC) 20-12.5 MG per  tablet Take 1 tablet by mouth every morning.      Marland Kitchen. OVER THE COUNTER MEDICATION [employer] Brand Sleep aid as needed      . pantoprazole (PROTONIX) 40 MG tablet Take 40 mg by mouth every morning.      . simvastatin (ZOCOR) 10 MG tablet Take 10 mg by mouth every evening.      Marland Kitchen. HYDROcodone-acetaminophen (NORCO/VICODIN) 5-325 MG per tablet Take 1-2 tablets by mouth every 12 (twelve) hours as needed for pain.      Marland Kitchen. omega-3 acid ethyl esters (LOVAZA) 1 G capsule Take 1 g by mouth at bedtime.       No current facility-administered medications on file prior to visit.    Review of Systems:  As per HPI- otherwise negative.   Physical Examination: Filed Vitals:   09/21/13 0902  BP: 110/60  Pulse: 108  Resp: 18   Filed Vitals:   09/21/13 0902  Height: 5' 7.5" (1.715 m)  Weight: 160 lb (72.576 kg)   Body mass index is 24.68 kg/(m^2). Ideal Body Weight: Weight in (lb) to have BMI = 25: 161.7  GEN: WDWN, NAD, Non-toxic, A & O x 3, looks well HEENT: Atraumatic, Normocephalic. Neck supple. No masses, No LAD.  Bilateral TM wnl, oropharynx normal.  PEERL,EOMI.   Ears and Nose: No external deformity. CV: RRR, No M/G/R. No JVD. No thrill. No extra heart sounds. PULM: CTA B, no wheezes, crackles, rhonchi. No retractions. No resp. distress. No accessory muscle use. ABD: S, NT, ND, +BS. No rebound. No HSM. EXTR: No c/c/e NEURO Normal gait.  PSYCH: Normally interactive. Conversant. Not depressed or anxious appearing.  Calm demeanor.  Breast: normal exam, no masses, discharge or dimpling bilaterally GU: normal exam, no CMT, no lesions or discharge, normal uterus and adnexa   Assessment and Plan: Routine general medical examination at a health care facility - Plan: CBC, Comprehensive metabolic panel  Diarrhea - Plan: diphenoxylate-atropine (LOMOTIL) 2.5-0.025 MG per tablet  High cholesterol - Plan: Lipid panel  HTN (hypertension) - Plan: Comprehensive metabolic panel  Weight gain - Plan:  TSH  Screening for cervical cancer - Plan: Pap IG w/ reflex to HPV when ASC-U, CANCELED: Pap IG w/ reflex to HPV when ASC-U  Contraception - Plan: medroxyPROGESTERone (DEPO-PROVERA) injection 150 mg  Insomnia - Plan: traZODone (DESYREL) 50 MG tablet  History of abnormal cervical Pap smear  Await labs as above Refilled her PRN lomotil that she uses for diarrhea She  has noted some weight gain as of late- check TSH Wt Readings from Last 3 Encounters:  09/21/13 160 lb (72.576 kg)  03/02/13 143 lb 8 oz (65.091 kg)  02/06/13 148 lb (67.132 kg)   Await pap.   Try trazadone as needed for sleep, start at 25 mg.  Do not combine with alcohol Depo today  Signed Abbe Amsterdam, MD  Received fax from [facility].  Her most recent LFTs done 07/31/13 are normal AP 46 ALT 20 AST 19

## 2013-09-21 NOTE — Patient Instructions (Addendum)
Return for your next Depo Provera injection between April 27th and May 11th Please consider signing up for Mychart, this is an excellent way to view your test results, including labs and also to correspond with us if you have questions or concerns.   Start taking traadone- start with a 1/2 tablet or 25 mg at bedtime.  You may go up to a whole tablet as needed  Try taking some zyrtec for your cough and post- nasal drainage.  Let me know if not helpful

## 2013-09-23 ENCOUNTER — Encounter: Payer: Self-pay | Admitting: Family Medicine

## 2013-09-23 LAB — PAP IG W/ RFLX HPV ASCU

## 2013-09-28 ENCOUNTER — Encounter: Payer: Self-pay | Admitting: Family Medicine

## 2013-09-28 DIAGNOSIS — E785 Hyperlipidemia, unspecified: Secondary | ICD-10-CM

## 2013-09-28 MED ORDER — SIMVASTATIN 20 MG PO TABS: 20.0000 mg | ORAL_TABLET | Freq: Every evening | ORAL | Status: DC

## 2013-12-01 ENCOUNTER — Ambulatory Visit (INDEPENDENT_AMBULATORY_CARE_PROVIDER_SITE_OTHER): Payer: BC Managed Care – PPO | Admitting: Physician Assistant

## 2013-12-01 VITALS — BP 100/60 | HR 111 | Temp 98.6°F | Resp 18 | Ht 66.75 in | Wt 162.8 lb

## 2013-12-01 DIAGNOSIS — R61 Generalized hyperhidrosis: Secondary | ICD-10-CM

## 2013-12-01 DIAGNOSIS — D72829 Elevated white blood cell count, unspecified: Secondary | ICD-10-CM

## 2013-12-01 DIAGNOSIS — E785 Hyperlipidemia, unspecified: Secondary | ICD-10-CM

## 2013-12-01 DIAGNOSIS — J069 Acute upper respiratory infection, unspecified: Secondary | ICD-10-CM

## 2013-12-01 LAB — HEPATIC FUNCTION PANEL
ALBUMIN: 5 g/dL (ref 3.5–5.2)
ALT: 15 U/L (ref 0–35)
AST: 11 U/L (ref 0–37)
Alkaline Phosphatase: 52 U/L (ref 39–117)
BILIRUBIN DIRECT: 0.2 mg/dL (ref 0.0–0.3)
Indirect Bilirubin: 0.4 mg/dL (ref 0.2–1.2)
Total Bilirubin: 0.6 mg/dL (ref 0.2–1.2)
Total Protein: 7.9 g/dL (ref 6.0–8.3)

## 2013-12-01 LAB — POCT CBC
Granulocyte percent: 81.4 %G — AB (ref 37–80)
HEMATOCRIT: 40 % (ref 37.7–47.9)
Hemoglobin: 12.5 g/dL (ref 12.2–16.2)
Lymph, poc: 1.8 (ref 0.6–3.4)
MCH, POC: 27.7 pg (ref 27–31.2)
MCHC: 31.3 g/dL — AB (ref 31.8–35.4)
MCV: 88.8 fL (ref 80–97)
MID (cbc): 0.5 (ref 0–0.9)
MPV: 9.5 fL (ref 0–99.8)
POC Granulocyte: 10.3 — AB (ref 2–6.9)
POC LYMPH PERCENT: 14.4 %L (ref 10–50)
POC MID %: 4.2 % (ref 0–12)
Platelet Count, POC: 256 10*3/uL (ref 142–424)
RBC: 4.51 M/uL (ref 4.04–5.48)
RDW, POC: 14.1 %
WBC: 12.6 10*3/uL — AB (ref 4.6–10.2)

## 2013-12-01 LAB — LIPID PANEL
CHOL/HDL RATIO: 2.9 ratio
CHOLESTEROL: 204 mg/dL — AB (ref 0–200)
HDL: 71 mg/dL (ref >39)
LDL Cholesterol: 108 mg/dL — ABNORMAL HIGH (ref 0–99)
TRIGLYCERIDES: 124 mg/dL (ref <150)
VLDL: 25 mg/dL (ref 0–40)

## 2013-12-01 MED ORDER — IPRATROPIUM BROMIDE 0.03 % NA SOLN: 2.0000 | Freq: Two times a day (BID) | NASAL | Status: DC

## 2013-12-01 MED ORDER — AMOXICILLIN 875 MG PO TABS: 875.0000 mg | ORAL_TABLET | Freq: Two times a day (BID) | ORAL | Status: DC

## 2013-12-01 MED ORDER — FIRST-DUKES MOUTHWASH MT SUSP: 10.0000 mL | OROMUCOSAL | Status: DC | PRN

## 2013-12-01 NOTE — Patient Instructions (Signed)
The antibiotic prescribed today is for your present infection only. It is very important to follow the directions for the medication prescribed. Antibiotics are generally given for a specified period of time (7-10 days, for example) to be taken at specific intervals (every 4, 6, 8 or 12 hours). This is necessary to keep the right amount of the medication in the bloodstream. Too much of the medication may cause an adverse reaction, too little may not be completely effective.  To clear your infection completely, continue taking the antibiotic for the full time of treatment, even if you begin to feel better after a few days.  If you miss a dose of the antibiotic, take it as soon as possible. Then go back to your regular dosing schedule. However, don't double up doses.     Though your symptoms are mild, your blood count indicates a bacterial infection.  I am going to start you on amoxicillin twice daily for 10 days  Continue taking Tylenol for your headache  Use the ipratropium nasal spray 2-3 times per day to help with congestion and runny nose  Use the Magic Mouthwash as frequently as every 2 hours if needed for sore throat  Try Delsym (available over the counter) if needed for cough  Plenty of fluids (water is best!) and rest  Please let us know if you are worsening or not improving

## 2013-12-01 NOTE — Progress Notes (Signed)
Subjective:    Patient ID: Lisa [name], female    DOB: 12/09/1970, 43 y.o.   MRN: [id number]  HPI   [name] is a very pleasant 43 yr old female here with concern for illness.  Reports that she began feeling unwell yesterday.  Symptoms include sore throat, HA, nasal congestion, dry cough.  She denies SOB or wheezing.  No fever.  Her appetite is decreased.  No known sick contacts  Additionally she has a concern for night sweats that have been going on for "awhile" - estimates about a year.  She wakes up sweating then feels chilled if she takes the blankets off.  The night sweats are not drenching - has never had to change clothes or sheets.  She has checked her temp during these episodes and is always afebrile.  Reports she has always been a "sweat-er."  Concerned that this could be menopause though she is not really experiencing hot flashes.  She is on Depo Provera x 10 yrs - no periods.  She denies weight loss.  She is a former smoker - quit Jan 2015 after 20 years.  CPE in Feb 2015 - at that time normal CBC, CMP, TSH.  Lipids elevated - started on zocor  Has future lab orders for FLP, LFTs   Review of Systems  Constitutional: Positive for diaphoresis (night sweats x months), appetite change (decreased x 1 day) and fatigue. Negative for chills and unexpected weight change.  HENT: Positive for congestion, rhinorrhea and sore throat. Negative for ear pain and postnasal drip.   Respiratory: Positive for cough. Negative for shortness of breath and wheezing.   Cardiovascular: Negative.   Gastrointestinal: Negative for nausea, vomiting and abdominal pain.  Musculoskeletal: Negative for arthralgias and myalgias.  Skin: Negative.   Neurological: Positive for headaches.       Objective:   Physical Exam  Vitals reviewed. Constitutional: She is oriented to person, place, and time. She appears well-developed and well-nourished. No distress.  HENT:  Head: Normocephalic and atraumatic.  Right  Ear: Tympanic membrane and ear canal normal.  Left Ear: Tympanic membrane and ear canal normal.  Mouth/Throat: Uvula is midline and mucous membranes are normal. Posterior oropharyngeal erythema present. No oropharyngeal exudate, posterior oropharyngeal edema or tonsillar abscesses.  Eyes: Conjunctivae are normal. No scleral icterus.  Neck: Neck supple.  Cardiovascular: Normal rate, regular rhythm and normal heart sounds.   Pulmonary/Chest: Effort normal and breath sounds normal. She has no wheezes. She has no rales.  Abdominal: Soft. There is no tenderness.  Lymphadenopathy:    She has no cervical adenopathy.  Neurological: She is alert and oriented to person, place, and time.  Skin: Skin is warm and dry.  Psychiatric: She has a normal mood and affect. Her behavior is normal.    Results for orders placed in visit on 12/01/13  POCT CBC      Result Value Ref Range   WBC 12.6 (*) 4.6 - 10.2 K/uL   Lymph, poc 1.8  0.6 - 3.4   POC LYMPH PERCENT 14.4  10 - 50 %L   MID (cbc) 0.5  0 - 0.9   POC MID % 4.2  0 - 12 %M   POC Granulocyte 10.3 (*) 2 - 6.9   Granulocyte percent 81.4 (*) 37 - 80 %G   RBC 4.51  4.04 - 5.48 M/uL   Hemoglobin 12.5  12.2 - 16.2 g/dL   HCT, POC 40.940.0  81.137.7 - 47.9 %   MCV  88.8  80 - 97 fL   MCH, POC 27.7  27 - 31.2 pg   MCHC 31.3 (*) 31.8 - 35.4 g/dL   RDW, POC 11.914.1     Platelet Count, POC 256  142 - 424 K/uL   MPV 9.5  0 - 99.8 fL       Assessment & Plan:  Acute upper respiratory infections of unspecified site - Plan: amoxicillin (AMOXIL) 875 MG tablet, ipratropium (ATROVENT) 0.03 % nasal spray, Diphenhyd-Hydrocort-Nystatin (FIRST-DUKES MOUTHWASH) SUSP  Leukocytosis, unspecified - Plan: amoxicillin (AMOXIL) 875 MG tablet  Night sweats - Plan: POCT CBC  Other and unspecified hyperlipidemia - Plan: Lipid panel, Hepatic Function Panel   (1)  [name] is a very pleasant 43 yr old female here with onset of URI symptoms yesterday - HA, congestion, ST.  She is  afebrile, throat mildly erythematous, lungs CTA bilaterally.  White count is elevated at 12,600 with shift.  Due to this will treat with amoxicillin.  Atrovent nasal and [facility]'s for symptom relief  (2)  Pt complains of night sweats x 1 yr.  These happen intermittently, not every night.  Never drenching.  She is afebrile during these times.  She has no other associated symptoms.  No recent weight loss - in fact has gained a few pounds.  CPE in Feb 2015 with normal CMP, TSH.  She is a former smoker but has no cough, wheeze, SOB and lungs are clear.  Discussed possible etiologies with pt - this certainly could be perimenopause, though pt is quite young; discussed more concerning etiologies - though without associated symptoms this seems less likely; trazodone can cause night sweats but pt's symptoms predated trazodone use; this certainly could be idiopathic as well.  At this point, I do not think we need to necessarily chase after this - she has had a normal lab work up thus far.  If symptoms are worsening, she develops new symptoms, or becomes more concerned would probably start with PPD an CXR, possibly CT chest/abd.  (3)  Pt due for FLP and LFTs which have been drawn today  Pt to call or RTC if worsening or not improving  E. Frances FurbishElizabeth Melisa Donofrio MHS, PA-C [facility] 4/21/201512:54 PM

## 2013-12-05 ENCOUNTER — Encounter: Payer: Self-pay | Admitting: Physician Assistant

## 2013-12-05 MED ORDER — GUAIFENESIN-CODEINE 200-10 MG/5ML PO LIQD: 5.0000 mL | Freq: Four times a day (QID) | ORAL | Status: DC | PRN

## 2013-12-05 NOTE — Telephone Encounter (Signed)
Rx printed. Patient notified via My Chart.  Meds ordered this encounter  Medications  . Guaifenesin-Codeine 200-10 MG/5ML LIQD    Sig: Take 5 mLs by mouth every 6 (six) hours as needed (cough).    Dispense:  100 mL    Refill:  0    Order Specific Question:  Supervising Provider    Answer:  DOOLITTLE, ROBERT P [[id number]]

## 2013-12-13 ENCOUNTER — Ambulatory Visit (INDEPENDENT_AMBULATORY_CARE_PROVIDER_SITE_OTHER): Payer: BC Managed Care – PPO | Admitting: Family Medicine

## 2013-12-13 DIAGNOSIS — Z309 Encounter for contraceptive management, unspecified: Secondary | ICD-10-CM

## 2013-12-13 DIAGNOSIS — IMO0001 Reserved for inherently not codable concepts without codable children: Secondary | ICD-10-CM

## 2013-12-13 MED ORDER — MEDROXYPROGESTERONE ACETATE 150 MG/ML IM SUSP
150.0000 mg | Freq: Once | INTRAMUSCULAR | Status: AC
  Administered 2013-12-13: 150 mg via INTRAMUSCULAR

## 2013-12-13 NOTE — Progress Notes (Signed)
   Subjective:    Patient ID: Dairl PonderKim M Lora, female    DOB: 05/30/1971, 43 y.o.   MRN: [id number]  HPI  Patient is here for her 3 month Depo shot she was here in Feb. so she's within her window   Review of Systems     Objective:   Physical Exam        Assessment & Plan:  Given depo shot on right side of glut patient will return back in august for depo shot

## 2014-01-05 ENCOUNTER — Other Ambulatory Visit: Payer: Self-pay | Admitting: Family Medicine

## 2014-01-08 NOTE — Telephone Encounter (Signed)
Called in rx

## 2014-01-12 ENCOUNTER — Other Ambulatory Visit: Payer: Self-pay | Admitting: Family Medicine

## 2014-01-12 ENCOUNTER — Encounter: Payer: Self-pay | Admitting: Family Medicine

## 2014-01-12 DIAGNOSIS — G4700 Insomnia, unspecified: Secondary | ICD-10-CM

## 2014-01-12 MED ORDER — TRAZODONE HCL 50 MG PO TABS: ORAL_TABLET | ORAL | Status: DC

## 2014-01-13 MED ORDER — LISINOPRIL-HYDROCHLOROTHIAZIDE 20-12.5 MG PO TABS: ORAL_TABLET | ORAL | Status: DC

## 2014-01-28 ENCOUNTER — Encounter: Payer: Self-pay | Admitting: Family Medicine

## 2014-02-03 ENCOUNTER — Telehealth: Payer: Self-pay | Admitting: Family Medicine

## 2014-02-03 NOTE — Telephone Encounter (Signed)
Received labs from [facility]- her LFTS are normal AST 15 ALT15

## 2014-03-16 ENCOUNTER — Encounter: Payer: Self-pay | Admitting: Family Medicine

## 2014-03-16 DIAGNOSIS — D72829 Elevated white blood cell count, unspecified: Secondary | ICD-10-CM

## 2014-03-20 ENCOUNTER — Ambulatory Visit (INDEPENDENT_AMBULATORY_CARE_PROVIDER_SITE_OTHER): Payer: BC Managed Care – PPO | Admitting: Physician Assistant

## 2014-03-20 DIAGNOSIS — Z309 Encounter for contraceptive management, unspecified: Secondary | ICD-10-CM

## 2014-03-20 DIAGNOSIS — Z789 Other specified health status: Secondary | ICD-10-CM

## 2014-03-20 DIAGNOSIS — D72829 Elevated white blood cell count, unspecified: Secondary | ICD-10-CM

## 2014-03-20 LAB — POCT CBC
Granulocyte percent: 53 %G (ref 37–80)
HCT, POC: 39.8 % (ref 37.7–47.9)
Hemoglobin: 12.6 g/dL (ref 12.2–16.2)
Lymph, poc: 2.1 (ref 0.6–3.4)
MCH, POC: 27.6 pg (ref 27–31.2)
MCHC: 31.7 g/dL — AB (ref 31.8–35.4)
MCV: 87.2 fL (ref 80–97)
MID (cbc): 0.4 (ref 0–0.9)
MPV: 8.7 fL (ref 0–99.8)
POC Granulocyte: 2.8 (ref 2–6.9)
POC LYMPH PERCENT: 39.5 %L (ref 10–50)
POC MID %: 7.5 %M (ref 0–12)
Platelet Count, POC: 143 10*3/uL (ref 142–424)
RBC: 4.56 M/uL (ref 4.04–5.48)
RDW, POC: 14.6 %
WBC: 5.3 10*3/uL (ref 4.6–10.2)

## 2014-03-20 LAB — POCT URINE PREGNANCY: Preg Test, Ur: NEGATIVE

## 2014-03-20 MED ORDER — MEDROXYPROGESTERONE ACETATE 150 MG/ML IM SUSP
150.0000 mg | Freq: Once | INTRAMUSCULAR | Status: AC
  Administered 2014-03-20: 150 mg via INTRAMUSCULAR

## 2014-03-20 NOTE — Progress Notes (Signed)
Patient here for depo provera injection and lab only visit. Not seen by a provider.

## 2014-03-20 NOTE — Progress Notes (Signed)
Patient here for depo provera injection. Hcg was negative.

## 2014-04-11 ENCOUNTER — Encounter: Payer: Self-pay | Admitting: Family Medicine

## 2014-04-11 DIAGNOSIS — G4700 Insomnia, unspecified: Secondary | ICD-10-CM

## 2014-04-11 MED ORDER — TRAZODONE HCL 50 MG PO TABS: ORAL_TABLET | ORAL | Status: DC

## 2014-04-13 ENCOUNTER — Other Ambulatory Visit: Payer: Self-pay | Admitting: Family Medicine

## 2014-05-24 ENCOUNTER — Encounter: Payer: Self-pay | Admitting: Family Medicine

## 2014-05-24 DIAGNOSIS — I10 Essential (primary) hypertension: Secondary | ICD-10-CM

## 2014-05-26 MED ORDER — LISINOPRIL-HYDROCHLOROTHIAZIDE 20-12.5 MG PO TABS: 1.0000 | ORAL_TABLET | Freq: Every day | ORAL | Status: DC

## 2014-05-28 ENCOUNTER — Other Ambulatory Visit: Payer: Self-pay

## 2014-06-11 ENCOUNTER — Ambulatory Visit (INDEPENDENT_AMBULATORY_CARE_PROVIDER_SITE_OTHER): Payer: BC Managed Care – PPO | Admitting: Family Medicine

## 2014-06-11 VITALS — BP 110/74 | HR 102 | Temp 98.3°F | Resp 18 | Ht 67.0 in | Wt 168.4 lb

## 2014-06-11 DIAGNOSIS — Z23 Encounter for immunization: Secondary | ICD-10-CM

## 2014-06-11 DIAGNOSIS — R197 Diarrhea, unspecified: Secondary | ICD-10-CM

## 2014-06-11 DIAGNOSIS — Z3040 Encounter for surveillance of contraceptives, unspecified: Secondary | ICD-10-CM

## 2014-06-11 DIAGNOSIS — I10 Essential (primary) hypertension: Secondary | ICD-10-CM

## 2014-06-11 LAB — COMPREHENSIVE METABOLIC PANEL
ALT: 18 U/L (ref 0–35)
AST: 16 U/L (ref 0–37)
Albumin: 4.3 g/dL (ref 3.5–5.2)
Alkaline Phosphatase: 52 U/L (ref 39–117)
BILIRUBIN TOTAL: 0.2 mg/dL (ref 0.2–1.2)
BUN: 10 mg/dL (ref 6–23)
CALCIUM: 9.3 mg/dL (ref 8.4–10.5)
CO2: 21 meq/L (ref 19–32)
CREATININE: 0.58 mg/dL (ref 0.50–1.10)
Chloride: 105 mEq/L (ref 96–112)
Glucose, Bld: 104 mg/dL — ABNORMAL HIGH (ref 70–99)
Potassium: 4.3 mEq/L (ref 3.5–5.3)
Sodium: 136 mEq/L (ref 135–145)
Total Protein: 6.8 g/dL (ref 6.0–8.3)

## 2014-06-11 MED ORDER — LISINOPRIL 10 MG PO TABS: 10.0000 mg | ORAL_TABLET | Freq: Every day | ORAL | Status: DC

## 2014-06-11 MED ORDER — MEDROXYPROGESTERONE ACETATE 150 MG/ML IM SUSP
150.0000 mg | Freq: Once | INTRAMUSCULAR | Status: AC
  Administered 2014-06-11: 150 mg via INTRAMUSCULAR

## 2014-06-11 MED ORDER — DIPHENOXYLATE-ATROPINE 2.5-0.025 MG PO TABS: ORAL_TABLET | ORAL | Status: DC

## 2014-06-11 NOTE — Progress Notes (Signed)
[facility] [address], [place] [id number] [phone]  Date:  06/11/2014   Name:  Lisa PonderKim M Irby   DOB:  11/05/1970   MRN:  [id number]  PCP:  No PCP Per Patient    Chief Complaint: Depo-Provera Shot, Follow-up and Flu Vaccine   History of Present Illness:  Lisa [name] is a 43 y.o. very pleasant female patient who presents with the following:  Here today for follow-up.  She recently had a cold but this is now cleared up.   Would like a flu shot and also her depo- last shot 03/20/2014 so she is in her window  She does not generally check her BP  She is having a mammogram done on Monday.    BP Readings from Last 3 Encounters:  06/11/14 110/74  12/01/13 100/60  09/21/13 110/60     Patient Active Problem List   Diagnosis Date Noted  . History of abnormal cervical Pap smear 09/21/2013  . Leukocytopenia, unspecified 03/02/2013  . Avascular necrosis of bone of left hip 02/06/2013  . Hyperlipidemia 10/13/2012  . HTN (hypertension) 10/13/2012    Past Medical History  Diagnosis Date  . Hyperlipidemia   . Hypertension   . Ulcer     x2  . Heart murmur   . Anxiety 20 years ago  . Depression 20 years ago  . GERD (gastroesophageal reflux disease)   . Anemia     hx of    Past Surgical History  Procedure Laterality Date  . No past surgeries    . Esophagogastroduodenoscopy endoscopy      at least 4  . Total hip arthroplasty Left 02/06/2013    Procedure: LEFT TOTAL HIP ARTHROPLASTY ANTERIOR APPROACH;  Surgeon: Kathryne Hitchhristopher Y Blackman, MD;  Location: [facility];  Service: Orthopedics;  Laterality: Left;    History  Substance Use Topics  . Smoking status: Former Smoker -- 0.25 packs/day for 20 years    Types: Cigarettes    Quit date: 08/13/2013  . Smokeless tobacco: Never Used  . Alcohol Use: 0.0 oz/week     Comment: few glasses of wine a week    Family History  Problem Relation Age of Onset  . Adopted: Yes    Allergies  Allergen Reactions   . Nsaids Other (See Comments)    ulcers    Medication list has been reviewed and updated.  Current Outpatient Prescriptions on File Prior to Visit  Medication Sig Dispense Refill  . diphenoxylate-atropine (LOMOTIL) 2.5-0.025 MG per tablet TAKE 1 TABLET BY MOUTH 3 TIMES A DAY AS NEEDED FOR DIARRHEA OR LOOSE STOOLS  60 tablet  3  . lisinopril-hydrochlorothiazide (PRINZIDE,ZESTORETIC) 20-12.5 MG per tablet TAKE 1 TABLET BY MOUTH DAILY.  90 tablet  0  . lisinopril-hydrochlorothiazide (PRINZIDE,ZESTORETIC) 20-12.5 MG per tablet Take 1 tablet by mouth daily. PATIENT NEEDS BP CHECK UP FOR ADDITIONAL REFILLS  30 tablet  0  . omega-3 acid ethyl esters (LOVAZA) 1 G capsule Take 1 g by mouth at bedtime.      Marland Kitchen. OVER THE COUNTER MEDICATION [employer] Brand Sleep aid as needed      . pantoprazole (PROTONIX) 40 MG tablet Take 40 mg by mouth every morning.      . simvastatin (ZOCOR) 20 MG tablet Take 1 tablet (20 mg total) by mouth every evening.  90 tablet  2  . traZODone (DESYREL) 50 MG tablet TAKE 1/2-1 TABLETS (25-50 MG TOTAL) BY MOUTH AT BEDTIME AS NEEDED FOR SLEEP.  90  tablet  2  . Guaifenesin-Codeine 200-10 MG/5ML LIQD Take 5 mLs by mouth every 6 (six) hours as needed (cough).  100 mL  0  . HYDROcodone-acetaminophen (NORCO/VICODIN) 5-325 MG per tablet Take 1-2 tablets by mouth every 12 (twelve) hours as needed for pain.      Marland Kitchen. ipratropium (ATROVENT) 0.03 % nasal spray Place 2 sprays into the nose 2 (two) times daily.  30 mL  1   No current facility-administered medications on file prior to visit.    Review of Systems:  As per HPI- otherwise negative.   Physical Examination: Filed Vitals:   06/11/14 1540  BP: 110/74  Pulse: 102  Temp: 98.3 F (36.8 C)  Resp: 18   Filed Vitals:   06/11/14 1540  Height: 5\' 7"  (1.702 m)  Weight: 168 lb 6.4 oz (76.386 kg)   Body mass index is 26.37 kg/(m^2). Ideal Body Weight: Weight in (lb) to have BMI = 25: 159.3  GEN: WDWN, NAD, Non-toxic, A & O x 3,  looks well HEENT: Atraumatic, Normocephalic. Neck supple. No masses, No LAD. Ears and Nose: No external deformity. CV: RRR, No M/G/R. No JVD. No thrill. No extra heart sounds. PULM: CTA B, no wheezes, crackles, rhonchi. No retractions. No resp. distress. No accessory muscle use. ABD: S, NT, ND, +BS. No rebound. No HSM. EXTR: No c/c/e NEURO Normal gait.  PSYCH: Normally interactive. Conversant. Not depressed or anxious appearing.  Calm demeanor.    Assessment and Plan: Essential hypertension - Plan: Comprehensive metabolic panel, lisinopril (PRINIVIL,ZESTRIL) 10 MG tablet  Diarrhea - Plan: diphenoxylate-atropine (LOMOTIL) 2.5-0.025 MG per tablet  Immunization due - Plan: Flu Vaccine QUAD 36+ mos IM  Encounter for surveillance of contraceptives - Plan: medroxyPROGESTERone (DEPO-PROVERA) injection 150 mg  Will decrease her HTN meds as her BP is running well under control.  She knows not to become pregnant while on lisinopril Depo shot Refilled her lomotil that she uses prn Flu shot   Signed Abbe AmsterdamJessica Copland, MD

## 2014-06-11 NOTE — Patient Instructions (Signed)
We are going to change your BP medication to plain lisinopril (instead of the lisinopril/HCTZ combo that you were on) because your BP looks great.  Remember this medication does NOT mix with pregnancy.   I will send you your lab results.  Keep an eye on your blood pressure and let me know if you notice any readings over 140/85

## 2014-06-30 ENCOUNTER — Encounter: Payer: Self-pay | Admitting: Family Medicine

## 2014-08-10 ENCOUNTER — Other Ambulatory Visit: Payer: Self-pay | Admitting: Family Medicine

## 2014-09-12 ENCOUNTER — Ambulatory Visit (INDEPENDENT_AMBULATORY_CARE_PROVIDER_SITE_OTHER): Payer: BLUE CROSS/BLUE SHIELD | Admitting: Physician Assistant

## 2014-09-12 VITALS — BP 110/82

## 2014-09-12 DIAGNOSIS — Z3042 Encounter for surveillance of injectable contraceptive: Secondary | ICD-10-CM

## 2014-09-12 DIAGNOSIS — Z3049 Encounter for surveillance of other contraceptives: Secondary | ICD-10-CM

## 2014-09-12 MED ORDER — MEDROXYPROGESTERONE ACETATE 150 MG/ML IM SUSP
150.0000 mg | Freq: Once | INTRAMUSCULAR | Status: AC
Start: 2014-09-12 — End: 2014-09-12
  Administered 2014-09-12: 150 mg via INTRAMUSCULAR

## 2014-09-12 NOTE — Progress Notes (Signed)
Pt is here for her Depo-provera.  She is 2 days late for her injection.  She has not had sex in >4 years and she is on Depo due to irregular menses.  She likes the depo and is not having problems with it.  Her last pap was in 09/2013 and was normal.

## 2014-09-23 ENCOUNTER — Ambulatory Visit (INDEPENDENT_AMBULATORY_CARE_PROVIDER_SITE_OTHER): Payer: BLUE CROSS/BLUE SHIELD | Admitting: Family Medicine

## 2014-09-23 VITALS — BP 118/78 | HR 90 | Temp 98.2°F | Resp 18 | Ht 67.25 in | Wt 164.0 lb

## 2014-09-23 DIAGNOSIS — J029 Acute pharyngitis, unspecified: Secondary | ICD-10-CM

## 2014-09-23 DIAGNOSIS — R5383 Other fatigue: Secondary | ICD-10-CM

## 2014-09-23 LAB — POCT INFLUENZA A/B
INFLUENZA A, POC: NEGATIVE
INFLUENZA B, POC: NEGATIVE

## 2014-09-23 LAB — POCT RAPID STREP A (OFFICE): RAPID STREP A SCREEN: NEGATIVE

## 2014-09-23 NOTE — Patient Instructions (Signed)
It looks like you have a bad viral infection (cold) Rest and drink plenty of fluids, tylenol as needed Let me know if you do not feel better in the next couple of days- Sooner if worse.

## 2014-09-23 NOTE — Progress Notes (Signed)
[facility] [address], [place] [id number] [phone]  Date:  09/23/2014   Name:  Lisa PonderKim M Moro   DOB:  09/01/1970   MRN:  [id number]  PCP:  Abbe AmsterdamOPLAND,Haydyn Girvan, MD    Chief Complaint: Sore Throat; Nasal Congestion; Cough; and Fatigue   History of Present Illness:  Lisa [name] is a 44 y.o. very pleasant female patient who presents with the following:  Here today with illness- today is Thursday.  On Monday she notes a ST, pain with swallowing.  She had to leave work early on Tuesday as she felt so bad.   She tried eating some soup, felt bad again yesterday and slept all day.  She noted a temperature of 100 when she awoke from her nap.   This am she felt better but she noted fatigue and body aches.   Her ears feel full and sore, her neck hurts when she swallows.   She is also coughing, sneezing. Poor appetite.   She did have a flu shot this year. She had a little diarrhea yesterday, but she has IBS so she is not sure what this meant.  No abdominal pain or vomiting   She took some tylenol this am around 0630.     Patient Active Problem List   Diagnosis Date Noted  . History of abnormal cervical Pap smear 09/21/2013  . Leukocytopenia, unspecified 03/02/2013  . Avascular necrosis of bone of left hip 02/06/2013  . Hyperlipidemia 10/13/2012  . HTN (hypertension) 10/13/2012    Past Medical History  Diagnosis Date  . Hyperlipidemia   . Hypertension   . Ulcer     x2  . Heart murmur   . Anxiety 20 years ago  . Depression 20 years ago  . GERD (gastroesophageal reflux disease)   . Anemia     hx of    Past Surgical History  Procedure Laterality Date  . No past surgeries    . Esophagogastroduodenoscopy endoscopy      at least 4  . Total hip arthroplasty Left 02/06/2013    Procedure: LEFT TOTAL HIP ARTHROPLASTY ANTERIOR APPROACH;  Surgeon: Kathryne Hitchhristopher Y Blackman, MD;  Location: [facility];  Service: Orthopedics;  Laterality: Left;    History  Substance  Use Topics  . Smoking status: Former Smoker -- 0.25 packs/day for 20 years    Types: Cigarettes    Quit date: 08/13/2013  . Smokeless tobacco: Never Used  . Alcohol Use: 0.0 oz/week     Comment: few glasses of wine a week    Family History  Problem Relation Age of Onset  . Adopted: Yes    Allergies  Allergen Reactions  . Nsaids Other (See Comments)    ulcers    Medication list has been reviewed and updated.  Current Outpatient Prescriptions on File Prior to Visit  Medication Sig Dispense Refill  . diphenoxylate-atropine (LOMOTIL) 2.5-0.025 MG per tablet TAKE 1 TABLET BY MOUTH 3 TIMES A DAY AS NEEDED FOR DIARRHEA OR LOOSE STOOLS 60 tablet 3  . lisinopril (PRINIVIL,ZESTRIL) 10 MG tablet Take 1 tablet (10 mg total) by mouth daily. 90 tablet 3  . OVER THE COUNTER MEDICATION [employer] Brand Sleep aid as needed    . pantoprazole (PROTONIX) 40 MG tablet Take 40 mg by mouth every morning.    . simvastatin (ZOCOR) 20 MG tablet TAKE 1 TABLET BY MOUTH EVERY EVENING 90 tablet 2  . traZODone (DESYREL) 50 MG tablet TAKE 1/2-1 TABLETS (25-50 MG TOTAL) BY  MOUTH AT BEDTIME AS NEEDED FOR SLEEP. 90 tablet 2   No current facility-administered medications on file prior to visit.    Review of Systems:  As per HPI- otherwise negative.   Physical Examination: Filed Vitals:   09/23/14 0825  BP: 118/78  Pulse: 118  Temp: 98.2 F (36.8 C)  Resp: 18   Filed Vitals:   09/23/14 0825  Height: 5' 7.25" (1.708 m)  Weight: 164 lb (74.39 kg)   Body mass index is 25.5 kg/(m^2). Ideal Body Weight: Weight in (lb) to have BMI = 25: 160.5  GEN: WDWN, NAD, Non-toxic, A & O x 3, looks well HEENT: Atraumatic, Normocephalic. Neck supple. No masses, No LAD.  Bilateral TM wnl, oropharynx normal.  PEERL,EOMI.   Ears and Nose: No external deformity. CV: RRR, No M/G/R. No JVD. No thrill. No extra heart sounds. PULM: CTA B, no wheezes, crackles, rhonchi. No retractions. No resp. distress. No accessory muscle  use. ABD: S, NT, ND EXTR: No c/c/e NEURO Normal gait.  PSYCH: Normally interactive. Conversant. Not depressed or anxious appearing.  Calm demeanor.   Results for orders placed or performed in visit on 09/23/14  POCT Influenza A/B  Result Value Ref Range   Influenza A, POC Negative    Influenza B, POC Negative   POCT rapid strep A  Result Value Ref Range   Rapid Strep A Screen Negative Negative    Assessment and Plan: Other fatigue - Plan: POCT Influenza A/B, POCT rapid strep A  Sore throat - Plan: POCT Influenza A/B, POCT rapid strep A  Treat for a viral URI with OTC medications as needed.  Encouraged rest and fluids.  She will let me know if not feeling better over the next 2-3 days   Signed Abbe Amsterdam, MD

## 2014-10-03 ENCOUNTER — Encounter: Payer: Self-pay | Admitting: Family Medicine

## 2014-10-03 DIAGNOSIS — G4700 Insomnia, unspecified: Secondary | ICD-10-CM

## 2014-10-04 MED ORDER — TRAZODONE HCL 50 MG PO TABS: ORAL_TABLET | ORAL | Status: DC

## 2014-12-10 ENCOUNTER — Encounter: Payer: Self-pay | Admitting: Family Medicine

## 2014-12-10 DIAGNOSIS — Z5181 Encounter for therapeutic drug level monitoring: Secondary | ICD-10-CM

## 2014-12-10 DIAGNOSIS — E785 Hyperlipidemia, unspecified: Secondary | ICD-10-CM

## 2014-12-10 MED ORDER — SIMVASTATIN 20 MG PO TABS: 20.0000 mg | ORAL_TABLET | Freq: Every evening | ORAL | Status: DC

## 2014-12-11 ENCOUNTER — Ambulatory Visit (INDEPENDENT_AMBULATORY_CARE_PROVIDER_SITE_OTHER): Payer: BLUE CROSS/BLUE SHIELD | Admitting: Physician Assistant

## 2014-12-11 DIAGNOSIS — Z3049 Encounter for surveillance of other contraceptives: Secondary | ICD-10-CM

## 2014-12-11 DIAGNOSIS — E785 Hyperlipidemia, unspecified: Secondary | ICD-10-CM

## 2014-12-11 DIAGNOSIS — Z3042 Encounter for surveillance of injectable contraceptive: Secondary | ICD-10-CM

## 2014-12-11 LAB — COMPREHENSIVE METABOLIC PANEL
ALBUMIN: 4.5 g/dL (ref 3.5–5.2)
ALK PHOS: 49 U/L (ref 39–117)
ALT: 15 U/L (ref 0–35)
AST: 16 U/L (ref 0–37)
BUN: 9 mg/dL (ref 6–23)
CALCIUM: 9.5 mg/dL (ref 8.4–10.5)
CO2: 23 mEq/L (ref 19–32)
Chloride: 106 mEq/L (ref 96–112)
Creat: 0.55 mg/dL (ref 0.50–1.10)
Glucose, Bld: 103 mg/dL — ABNORMAL HIGH (ref 70–99)
POTASSIUM: 4.5 meq/L (ref 3.5–5.3)
Sodium: 139 mEq/L (ref 135–145)
TOTAL PROTEIN: 7 g/dL (ref 6.0–8.3)
Total Bilirubin: 0.4 mg/dL (ref 0.2–1.2)

## 2014-12-11 LAB — LIPID PANEL
CHOL/HDL RATIO: 3.7 ratio
Cholesterol: 216 mg/dL — ABNORMAL HIGH (ref 0–200)
HDL: 58 mg/dL (ref >=46)
LDL Cholesterol: 142 mg/dL — ABNORMAL HIGH (ref 0–99)
TRIGLYCERIDES: 80 mg/dL (ref <150)
VLDL: 16 mg/dL (ref 0–40)

## 2014-12-11 LAB — CBC
HCT: 37.6 % (ref 36.0–46.0)
Hemoglobin: 12.2 g/dL (ref 12.0–15.0)
MCH: 28.4 pg (ref 26.0–34.0)
MCHC: 32.4 g/dL (ref 30.0–36.0)
MCV: 87.6 fL (ref 78.0–100.0)
MPV: 10.8 fL (ref 8.6–12.4)
PLATELETS: 217 10*3/uL (ref 150–400)
RBC: 4.29 MIL/uL (ref 3.87–5.11)
RDW: 13.7 % (ref 11.5–15.5)
WBC: 5.3 10*3/uL (ref 4.0–10.5)

## 2014-12-11 MED ORDER — METHYLPREDNISOLONE ACETATE 80 MG/ML IJ SUSP: 80.0000 mg | Freq: Once | INTRAMUSCULAR | Status: DC

## 2014-12-11 MED ORDER — MEDROXYPROGESTERONE ACETATE 150 MG/ML IM SUSP
150.0000 mg | Freq: Once | INTRAMUSCULAR | Status: AC
  Administered 2014-12-11: 150 mg via INTRAMUSCULAR

## 2014-12-11 NOTE — Progress Notes (Signed)
Pt here for depo shot and labs only.

## 2014-12-15 ENCOUNTER — Encounter: Payer: Self-pay | Admitting: Family Medicine

## 2015-02-23 ENCOUNTER — Emergency Department (HOSPITAL_COMMUNITY): Payer: BLUE CROSS/BLUE SHIELD | Admitting: Certified Registered Nurse Anesthetist

## 2015-02-23 ENCOUNTER — Observation Stay (HOSPITAL_COMMUNITY)
Admission: EM | Admit: 2015-02-23 | Discharge: 2015-02-24 | Disposition: A | Discharge status: Home / Self Care | Payer: BLUE CROSS/BLUE SHIELD | Attending: Surgery | Admitting: Surgery

## 2015-02-23 ENCOUNTER — Encounter (HOSPITAL_COMMUNITY): Admission: EM | Disposition: A | Discharge status: Home / Self Care | Payer: Self-pay | Source: Home / Self Care

## 2015-02-23 ENCOUNTER — Ambulatory Visit
Admission: RE | Admit: 2015-02-23 | Discharge: 2015-02-23 | Disposition: A | Discharge status: Home / Self Care | Payer: BLUE CROSS/BLUE SHIELD | Source: Ambulatory Visit | Attending: Family Medicine | Admitting: Family Medicine

## 2015-02-23 ENCOUNTER — Encounter (HOSPITAL_COMMUNITY): Payer: Self-pay | Admitting: Emergency Medicine

## 2015-02-23 ENCOUNTER — Ambulatory Visit (INDEPENDENT_AMBULATORY_CARE_PROVIDER_SITE_OTHER): Payer: BLUE CROSS/BLUE SHIELD | Admitting: Family Medicine

## 2015-02-23 VITALS — BP 100/60 | HR 98 | Temp 98.4°F | Resp 16 | Ht 67.5 in | Wt 165.0 lb

## 2015-02-23 DIAGNOSIS — D649 Anemia, unspecified: Secondary | ICD-10-CM | POA: Diagnosis not present

## 2015-02-23 DIAGNOSIS — Z96642 Presence of left artificial hip joint: Secondary | ICD-10-CM | POA: Insufficient documentation

## 2015-02-23 DIAGNOSIS — R1031 Right lower quadrant pain: Secondary | ICD-10-CM

## 2015-02-23 DIAGNOSIS — K3580 Unspecified acute appendicitis: Principal | ICD-10-CM | POA: Diagnosis present

## 2015-02-23 DIAGNOSIS — I10 Essential (primary) hypertension: Secondary | ICD-10-CM | POA: Insufficient documentation

## 2015-02-23 DIAGNOSIS — K219 Gastro-esophageal reflux disease without esophagitis: Secondary | ICD-10-CM | POA: Diagnosis not present

## 2015-02-23 DIAGNOSIS — Z888 Allergy status to other drugs, medicaments and biological substances status: Secondary | ICD-10-CM | POA: Diagnosis not present

## 2015-02-23 DIAGNOSIS — Z87891 Personal history of nicotine dependence: Secondary | ICD-10-CM | POA: Diagnosis not present

## 2015-02-23 DIAGNOSIS — F329 Major depressive disorder, single episode, unspecified: Secondary | ICD-10-CM | POA: Diagnosis not present

## 2015-02-23 DIAGNOSIS — F419 Anxiety disorder, unspecified: Secondary | ICD-10-CM | POA: Insufficient documentation

## 2015-02-23 DIAGNOSIS — E785 Hyperlipidemia, unspecified: Secondary | ICD-10-CM | POA: Diagnosis not present

## 2015-02-23 DIAGNOSIS — R011 Cardiac murmur, unspecified: Secondary | ICD-10-CM | POA: Diagnosis not present

## 2015-02-23 HISTORY — PX: LAPAROSCOPIC APPENDECTOMY: SHX408

## 2015-02-23 LAB — POCT CBC
GRANULOCYTE PERCENT: 60.9 % (ref 37–80)
HCT, POC: 39 % (ref 37.7–47.9)
HEMOGLOBIN: 12.6 g/dL (ref 12.2–16.2)
Lymph, poc: 2.3 (ref 0.6–3.4)
MCH, POC: 27.6 pg (ref 27–31.2)
MCHC: 32.4 g/dL (ref 31.8–35.4)
MCV: 85.2 fL (ref 80–97)
MID (CBC): 0.4 (ref 0–0.9)
MPV: 7.9 fL (ref 0–99.8)
POC Granulocyte: 4.2 (ref 2–6.9)
POC LYMPH PERCENT: 33.8 %L (ref 10–50)
POC MID %: 5.3 % (ref 0–12)
Platelet Count, POC: 209 10*3/uL (ref 142–424)
RBC: 4.57 M/uL (ref 4.04–5.48)
RDW, POC: 13.5 %
WBC: 6.9 10*3/uL (ref 4.6–10.2)

## 2015-02-23 LAB — BASIC METABOLIC PANEL
Anion gap: 7 (ref 5–15)
BUN: 10 mg/dL (ref 6–20)
CO2: 23 mmol/L (ref 22–32)
Calcium: 9.5 mg/dL (ref 8.9–10.3)
Chloride: 106 mmol/L (ref 101–111)
Creatinine, Ser: 0.58 mg/dL (ref 0.44–1.00)
GFR calc Af Amer: >60 mL/min (ref >60)
Glucose, Bld: 101 mg/dL — ABNORMAL HIGH (ref 65–99)
Potassium: 4.5 mmol/L (ref 3.5–5.1)
SODIUM: 136 mmol/L (ref 135–145)

## 2015-02-23 LAB — POCT UA - MICROSCOPIC ONLY
Bacteria, U Microscopic: NEGATIVE
CASTS, UR, LPF, POC: NEGATIVE
CRYSTALS, UR, HPF, POC: NEGATIVE
EPITHELIAL CELLS, URINE PER MICROSCOPY: NEGATIVE
Mucus, UA: NEGATIVE
RBC, urine, microscopic: NEGATIVE
WBC, UR, HPF, POC: NEGATIVE
Yeast, UA: NEGATIVE

## 2015-02-23 LAB — CBC
HCT: 37.7 % (ref 36.0–46.0)
Hemoglobin: 12.3 g/dL (ref 12.0–15.0)
MCH: 28.3 pg (ref 26.0–34.0)
MCHC: 32.6 g/dL (ref 30.0–36.0)
MCV: 86.9 fL (ref 78.0–100.0)
PLATELETS: 197 10*3/uL (ref 150–400)
RBC: 4.34 MIL/uL (ref 3.87–5.11)
RDW: 12.8 % (ref 11.5–15.5)
WBC: 9.9 10*3/uL (ref 4.0–10.5)

## 2015-02-23 LAB — POCT URINALYSIS DIPSTICK
BILIRUBIN UA: NEGATIVE
GLUCOSE UA: NEGATIVE
Ketones, UA: NEGATIVE
Leukocytes, UA: NEGATIVE
Nitrite, UA: NEGATIVE
Protein, UA: NEGATIVE
RBC UA: NEGATIVE
SPEC GRAV UA: 1.015
UROBILINOGEN UA: 0.2
pH, UA: 6.5

## 2015-02-23 LAB — POCT URINE PREGNANCY: Preg Test, Ur: NEGATIVE

## 2015-02-23 LAB — TYPE AND SCREEN
ABO/RH(D): O POS
Antibody Screen: NEGATIVE

## 2015-02-23 SURGERY — APPENDECTOMY, LAPAROSCOPIC
Anesthesia: General | Site: Abdomen

## 2015-02-23 MED ORDER — PROPOFOL 10 MG/ML IV BOLUS
INTRAVENOUS | Status: AC
  Filled 2015-02-23: qty 20

## 2015-02-23 MED ORDER — NEOSTIGMINE METHYLSULFATE 10 MG/10ML IV SOLN
INTRAVENOUS | Status: DC | PRN
  Administered 2015-02-23: 3.5 mg via INTRAVENOUS

## 2015-02-23 MED ORDER — ONDANSETRON HCL 4 MG/2ML IJ SOLN
INTRAMUSCULAR | Status: AC
  Filled 2015-02-23: qty 2

## 2015-02-23 MED ORDER — NEOSTIGMINE METHYLSULFATE 10 MG/10ML IV SOLN
INTRAVENOUS | Status: AC
  Filled 2015-02-23: qty 1

## 2015-02-23 MED ORDER — DEXAMETHASONE SODIUM PHOSPHATE 10 MG/ML IJ SOLN
INTRAMUSCULAR | Status: DC | PRN
  Administered 2015-02-23: 10 mg via INTRAVENOUS

## 2015-02-23 MED ORDER — ONDANSETRON HCL 4 MG/2ML IJ SOLN
INTRAMUSCULAR | Status: DC | PRN
  Administered 2015-02-23: 4 mg via INTRAVENOUS

## 2015-02-23 MED ORDER — LACTATED RINGERS IR SOLN
Status: DC | PRN
  Administered 2015-02-23: 1000 mL

## 2015-02-23 MED ORDER — BUPIVACAINE-EPINEPHRINE 0.25% -1:200000 IJ SOLN
INTRAMUSCULAR | Status: AC
  Filled 2015-02-23: qty 1

## 2015-02-23 MED ORDER — METRONIDAZOLE IN NACL 5-0.79 MG/ML-% IV SOLN
500.0000 mg | Freq: Once | INTRAVENOUS | Status: AC
  Administered 2015-02-23: 500 mg via INTRAVENOUS
  Filled 2015-02-23: qty 100

## 2015-02-23 MED ORDER — SUCCINYLCHOLINE CHLORIDE 20 MG/ML IJ SOLN
INTRAMUSCULAR | Status: DC | PRN
  Administered 2015-02-23: 80 mg via INTRAVENOUS

## 2015-02-23 MED ORDER — FENTANYL CITRATE (PF) 100 MCG/2ML IJ SOLN
INTRAMUSCULAR | Status: DC | PRN
  Administered 2015-02-23 (×3): 50 ug via INTRAVENOUS
  Administered 2015-02-23: 100 ug via INTRAVENOUS

## 2015-02-23 MED ORDER — PROPOFOL 10 MG/ML IV BOLUS
INTRAVENOUS | Status: DC | PRN
  Administered 2015-02-23: 150 mg via INTRAVENOUS

## 2015-02-23 MED ORDER — MIDAZOLAM HCL 5 MG/5ML IJ SOLN
INTRAMUSCULAR | Status: DC | PRN
  Administered 2015-02-23 (×2): 2 mg via INTRAVENOUS

## 2015-02-23 MED ORDER — LIDOCAINE HCL (CARDIAC) 20 MG/ML IV SOLN
INTRAVENOUS | Status: AC
  Filled 2015-02-23: qty 5

## 2015-02-23 MED ORDER — CIPROFLOXACIN IN D5W 400 MG/200ML IV SOLN
400.0000 mg | Freq: Once | INTRAVENOUS | Status: AC
  Administered 2015-02-23: 400 mg via INTRAVENOUS
  Filled 2015-02-23: qty 200

## 2015-02-23 MED ORDER — LACTATED RINGERS IV SOLN: INTRAVENOUS | Status: DC

## 2015-02-23 MED ORDER — HYDROMORPHONE HCL 1 MG/ML IJ SOLN
INTRAMUSCULAR | Status: DC | PRN
  Administered 2015-02-23 (×2): 0.5 mg via INTRAVENOUS
  Administered 2015-02-23: 1 mg via INTRAVENOUS

## 2015-02-23 MED ORDER — ROCURONIUM BROMIDE 100 MG/10ML IV SOLN
INTRAVENOUS | Status: AC
Start: 2015-02-23 — End: 2015-02-23
  Filled 2015-02-23: qty 1

## 2015-02-23 MED ORDER — MIDAZOLAM HCL 2 MG/2ML IJ SOLN
INTRAMUSCULAR | Status: AC
Start: 2015-02-23 — End: 2015-02-23
  Filled 2015-02-23: qty 2

## 2015-02-23 MED ORDER — IOPAMIDOL (ISOVUE-300) INJECTION 61%
100.0000 mL | Freq: Once | INTRAVENOUS | Status: AC | PRN
  Administered 2015-02-23: 100 mL via INTRAVENOUS

## 2015-02-23 MED ORDER — MEPERIDINE HCL 50 MG/ML IJ SOLN
12.5000 mg | Freq: Once | INTRAMUSCULAR | Status: AC
  Administered 2015-02-23: 12.5 mg via INTRAVENOUS

## 2015-02-23 MED ORDER — HYDROMORPHONE HCL 1 MG/ML IJ SOLN
INTRAMUSCULAR | Status: AC
  Administered 2015-02-24: 0.5 mg via INTRAVENOUS
  Filled 2015-02-23: qty 1

## 2015-02-23 MED ORDER — 0.9 % SODIUM CHLORIDE (POUR BTL) OPTIME
TOPICAL | Status: DC | PRN
  Administered 2015-02-23: 1000 mL

## 2015-02-23 MED ORDER — LIDOCAINE HCL (CARDIAC) 20 MG/ML IV SOLN
INTRAVENOUS | Status: DC | PRN
  Administered 2015-02-23: 50 mg via INTRAVENOUS

## 2015-02-23 MED ORDER — LACTATED RINGERS IV SOLN
INTRAVENOUS | Status: DC | PRN
  Administered 2015-02-23: 23:00:00 via INTRAVENOUS

## 2015-02-23 MED ORDER — FENTANYL CITRATE (PF) 250 MCG/5ML IJ SOLN
INTRAMUSCULAR | Status: AC
  Filled 2015-02-23: qty 5

## 2015-02-23 MED ORDER — GLYCOPYRROLATE 0.2 MG/ML IJ SOLN
INTRAMUSCULAR | Status: AC
  Filled 2015-02-23: qty 3

## 2015-02-23 MED ORDER — MEPERIDINE HCL 50 MG/ML IJ SOLN
INTRAMUSCULAR | Status: AC
  Filled 2015-02-23: qty 1

## 2015-02-23 MED ORDER — HYDROMORPHONE HCL 1 MG/ML IJ SOLN
0.2500 mg | INTRAMUSCULAR | Status: DC | PRN
  Administered 2015-02-23 – 2015-02-24 (×4): 0.5 mg via INTRAVENOUS

## 2015-02-23 MED ORDER — FENTANYL CITRATE (PF) 100 MCG/2ML IJ SOLN
50.0000 ug | Freq: Once | INTRAMUSCULAR | Status: AC
  Administered 2015-02-23: 50 ug via INTRAVENOUS
  Filled 2015-02-23: qty 2

## 2015-02-23 MED ORDER — DEXAMETHASONE SODIUM PHOSPHATE 10 MG/ML IJ SOLN
INTRAMUSCULAR | Status: AC
  Filled 2015-02-23: qty 1

## 2015-02-23 MED ORDER — HYDROMORPHONE HCL 2 MG/ML IJ SOLN
INTRAMUSCULAR | Status: AC
  Filled 2015-02-23: qty 1

## 2015-02-23 MED ORDER — GLYCOPYRROLATE 0.2 MG/ML IJ SOLN
INTRAMUSCULAR | Status: DC | PRN
  Administered 2015-02-23: .6 mg via INTRAVENOUS

## 2015-02-23 MED ORDER — MIDAZOLAM HCL 2 MG/2ML IJ SOLN
INTRAMUSCULAR | Status: AC
  Filled 2015-02-23: qty 2

## 2015-02-23 MED ORDER — SODIUM CHLORIDE 0.9 % IV SOLN
INTRAVENOUS | Status: DC | PRN
  Administered 2015-02-23: 22:00:00 via INTRAVENOUS

## 2015-02-23 MED ORDER — ROCURONIUM BROMIDE 100 MG/10ML IV SOLN
INTRAVENOUS | Status: DC | PRN
  Administered 2015-02-23: 30 mg via INTRAVENOUS

## 2015-02-23 SURGICAL SUPPLY — 43 items
ADH SKN CLS APL DERMABOND .7 (GAUZE/BANDAGES/DRESSINGS) ×1
APPLIER CLIP 5 13 M/L LIGAMAX5 (MISCELLANEOUS)
APPLIER CLIP ROT 10 11.4 M/L (STAPLE)
APR CLP MED LRG 11.4X10 (STAPLE)
APR CLP MED LRG 5 ANG JAW (MISCELLANEOUS)
BAG SPEC RTRVL LRG 6X4 10 (ENDOMECHANICALS) ×1
CLIP APPLIE 5 13 M/L LIGAMAX5 (MISCELLANEOUS) IMPLANT
CLIP APPLIE ROT 10 11.4 M/L (STAPLE) IMPLANT
COVER SURGICAL LIGHT HANDLE (MISCELLANEOUS) ×2 IMPLANT
CUTTER FLEX LINEAR 45M (STAPLE) ×1 IMPLANT
DECANTER SPIKE VIAL GLASS SM (MISCELLANEOUS) ×2 IMPLANT
DERMABOND ADVANCED (GAUZE/BANDAGES/DRESSINGS) ×1
DERMABOND ADVANCED .7 DNX12 (GAUZE/BANDAGES/DRESSINGS) IMPLANT
DRAPE LAPAROSCOPIC ABDOMINAL (DRAPES) ×2 IMPLANT
DRAPE UTILITY XL STRL (DRAPES) ×2 IMPLANT
ELECT REM PT RETURN 9FT ADLT (ELECTROSURGICAL) ×2
ELECTRODE REM PT RTRN 9FT ADLT (ELECTROSURGICAL) ×1 IMPLANT
GLOVE BIO SURGEON STRL SZ7.5 (GLOVE) ×2 IMPLANT
GLOVE BIOGEL M STRL SZ7.5 (GLOVE) IMPLANT
GLOVE INDICATOR 8.0 STRL GRN (GLOVE) ×2 IMPLANT
GOWN STRL REUS W/TWL LRG LVL3 (GOWN DISPOSABLE) ×2 IMPLANT
GOWN STRL REUS W/TWL XL LVL3 (GOWN DISPOSABLE) ×4 IMPLANT
IV LACTATED RINGERS 1000ML (IV SOLUTION) ×2 IMPLANT
KIT BASIN OR (CUSTOM PROCEDURE TRAY) ×2 IMPLANT
LIQUID BAND (GAUZE/BANDAGES/DRESSINGS) IMPLANT
PENCIL BUTTON HOLSTER BLD 10FT (ELECTRODE) IMPLANT
POUCH SPECIMEN RETRIEVAL 10MM (ENDOMECHANICALS) ×2 IMPLANT
RELOAD 45 VASCULAR/THIN (ENDOMECHANICALS) ×4 IMPLANT
RELOAD STAPLE 45 2.5 WHT GRN (ENDOMECHANICALS) IMPLANT
RELOAD STAPLE 45 3.5 BLU ETS (ENDOMECHANICALS) IMPLANT
RELOAD STAPLE TA45 3.5 REG BLU (ENDOMECHANICALS) IMPLANT
SET IRRIG TUBING LAPAROSCOPIC (IRRIGATION / IRRIGATOR) ×2 IMPLANT
SHEARS HARMONIC ACE PLUS 36CM (ENDOMECHANICALS) ×2 IMPLANT
SOLUTION ANTI FOG 6CC (MISCELLANEOUS) ×2 IMPLANT
STRIP CLOSURE SKIN 1/2X4 (GAUZE/BANDAGES/DRESSINGS) IMPLANT
SUT MNCRL AB 4-0 PS2 18 (SUTURE) ×2 IMPLANT
TOWEL OR 17X26 10 PK STRL BLUE (TOWEL DISPOSABLE) ×2 IMPLANT
TRAY FOLEY W/METER SILVER 14FR (SET/KITS/TRAYS/PACK) ×2 IMPLANT
TRAY LAPAROSCOPIC (CUSTOM PROCEDURE TRAY) ×2 IMPLANT
TROCAR BLADELESS OPT 5 75 (ENDOMECHANICALS) ×4 IMPLANT
TROCAR XCEL 12X100 BLDLESS (ENDOMECHANICALS) IMPLANT
TROCAR XCEL BLUNT TIP 100MML (ENDOMECHANICALS) ×2 IMPLANT
TUBING INSUFFLATION 10FT LAP (TUBING) ×2 IMPLANT

## 2015-02-23 NOTE — Anesthesia Postprocedure Evaluation (Addendum)
  Anesthesia Post-op Note  Patient: Lisa [name]  Procedure(s) Performed: Procedure(s): APPENDECTOMY LAPAROSCOPIC (N/A)  Patient Location: PACU  Anesthesia Type:General  Level of Consciousness: awake  Airway and Oxygen Therapy: Patient Spontanous Breathing and Patient connected to nasal cannula oxygen  Post-op Pain: Moderate  Post-op Assessment: Post-op Vital signs reviewed, Patient's Cardiovascular Status Stable, Respiratory Function Stable, Patent Airway, No signs of Nausea or vomiting and Pain level controlled              Post-op Vital Signs: Reviewed and stable  Last Vitals:  Filed Vitals:   02/23/15 2345  BP: 104/84  Pulse: 62  Temp:   Resp: 17    Complications: No apparent anesthesia complications

## 2015-02-23 NOTE — Anesthesia Preprocedure Evaluation (Signed)
Anesthesia Evaluation  Patient identified by MRN, date of birth, ID band Patient awake    Reviewed: Allergy & Precautions, NPO status , Patient's Chart, lab work & pertinent test results  Airway Mallampati: II  TM Distance: >3 FB Neck ROM: Full    Dental no notable dental hx.    Pulmonary former smoker,  breath sounds clear to auscultation  Pulmonary exam normal       Cardiovascular Exercise Tolerance: Good hypertension, Pt. on medications Normal cardiovascular exam+ Valvular Problems/Murmurs Rhythm:Regular Rate:Normal     Neuro/Psych PSYCHIATRIC DISORDERS Anxiety Depression negative neurological ROS     GI/Hepatic Neg liver ROS, GERD-  Medicated,  Endo/Other  negative endocrine ROS  Renal/GU negative Renal ROS  negative genitourinary   Musculoskeletal negative musculoskeletal ROS (+)   Abdominal   Peds negative pediatric ROS (+)  Hematology  (+) anemia ,   Anesthesia Other Findings   Reproductive/Obstetrics negative OB ROS                             Anesthesia Physical Anesthesia Plan  ASA: II and emergent  Anesthesia Plan: General   Post-op Pain Management:    Induction: Intravenous  Airway Management Planned: Oral ETT  Additional Equipment:   Intra-op Plan:   Post-operative Plan: Extubation in OR  Informed Consent: I have reviewed the patients History and Physical, chart, labs and discussed the procedure including the risks, benefits and alternatives for the proposed anesthesia with the patient or authorized representative who has indicated his/her understanding and acceptance.   Dental advisory given  Plan Discussed with: CRNA  Anesthesia Plan Comments:         Anesthesia Quick Evaluation

## 2015-02-23 NOTE — H&P (Signed)
Lisa [name] is an 44 y.o. female.   Chief Complaint: abd pain HPI: 45 yo female developed headache, fatigue, poor appetite on Monday followed sharp colicky RLQ pain yesterday. Also developed nausea. Pain was persistent and not improving so went to urgent care. Labs normal. But was very tender on exam so CT done. Pt reports remote h/o abd pain with some radiation to her lower abd for ulcer disease. No NSAIDs. This pain doesn't radiate. No sexually active, no dysuria, no discharge. No hematuria. No prior abd surgery.   Past Medical History  Diagnosis Date  . Hyperlipidemia   . Hypertension   . Ulcer     x2  . Heart murmur   . Anxiety 20 years ago  . Depression 20 years ago  . GERD (gastroesophageal reflux disease)   . Anemia     hx of    Past Surgical History  Procedure Laterality Date  . No past surgeries    . Esophagogastroduodenoscopy endoscopy      at least 4  . Total hip arthroplasty Left 02/06/2013    Procedure: LEFT TOTAL HIP ARTHROPLASTY ANTERIOR APPROACH;  Surgeon: Mcarthur Rossetti, MD;  Location: [facility];  Service: Orthopedics;  Laterality: Left;    Family History  Problem Relation Age of Onset  . Adopted: Yes   Social History:  reports that she quit smoking about 18 months ago. Her smoking use included Cigarettes. She has a 5 pack-year smoking history. She has never used smokeless tobacco. She reports that she drinks alcohol. She reports that she does not use illicit drugs.  Allergies:  Allergies  Allergen Reactions  . Nsaids Other (See Comments)    ulcers     (Not in a hospital admission)  Results for orders placed or performed during the hospital encounter of 02/23/15 (from the past 48 hour(s))  CBC     Status: None   Collection Time: 02/23/15  7:26 PM  Result Value Ref Range   WBC 9.9 4.0 - 10.5 K/uL   RBC 4.34 3.87 - 5.11 MIL/uL   Hemoglobin 12.3 12.0 - 15.0 g/dL   HCT 37.7 36.0 - 46.0 %   MCV 86.9 78.0 - 100.0 fL   MCH 28.3 26.0 - 34.0 pg   MCHC 32.6 30.0 - 36.0 g/dL   RDW 12.8 11.5 - 15.5 %   Platelets 197 150 - 400 K/uL  Basic metabolic panel     Status: Abnormal   Collection Time: 02/23/15  7:26 PM  Result Value Ref Range   Sodium 136 135 - 145 mmol/L   Potassium 4.5 3.5 - 5.1 mmol/L   Chloride 106 101 - 111 mmol/L   CO2 23 22 - 32 mmol/L   Glucose, Bld 101 (H) 65 - 99 mg/dL   BUN 10 6 - 20 mg/dL   Creatinine, Ser 0.58 0.44 - 1.00 mg/dL   Calcium 9.5 8.9 - 10.3 mg/dL   GFR calc non Af Amer >60 >60 mL/min   GFR calc Af Amer >60 >60 mL/min    Comment: (NOTE) The eGFR has been calculated using the CKD EPI equation. This calculation has not been validated in all clinical situations. eGFR's persistently <60 mL/min signify possible Chronic Kidney Disease.    Anion gap 7 5 - 15  Type and screen     Status: None   Collection Time: 02/23/15  7:26 PM  Result Value Ref Range   ABO/RH(D) O POS    Antibody Screen NEG    Sample Expiration  02/26/2015    Ct Abdomen Pelvis W Contrast  02/23/2015   CLINICAL DATA:  Right lower quadrant pain, diarrhea  EXAM: CT ABDOMEN AND PELVIS WITH CONTRAST  TECHNIQUE: Multidetector CT imaging of the abdomen and pelvis was performed using the standard protocol following bolus administration of intravenous contrast.  CONTRAST:  100 mL Isovue-300  COMPARISON:  None.  FINDINGS: Lower chest:  Lung bases are clear.  Normal heart size.  Hepatobiliary: Normal liver. Normal gallbladder. Mild prominence of the common bile duct which tapers to a normal caliber at the level of the pancreatic head. Otherwise, no intrahepatic or extrahepatic biliary ductal dilatation.  Pancreas: Normal pancreas.  Spleen: Normal spleen.  Adrenals/Urinary Tract: Normal adrenal glands. Normal kidneys. Normal bladder.  Stomach/Bowel: No bowel wall thickening or dilatation. No pneumatosis, pneumoperitoneum or portal venous gas. Dilated appendix measuring 13 mm without significant periappendiceal inflammatory changes. No periappendiceal  free fluid. No abdominal or pelvic free fluid.  Vascular/Lymphatic: Normal caliber aorta. No abdominal or pelvic lymphadenopathy.  Reproductive: Normal uterus and ovaries.  Other: No fluid collection or hematoma.  Musculoskeletal: No lytic or sclerotic osseous lesion. No acute osseous abnormality. Total left hip arthroplasty. Degenerative disc disease with disc height loss at L5-S1 mild degenerative disc disease at L4-5. Mild broad-based disc bulge at L4-5 and L5-S1.  IMPRESSION: 1. Dilated appendix measuring 13 mm, but without significant surrounding periappendiceal inflammatory changes or free fluid. This may reflect early acute appendicitis, but correlation with physical exam and laboratory values is recommended given the lack of other ancillary CT findings.   Electronically Signed   By: Kathreen Devoid   On: 02/23/2015 15:54    Review of Systems  Constitutional: Positive for malaise/fatigue. Negative for fever, chills and weight loss.  HENT: Negative for nosebleeds.   Eyes: Negative for blurred vision.  Respiratory: Negative for shortness of breath.   Cardiovascular: Negative for chest pain, palpitations, orthopnea and PND.       Denies DOE  Gastrointestinal: Positive for nausea, abdominal pain and diarrhea (baseline). Negative for vomiting, constipation, blood in stool and melena.  Genitourinary: Negative for dysuria, urgency, frequency, hematuria and flank pain.  Musculoskeletal: Negative.  Negative for myalgias.  Skin: Negative for itching and rash.  Neurological: Negative for dizziness, focal weakness, seizures, loss of consciousness and headaches.       Denies TIAs, amaurosis fugax  Endo/Heme/Allergies: Does not bruise/bleed easily.  Psychiatric/Behavioral: The patient is not nervous/anxious.     Blood pressure 101/75, pulse 103, temperature 98.3 F (36.8 C), temperature source Oral, resp. rate 16, SpO2 100 %. Physical Exam  Vitals reviewed. Constitutional: She is oriented to person,  place, and time. She appears well-developed and well-nourished. No distress.  nontoxic  HENT:  Head: Normocephalic and atraumatic.  Right Ear: External ear normal.  Left Ear: External ear normal.  Eyes: Conjunctivae are normal. No scleral icterus.  Neck: Normal range of motion. Neck supple. No tracheal deviation present. No thyromegaly present.  Cardiovascular: Normal rate and normal heart sounds.   Respiratory: Effort normal and breath sounds normal. No stridor. No respiratory distress. She has no wheezes.  GI: Soft. She exhibits no distension. There is tenderness in the right lower quadrant, periumbilical area and suprapubic area. There is guarding. There is no rebound.    RLQ/periumbilical/suprapubic TTP, most TTP in RLQ, mcburney's point. +voluntary guarding. No RT  Musculoskeletal: She exhibits no edema or tenderness.  Lymphadenopathy:    She has no cervical adenopathy.  Neurological: She is alert and oriented to  person, place, and time. She exhibits normal muscle tone.  Skin: Skin is warm and dry. No rash noted. She is not diaphoretic. No erythema. No pallor.  Psychiatric: She has a normal mood and affect. Her behavior is normal. Judgment and thought content normal.     Assessment/Plan RLQ abdominal pain Acute appendicitis  Given her HPI and exam and borderline CT scan I am most concerned for appendicitis. No obvious gynecological source. Urine clean. No sign of enteritis or gastric inflammation  We discussed the etiology and management of acute appendicitis. We discussed operative and nonoperative management.  I recommended operative management along with IV antibiotics.  We discussed laparoscopic appendectomy. We discussed the risk and benefits of surgery including but not limited to bleeding, infection, injury to surrounding structures, need to convert to an open procedure, blood clot formation, post operative abscess or wound infection, staple line complications such as leak  or bleeding, hernia formation, post operative ileus, need for additional procedures, anesthesia complications, and the typical postoperative course. I explained that the patient should expect a good improvement in their symptoms.  We discussed typical postop course and recovery.  Her parents were at bedside.   Leighton Ruff. Redmond Pulling, MD, FACS General, Bariatric, & Minimally Invasive Surgery [facility], [place]   [facility] M 02/23/2015, 8:41 PM

## 2015-02-23 NOTE — Anesthesia Procedure Notes (Signed)
Procedure Name: Intubation Date/Time: 02/23/2015 10:08 PM Performed by: Paris LoreBLANTON, Sanjith Siwek M Pre-anesthesia Checklist: Patient identified, Emergency Drugs available, Suction available, Patient being monitored and Timeout performed Patient Re-evaluated:Patient Re-evaluated prior to inductionOxygen Delivery Method: Circle system utilized Preoxygenation: Pre-oxygenation with 100% oxygen Intubation Type: IV induction Ventilation: Mask ventilation without difficulty Laryngoscope Size: Mac and 4 Grade View: Grade I Tube type: Oral Tube size: 7.5 mm Number of attempts: 1 Airway Equipment and Method: Stylet Placement Confirmation: ETT inserted through vocal cords under direct vision,  positive ETCO2,  CO2 detector and breath sounds checked- equal and bilateral Secured at: 21 cm Tube secured with: Tape Dental Injury: Teeth and Oropharynx as per pre-operative assessment

## 2015-02-23 NOTE — Op Note (Signed)
Lisa [name] [id number] 1971/03/14 02/23/2015  Appendectomy, Lap, Procedure Note  Indications: The patient presented with a history of right-sided abdominal pain. A CT revealed dilated enlarged appendix  Pre-operative Diagnosis: Acute appendicitis without mention of peritonitis  Post-operative Diagnosis: Same  Surgeon: Atilano Ina   Assistants: none  Anesthesia: General endotracheal anesthesia  ASA Class: 2, E  Procedure Details  The patient was seen again in the Holding Room. The risks, benefits, complications, treatment options, and expected outcomes were discussed with the patient and/or family. The possibilities of perforation of viscus, bleeding, recurrent infection, the need for additional procedures, failure to diagnose a condition, and creating a complication requiring transfusion or operation were discussed. There was concurrence with the proposed plan and informed consent was obtained. The site of surgery was properly noted. The patient was taken to Operating Room, identified as Lisa [name] and the procedure verified as Appendectomy. A Time Out was held and the above information confirmed. She had received IV antibiotics.  The patient was placed in the supine position and general anesthesia was induced, along with placement of orogastric tube, SCDs, and a Foley catheter. The abdomen was prepped and draped in a sterile fashion. A 1.5 centimeter infraumbilical incision was made.  The umbilical stalk was elevated, and the midline fascia was incised with a #11 blade.  A Kelly clamp was used to confirm entrance into the peritoneal cavity.  A pursestring suture was passed around the incision with a 0 Vicryl.  A 12mm Hasson was introduced into the abdomen and the tails of the suture were used to hold the Hasson in place.   The pneumoperitoneum was then established to steady pressure of 15 mmHg.  Additional 5 mm cannulas then placed in the left lower quadrant of the abdomen and the  suprapubic region under direct visualization. A careful evaluation of the entire abdomen was carried out. The patient was placed in Trendelenburg and left lateral decubitus position. The small intestines were retracted in the cephalad and left lateral direction away from the pelvis and right lower quadrant. The patient was found to have an inflammed thickened appendix that was extending into the pelvis. There was no evidence of perforation. Her appendix was quite long. She also had an adhesive band from her cecum to RLQ abdominal wall.   The appendix was carefully dissected. The appendix was was skeletonized with the harmonic scalpel.   The appendix was divided at its base using an endo-GIA stapler with a white load. There was about 2mm left where the stapler had not completely disconnected the appendix from the cecum so I used another fire of the white 45mm stapler. No appendiceal stump was left in place. The appendix was removed from the abdomen with an Endocatch bag through the umbilical port.  There was no evidence of bleeding, leakage, or complication after division of the appendix. Irrigation was also performed and irrigate suctioned from the abdomen as well. I decided to take down the adhesive band extending from the anterolateral wall of the cecum to the RLQ peritoneal surface. I did this with the Harmonic close to the abdominal wall peritoneal surface. However when I inspected the colon side I was concerned that the thermal spread had gotten to close to the wall of the cecum. I did not appreciate any defect. But I decided I should I should oversew it or wedge it out with another fire of the stapler. I was able to grasp this area and lift it up. There was ample  redundancy within the cecum whereby taking it would not narrow the cecum. Using a blue 45 mm load, I was come underneath my grasper and fire and transect this small section out- it was around 1 square cm. The staple line was intact and did not cross  the appendiceal staple line. This could have represented a small diverticular tic.   The umbilical port site was closed with the purse string suture. The closure was viewed laparoscopically. There was no residual palpable fascial defect.  The trocar site skin wounds were closed with 4-0 Monocryl. Dermabond was applied to the skin incisions.  Instrument, sponge, and needle counts were correct at the conclusion of the case.   Findings: The appendix was found to be inflamed. There were not signs of necrosis.  There was not perforation. There was not abscess formation.  Estimated Blood Loss:  Minimal         Drains: none         Specimens: none         Complications:  None; patient tolerated the procedure well.         Disposition: PACU - hemodynamically stable.         Condition: stable  Mary SellaEric M. Andrey CampanileWilson, MD, FACS General, Bariatric, & Minimally Invasive Surgery [facility] [place]

## 2015-02-23 NOTE — ED Provider Notes (Signed)
CSN: [id number]     Arrival date & time 02/23/15  1808 History   First MD Initiated Contact with Patient 02/23/15 1854     Chief Complaint  Patient presents with  . Appendicitis      (Consider location/radiation/quality/duration/timing/severity/associated sxs/prior Treatment) HPI Comments: The patient's a 44 year old female, prior left total hip arthroplasty secondary to avascular necrosis, no other significant abdominal surgery in the past.  She presents from her family doctor's office via CT scan showing appendicitis. States that she has had several days of nausea, generally not feeling well but yesterday approximately 30 hours ago developed acute onset of right lower quadrant pain which has been persistent since that time. Her last meal was approximately 2 hours ago when she had french fries after the CAT scan. She denies any dysuria or fevers but has had some diarrhea. She was told by her family doctor that the CT scan confirmed appendicitis and she was supposed to come to the hospital.  The history is provided by the patient.    Past Medical History  Diagnosis Date  . Hyperlipidemia   . Hypertension   . Ulcer     x2  . Heart murmur   . Anxiety 20 years ago  . Depression 20 years ago  . GERD (gastroesophageal reflux disease)   . Anemia     hx of   Past Surgical History  Procedure Laterality Date  . No past surgeries    . Esophagogastroduodenoscopy endoscopy      at least 4  . Total hip arthroplasty Left 02/06/2013    Procedure: LEFT TOTAL HIP ARTHROPLASTY ANTERIOR APPROACH;  Surgeon: Kathryne Hitch, MD;  Location: [facility];  Service: Orthopedics;  Laterality: Left;   Family History  Problem Relation Age of Onset  . Adopted: Yes   History  Substance Use Topics  . Smoking status: Former Smoker -- 0.25 packs/day for 20 years    Types: Cigarettes    Quit date: 08/13/2013  . Smokeless tobacco: Never Used  . Alcohol Use: 0.0 oz/week     Comment: few glasses of wine a  week   OB History    No data available     Review of Systems  All other systems reviewed and are negative.     Allergies  Nsaids  Home Medications   Prior to Admission medications   Medication Sig Start Date End Date Taking? Authorizing Provider  diphenoxylate-atropine (LOMOTIL) 2.5-0.025 MG per tablet TAKE 1 TABLET BY MOUTH 3 TIMES A DAY AS NEEDED FOR DIARRHEA OR LOOSE STOOLS 06/11/14   Gwenlyn Found Copland, MD  lisinopril (PRINIVIL,ZESTRIL) 10 MG tablet Take 1 tablet (10 mg total) by mouth daily. 06/11/14   Pearline Cables, MD  OVER THE COUNTER MEDICATION [employer] Brand Sleep aid as needed    Historical Provider, MD  pantoprazole (PROTONIX) 40 MG tablet Take 40 mg by mouth every morning.    Historical Provider, MD  simvastatin (ZOCOR) 20 MG tablet Take 1 tablet (20 mg total) by mouth every evening. 12/10/14   Pearline Cables, MD  traZODone (DESYREL) 50 MG tablet Take 1 to 1.5 tablets (50- ) at bedtime as needed for sleep 10/04/14   Gwenlyn Found Copland, MD   BP 101/75 mmHg  Pulse 103  Temp(Src) 98.3 F (36.8 C) (Oral)  Resp 16  SpO2 100% Physical Exam  Constitutional: She appears well-developed and well-nourished. No distress.  HENT:  Head: Normocephalic and atraumatic.  Mouth/Throat: Oropharynx is clear and moist. No oropharyngeal exudate.  Eyes: Conjunctivae and EOM are normal. Pupils are equal, round, and reactive to light. Right eye exhibits no discharge. Left eye exhibits no discharge. No scleral icterus.  Neck: Normal range of motion. Neck supple. No JVD present. No thyromegaly present.  Cardiovascular: Normal rate, regular rhythm, normal heart sounds and intact distal pulses.  Exam reveals no gallop and no friction rub.   No murmur heard. Pulmonary/Chest: Effort normal and breath sounds normal. No respiratory distress. She has no wheezes. She has no rales.  Abdominal: Soft. Bowel sounds are normal. She exhibits no distension and no mass. There is tenderness (focal  tenderness at McBurney's point, no guarding, no peritoneal signs).  Musculoskeletal: Normal range of motion. She exhibits no edema or tenderness.  Lymphadenopathy:    She has no cervical adenopathy.  Neurological: She is alert. Coordination normal.  Skin: Skin is warm and dry. No rash noted. No erythema.  Psychiatric: She has a normal mood and affect. Her behavior is normal.  Nursing note and vitals reviewed.   ED Course  Procedures (including critical care time) Labs Review Labs Reviewed  CBC  BASIC METABOLIC PANEL  TYPE AND SCREEN    Imaging Review Ct Abdomen Pelvis W Contrast  02/23/2015   CLINICAL DATA:  Right lower quadrant pain, diarrhea  EXAM: CT ABDOMEN AND PELVIS WITH CONTRAST  TECHNIQUE: Multidetector CT imaging of the abdomen and pelvis was performed using the standard protocol following bolus administration of intravenous contrast.  CONTRAST:  100 mL Isovue-300  COMPARISON:  None.  FINDINGS: Lower chest:  Lung bases are clear.  Normal heart size.  Hepatobiliary: Normal liver. Normal gallbladder. Mild prominence of the common bile duct which tapers to a normal caliber at the level of the pancreatic head. Otherwise, no intrahepatic or extrahepatic biliary ductal dilatation.  Pancreas: Normal pancreas.  Spleen: Normal spleen.  Adrenals/Urinary Tract: Normal adrenal glands. Normal kidneys. Normal bladder.  Stomach/Bowel: No bowel wall thickening or dilatation. No pneumatosis, pneumoperitoneum or portal venous gas. Dilated appendix measuring 13 mm without significant periappendiceal inflammatory changes. No periappendiceal free fluid. No abdominal or pelvic free fluid.  Vascular/Lymphatic: Normal caliber aorta. No abdominal or pelvic lymphadenopathy.  Reproductive: Normal uterus and ovaries.  Other: No fluid collection or hematoma.  Musculoskeletal: No lytic or sclerotic osseous lesion. No acute osseous abnormality. Total left hip arthroplasty. Degenerative disc disease with disc height  loss at L5-S1 mild degenerative disc disease at L4-5. Mild broad-based disc bulge at L4-5 and L5-S1.  IMPRESSION: 1. Dilated appendix measuring 13 mm, but without significant surrounding periappendiceal inflammatory changes or free fluid. This may reflect early acute appendicitis, but correlation with physical exam and laboratory values is recommended given the lack of other ancillary CT findings.   Electronically Signed   By: Elige KoHetal  Patel   On: 02/23/2015 15:54     EKG Interpretation None      MDM   Final diagnoses:  Acute appendicitis, unspecified acute appendicitis type    Vital signs are unremarkable, we'll add labs, CT scan reviewed and shows acute appendicitis most likely though no periappendiceal inflammation or fluid there is a dilated appendix. Will discussed with general surgery.  D/w Dr. Andrey CampanileWilson of Gen Surg - will come to see in ED   Meds given in ED:  Medications  fentaNYL (SUBLIMAZE) injection 50 mcg (not administered)  ciprofloxacin (CIPRO) IVPB 400 mg (not administered)    New Prescriptions   No medications on file      Eber HongBrian Jospeh Mangel, MD 02/23/15 1913

## 2015-02-23 NOTE — ED Notes (Signed)
MD at bedside. 

## 2015-02-23 NOTE — ED Notes (Signed)
Pt states that she had a CT this afternoon and has a confirmed appy.

## 2015-02-23 NOTE — Transfer of Care (Signed)
Immediate Anesthesia Transfer of Care Note  Patient: Lisa [name]  Procedure(s) Performed: Procedure(s): APPENDECTOMY LAPAROSCOPIC (N/A)  Patient Location: PACU  Anesthesia Type:General  Level of Consciousness:  sedated, patient cooperative and responds to stimulation  Airway & Oxygen Therapy:Patient Spontanous Breathing and Patient connected to face mask oxgen  Post-op Assessment:  Report given to PACU RN and Post -op Vital signs reviewed and stable  Post vital signs:  Reviewed and stable  Last Vitals:  Filed Vitals:   02/23/15 2043  BP: 106/79  Pulse: 82  Temp:   Resp: 16    Complications: No apparent anesthesia complications

## 2015-02-23 NOTE — Progress Notes (Signed)
[facility] [address], [place] [address] [phone]  Date:  02/23/2015   Name:  Lisa [name]   DOB:  10/18/1970   MRN:  [id number]  PCP:  Lisa AmsterdamOPLAND,Lisa Manni, MD    Chief Complaint: Headache; Nausea; and Fatigue   History of Present Illness:  Lisa [name] is a 44 y.o. very pleasant female patient who presents with the following:  Here today with illness.  She has noted illness since Sunday night- today is Wednesday.  Sunday she noted a headache,non-specific GI distress (constiaption and diarrhea both).  She also had some dry heaves.  She is able to take tylenol but this is really all she has used for pain  Monday she felt dizzy and tired.  She stayed in bed all day.  She did not eat at all Monday. Yesterday she ate a little bit of dry bread and broth.  She tried to go to work yesterday but felt too bad. She notes that when she was driving a bump in the road made her feel really nauseated.   Yesterday she noted onset of some sharp pains in her RLQ.   She still has her appendix. She has a history of ulcer in the past.  No   She is taking tylenol regularly.  She has not noted a fever She is dry heaving because there is nothing in her stomach to vomit up.   She did have one bout of diarrhea yesterday after eating- otherwise not having stools now  She is on depo-provera (has been for years) and does not get menses.  Asked about possible STI- she states that she has not had intercourse in 6 or 7 years so she thinks this is highly unlikely    BP Readings from Last 3 Encounters:  02/23/15 100/60  09/23/14 118/78  09/12/14 110/82    Patient Active Problem List   Diagnosis Date Noted  . History of abnormal cervical Pap smear 09/21/2013  . Leukocytopenia, unspecified 03/02/2013  . Avascular necrosis of bone of left hip 02/06/2013  . Hyperlipidemia 10/13/2012  . HTN (hypertension) 10/13/2012    Past Medical History  Diagnosis Date  . Hyperlipidemia   .  Hypertension   . Ulcer     x2  . Heart murmur   . Anxiety 20 years ago  . Depression 20 years ago  . GERD (gastroesophageal reflux disease)   . Anemia     hx of    Past Surgical History  Procedure Laterality Date  . No past surgeries    . Esophagogastroduodenoscopy endoscopy      at least 4  . Total hip arthroplasty Left 02/06/2013    Procedure: LEFT TOTAL HIP ARTHROPLASTY ANTERIOR APPROACH;  Surgeon: Kathryne Hitchhristopher Y Blackman, MD;  Location: [facility];  Service: Orthopedics;  Laterality: Left;    History  Substance Use Topics  . Smoking status: Former Smoker -- 0.25 packs/day for 20 years    Types: Cigarettes    Quit date: 08/13/2013  . Smokeless tobacco: Never Used  . Alcohol Use: 0.0 oz/week     Comment: few glasses of wine a week    Family History  Problem Relation Age of Onset  . Adopted: Yes    Allergies  Allergen Reactions  . Nsaids Other (See Comments)    ulcers    Medication list has been reviewed and updated.  Current Outpatient Prescriptions on File Prior to Visit  Medication Sig Dispense Refill  . diphenoxylate-atropine (LOMOTIL) 2.5-0.025  MG per tablet TAKE 1 TABLET BY MOUTH 3 TIMES A DAY AS NEEDED FOR DIARRHEA OR LOOSE STOOLS 60 tablet 3  . lisinopril (PRINIVIL,ZESTRIL) 10 MG tablet Take 1 tablet (10 mg total) by mouth daily. 90 tablet 3  . OVER THE COUNTER MEDICATION [employer] Brand Sleep aid as needed    . pantoprazole (PROTONIX) 40 MG tablet Take 40 mg by mouth every morning.    . simvastatin (ZOCOR) 20 MG tablet Take 1 tablet (20 mg total) by mouth every evening. 90 tablet 2  . traZODone (DESYREL) 50 MG tablet Take 1 to 1.5 tablets (50- ) at bedtime as needed for sleep 135 tablet 2   No current facility-administered medications on file prior to visit.    Review of Systems:  As per HPI- otherwise negative.   Physical Examination: Filed Vitals:   02/23/15 0822  BP: 100/60  Pulse: 98  Temp: 98.4 F (36.9 C)  Resp: 16   Filed Vitals:    02/23/15 0822  Height: 5' 7.5" (1.715 m)  Weight: 165 lb (74.844 kg)   Body mass index is 25.45 kg/(m^2). Ideal Body Weight: Weight in (lb) to have BMI = 25: 161.7  GEN: WDWN, NAD, Non-toxic, A & O x 3, looks well HEENT: Atraumatic, Normocephalic. Neck supple. No masses, No LAD. Ears and Nose: No external deformity. CV: RRR, No M/G/R. No JVD. No thrill. No extra heart sounds. PULM: CTA B, no wheezes, crackles, rhonchi. No retractions. No resp. distress. No accessory muscle use. ABD: S, ND, +BS. No rebound. No HSM.  She has RLQ tenderness and mild guarding.   EXTR: No c/c/e NEURO Normal gait.  PSYCH: Normally interactive. Conversant. Not depressed or anxious appearing.  Calm demeanor.   Results for orders placed or performed in visit on 02/23/15  POCT CBC  Result Value Ref Range   WBC 6.9 4.6 - 10.2 K/uL   Lymph, poc 2.3 0.6 - 3.4   POC LYMPH PERCENT 33.8 10 - 50 %L   MID (cbc) 0.4 0 - 0.9   POC MID % 5.3 0 - 12 %M   POC Granulocyte 4.2 2 - 6.9   Granulocyte percent 60.9 37 - 80 %G   RBC 4.57 4.04 - 5.48 M/uL   Hemoglobin 12.6 12.2 - 16.2 g/dL   HCT, POC 95.6 21.3 - 47.9 %   MCV 85.2 80 - 97 fL   MCH, POC 27.6 27 - 31.2 pg   MCHC 32.4 31.8 - 35.4 g/dL   RDW, POC 08.6 %   Platelet Count, POC 209.0 142 - 424 K/uL   MPV 7.9 0 - 99.8 fL  POCT UA - Microscopic Only  Result Value Ref Range   WBC, Ur, HPF, POC Negative    RBC, urine, microscopic Negative    Bacteria, U Microscopic Negative    Mucus, UA Negative    Epithelial cells, urine per micros Negative    Crystals, Ur, HPF, POC Negative    Casts, Ur, LPF, POC Negative    Yeast, UA Negative   POCT urinalysis dipstick  Result Value Ref Range   Color, UA Light Amber    Clarity, UA Clear    Glucose, UA Negative    Bilirubin, UA Negative    Ketones, UA Negative    Spec Grav, UA 1.015    Blood, UA Negative    pH, UA 6.5    Protein, UA Negative    Urobilinogen, UA 0.2    Nitrite, UA Negative    Leukocytes,  UA Negative  Negative  POCT urine pregnancy  Result Value Ref Range   Preg Test, Ur Negative Negative    Assessment and Plan: RLQ abdominal pain - Plan: POCT CBC, POCT UA - Microscopic Only, POCT urinalysis dipstick, POCT urine pregnancy, CT Abdomen Pelvis W Contrast  Sent for a CT abd/ pelvis due to concern of appendicitis. Received CT results at 4:40 pm as below:  CT ABDOMEN AND PELVIS WITH CONTRAST  TECHNIQUE: Multidetector CT imaging of the abdomen and pelvis was performed using the standard protocol following bolus administration of intravenous contrast.  CONTRAST: 100 mL Isovue-300  COMPARISON: None.  FINDINGS: Lower chest: Lung bases are clear. Normal heart size.  Hepatobiliary: Normal liver. Normal gallbladder. Mild prominence of the common bile duct which tapers to a normal caliber at the level of the pancreatic head. Otherwise, no intrahepatic or extrahepatic biliary ductal dilatation. Pancreas: Normal pancreas.  Spleen: Normal spleen.  Adrenals/Urinary Tract: Normal adrenal glands. Normal kidneys. Normal bladder. Stomach/Bowel: No bowel wall thickening or dilatation. No pneumatosis, pneumoperitoneum or portal venous gas. Dilated appendix measuring 13 mm without significant periappendiceal inflammatory changes. No periappendiceal free fluid. No abdominal or pelvic free fluid. Vascular/Lymphatic: Normal caliber aorta. No abdominal or pelvic lymphadenopathy. Reproductive: Normal uterus and ovaries. Other: No fluid collection or hematoma.  Musculoskeletal: No lytic or sclerotic osseous lesion. No acute osseous abnormality. Total left hip arthroplasty. Degenerative disc disease with disc height loss at L5-S1 mild degenerative disc disease at L4-5. Mild broad-based disc bulge at L4-5 and L5-S1.  IMPRESSION: 1. Dilated appendix measuring 13 mm, but without significant surrounding periappendiceal inflammatory changes or free fluid. This may reflect early acute  appendicitis, but correlation with physical exam and laboratory values is recommended given the lack of other ancillary CT findings.  Called Dr. Andrey Campanile who is on call for general surgery.  It appears that she has a subacute appendicitis.  She will proceed to the [facility] for further evaluation and likely appendectomy  Called Franchon and let her know - she will head to [facility] asap Called and alerted charge nurse as well  Signed Lisa Amsterdam, MD

## 2015-02-23 NOTE — Patient Instructions (Signed)
I will talk to you after your CT is done today and we will plan our next step.

## 2015-02-24 ENCOUNTER — Encounter (HOSPITAL_COMMUNITY): Payer: Self-pay | Admitting: General Surgery

## 2015-02-24 DIAGNOSIS — K3580 Unspecified acute appendicitis: Secondary | ICD-10-CM | POA: Diagnosis present

## 2015-02-24 LAB — BASIC METABOLIC PANEL
ANION GAP: 7 (ref 5–15)
BUN: 8 mg/dL (ref 6–20)
CALCIUM: 9.2 mg/dL (ref 8.9–10.3)
CO2: 22 mmol/L (ref 22–32)
Chloride: 105 mmol/L (ref 101–111)
Creatinine, Ser: 0.56 mg/dL (ref 0.44–1.00)
GFR calc Af Amer: >60 mL/min (ref >60)
Glucose, Bld: 189 mg/dL — ABNORMAL HIGH (ref 65–99)
Potassium: 4.9 mmol/L (ref 3.5–5.1)
SODIUM: 134 mmol/L — AB (ref 135–145)

## 2015-02-24 LAB — CBC
HEMATOCRIT: 37.9 % (ref 36.0–46.0)
Hemoglobin: 11.9 g/dL — ABNORMAL LOW (ref 12.0–15.0)
MCH: 27.4 pg (ref 26.0–34.0)
MCHC: 31.4 g/dL (ref 30.0–36.0)
MCV: 87.3 fL (ref 78.0–100.0)
Platelets: 204 10*3/uL (ref 150–400)
RBC: 4.34 MIL/uL (ref 3.87–5.11)
RDW: 12.7 % (ref 11.5–15.5)
WBC: 10.4 10*3/uL (ref 4.0–10.5)

## 2015-02-24 MED ORDER — ONDANSETRON HCL 4 MG/2ML IJ SOLN: 4.0000 mg | Freq: Four times a day (QID) | INTRAMUSCULAR | Status: DC | PRN

## 2015-02-24 MED ORDER — KCL IN DEXTROSE-NACL 20-5-0.45 MEQ/L-%-% IV SOLN
INTRAVENOUS | Status: DC
  Administered 2015-02-24: 125 mL/h via INTRAVENOUS
  Administered 2015-02-24: 13:00:00 via INTRAVENOUS
  Filled 2015-02-24 (×2): qty 1000

## 2015-02-24 MED ORDER — OXYCODONE HCL 5 MG PO TABS: 5.0000 mg | ORAL_TABLET | ORAL | Status: DC | PRN

## 2015-02-24 MED ORDER — LISINOPRIL 10 MG PO TABS: ORAL_TABLET | ORAL | Status: DC

## 2015-02-24 MED ORDER — PROMETHAZINE HCL 25 MG/ML IJ SOLN: 12.5000 mg | Freq: Four times a day (QID) | INTRAMUSCULAR | Status: DC | PRN

## 2015-02-24 MED ORDER — OXYCODONE HCL 5 MG PO TABS
5.0000 mg | ORAL_TABLET | ORAL | Status: DC | PRN
  Administered 2015-02-24: 5 mg via ORAL
  Administered 2015-02-24: 10 mg via ORAL
  Filled 2015-02-24: qty 1
  Filled 2015-02-24: qty 2

## 2015-02-24 MED ORDER — ACETAMINOPHEN 500 MG PO TABS
1000.0000 mg | ORAL_TABLET | Freq: Four times a day (QID) | ORAL | Status: DC
Start: 2015-02-24 — End: 2015-02-24
  Administered 2015-02-24 (×3): 1000 mg via ORAL
  Filled 2015-02-24 (×5): qty 2

## 2015-02-24 MED ORDER — MORPHINE SULFATE 2 MG/ML IJ SOLN
1.0000 mg | INTRAMUSCULAR | Status: DC | PRN
  Administered 2015-02-24 (×3): 2 mg via INTRAVENOUS
  Filled 2015-02-24 (×3): qty 1

## 2015-02-24 MED ORDER — PANTOPRAZOLE SODIUM 40 MG IV SOLR
40.0000 mg | Freq: Every day | INTRAVENOUS | Status: DC
  Administered 2015-02-24: 40 mg via INTRAVENOUS
  Filled 2015-02-24 (×2): qty 40

## 2015-02-24 MED ORDER — ONDANSETRON HCL 4 MG PO TABS: 4.0000 mg | ORAL_TABLET | Freq: Four times a day (QID) | ORAL | Status: DC | PRN

## 2015-02-24 MED ORDER — HEPARIN SODIUM (PORCINE) 5000 UNIT/ML IJ SOLN
5000.0000 [IU] | Freq: Three times a day (TID) | INTRAMUSCULAR | Status: DC
  Administered 2015-02-24: 5000 [IU] via SUBCUTANEOUS
  Filled 2015-02-24 (×3): qty 1

## 2015-02-24 MED ORDER — OXYCODONE HCL 5 MG PO TABS: 5.0000 mg | ORAL_TABLET | Freq: Four times a day (QID) | ORAL | Status: DC | PRN

## 2015-02-24 MED ORDER — LISINOPRIL 10 MG PO TABS: 10.0000 mg | ORAL_TABLET | Freq: Every day | ORAL | Status: DC

## 2015-02-24 MED ORDER — LISINOPRIL 10 MG PO TABS
10.0000 mg | ORAL_TABLET | Freq: Every day | ORAL | Status: DC
  Filled 2015-02-24: qty 1

## 2015-02-24 NOTE — Discharge Summary (Signed)
Physician Discharge Summary  Patient ID: Lisa [name] MRN: [id number] DOB/AGE: 04/25/1971 44 y.o.  Admit date: 02/23/2015 Discharge date: 02/24/2015  Admitting Diagnosis: Acute appendicitis  Discharge Diagnosis Patient Active Problem List   Diagnosis Date Noted  . Acute appendicitis 02/24/2015  . History of abnormal cervical Pap smear 09/21/2013  . Leukocytopenia, unspecified 03/02/2013  . Avascular necrosis of bone of left hip 02/06/2013  . Hyperlipidemia 10/13/2012  . HTN (hypertension) 10/13/2012    Consultants none  Imaging: Ct Abdomen Pelvis W Contrast  02/23/2015   CLINICAL DATA:  Right lower quadrant pain, diarrhea  EXAM: CT ABDOMEN AND PELVIS WITH CONTRAST  TECHNIQUE: Multidetector CT imaging of the abdomen and pelvis was performed using the standard protocol following bolus administration of intravenous contrast.  CONTRAST:  100 mL Isovue-300  COMPARISON:  None.  FINDINGS: Lower chest:  Lung bases are clear.  Normal heart size.  Hepatobiliary: Normal liver. Normal gallbladder. Mild prominence of the common bile duct which tapers to a normal caliber at the level of the pancreatic head. Otherwise, no intrahepatic or extrahepatic biliary ductal dilatation.  Pancreas: Normal pancreas.  Spleen: Normal spleen.  Adrenals/Urinary Tract: Normal adrenal glands. Normal kidneys. Normal bladder.  Stomach/Bowel: No bowel wall thickening or dilatation. No pneumatosis, pneumoperitoneum or portal venous gas. Dilated appendix measuring 13 mm without significant periappendiceal inflammatory changes. No periappendiceal free fluid. No abdominal or pelvic free fluid.  Vascular/Lymphatic: Normal caliber aorta. No abdominal or pelvic lymphadenopathy.  Reproductive: Normal uterus and ovaries.  Other: No fluid collection or hematoma.  Musculoskeletal: No lytic or sclerotic osseous lesion. No acute osseous abnormality. Total left hip arthroplasty. Degenerative disc disease with disc height loss at L5-S1 mild  degenerative disc disease at L4-5. Mild broad-based disc bulge at L4-5 and L5-S1.  IMPRESSION: 1. Dilated appendix measuring 13 mm, but without significant surrounding periappendiceal inflammatory changes or free fluid. This may reflect early acute appendicitis, but correlation with physical exam and laboratory values is recommended given the lack of other ancillary CT findings.   Electronically Signed   By: Elige Ko   On: 02/23/2015 15:54    Procedures Laparoscopic appendectomy Dr. Leonides Grills Course:  Lisa [name] is a 44 year old female who presented to [facility] with abdominal pain.  Workup showed acute appendicitis.  Patient was admitted and underwent procedure listed above.  Tolerated procedure well and was transferred to the floor.  Diet was advanced as tolerated.  On POD#1, the patient was voiding well, tolerating diet, ambulating well, pain well controlled, vital signs stable, incisions c/d/i and felt stable for discharge home.  Patient will follow up in our office in 3 weeks and knows to call with questions or concerns.     Medication List    TAKE these medications        acetaminophen 500 MG tablet  Commonly known as:  TYLENOL  Take 1,000 mg by mouth every 6 (six) hours as needed for headache.     diphenoxylate-atropine 2.5-0.025 MG per tablet  Commonly known as:  LOMOTIL  TAKE 1 TABLET BY MOUTH 3 TIMES A DAY AS NEEDED FOR DIARRHEA OR LOOSE STOOLS     lisinopril 10 MG tablet  Commonly known as:  PRINIVIL,ZESTRIL  Hold on this medicine until you see your Primary care physician.     OVER THE COUNTER MEDICATION  Take 1 tablet by mouth at bedtime as needed (sleep). [employer] Brand Sleep aid as needed     oxyCODONE 5 MG immediate release tablet  Commonly known as:  Oxy IR/ROXICODONE  Take 1-2 tablets (5-10 mg total) by mouth every 6 (six) hours as needed for moderate pain.     pantoprazole 40 MG tablet  Commonly known as:  PROTONIX  Take 40 mg by mouth every morning.      simvastatin 20 MG tablet  Commonly known as:  ZOCOR  Take 1 tablet (20 mg total) by mouth every evening.     traZODone 50 MG tablet  Commonly known as:  DESYREL  Take 1 to 1.5 tablets (50- 75mg ) at bedtime as needed for sleep             Follow-up Information    Follow up with [facility] [place] On 03/22/2015.   Why:  Your appointment is at 1:45, be at the office 30 minutes early for check in.   Contact information:   [address] [place] [id number] [phone]      Signed: Ashok Norrismina Janitza Revuelta, [facility] Center[facility]BC [facility] [phone]  02/24/2015, 4:19 PM

## 2015-02-24 NOTE — Discharge Instructions (Signed)
Laparoscopic Appendectomy °Care After °Refer to this sheet in the next few weeks. These instructions provide you with information on caring for yourself after your procedure. Your caregiver may also give you more specific instructions. Your treatment has been planned according to current medical practices, but problems sometimes occur. Call your caregiver if you have any problems or questions after your procedure. °HOME CARE INSTRUCTIONS °· Do not drive while taking narcotic pain medicines. °· Use stool softener if you become constipated from your pain medicines. °· Change your bandages (dressings) as directed. °· Keep your wounds clean and dry. You may wash the wounds gently with soap and water. Gently pat the wounds dry with a clean towel. °· Do not take baths, swim, or use hot tubs for 10 days, or as instructed by your caregiver. °· Only take over-the-counter or prescription medicines for pain, discomfort, or fever as directed by your caregiver. °· You may continue your normal diet as directed. °· Do not lift more than 10 pounds (4.5 kg) or play contact sports for 3 weeks, or as directed. °· Slowly increase your activity after surgery. °· Take deep breaths to avoid getting a lung infection (pneumonia). °SEEK MEDICAL CARE IF: °· You have redness, swelling, or increasing pain in your wounds. °· You have pus coming from your wounds. °· You have drainage from a wound that lasts longer than 1 day. °· You notice a bad smell coming from the wounds or dressing. °· Your wound edges break open after stitches (sutures) have been removed. °· You notice increasing pain in the shoulders (shoulder strap areas) or near your shoulder blades. °· You develop dizzy episodes or fainting while standing. °· You develop shortness of breath. °· You develop persistent nausea or vomiting. °· You cannot control your bowel functions or lose your appetite. °· You develop diarrhea. °SEEK IMMEDIATE MEDICAL CARE IF:  °· You have a fever. °· You  develop a rash. °· You have difficulty breathing or sharp pains in your chest. °· You develop any reaction or side effects to medicines given. °MAKE SURE YOU: °· Understand these instructions. °· Will watch your condition. °· Will get help right away if you are not doing well or get worse. °Document Released: 07/30/2005 Document Revised: 10/22/2011 Document Reviewed: 02/06/2011 °[employer]® Patient Information ©2015 [employer]. This information is not intended to replace advice given to you by your health care provider. Make sure you discuss any questions you have with your health care provider. °[facility] ______[facility] °LAPAROSCOPIC SURGERY: POST OP INSTRUCTIONS °Always review your discharge instruction sheet given to you by the facility where your surgery was performed. °IF YOU HAVE DISABILITY OR FAMILY LEAVE FORMS, YOU MUST BRING THEM TO THE OFFICE FOR PROCESSING.   °DO NOT GIVE THEM TO YOUR DOCTOR. ° °1. A prescription for pain medication may be given to you upon discharge.  Take your pain medication as prescribed, if needed.  If narcotic pain medicine is not needed, then you may take acetaminophen (Tylenol) or ibuprofen (Advil) as needed. °2. Take your usually prescribed medications unless otherwise directed. °3. If you need a refill on your pain medication, please contact your pharmacy.  They will contact our office to request authorization. Prescriptions will not be filled after 5pm or on week-ends. °4. You should follow a light diet the first few days after arrival home, such as soup and crackers, etc.  Be sure to include lots of fluids daily. °5. Most patients will experience some swelling and bruising   in the area of the incisions.  Ice packs will help.  Swelling and bruising can take several days to resolve.  °6. It is common to experience some constipation if taking pain medication after surgery.  Increasing fluid intake and taking a stool softener (such as Colace) will usually help or  prevent this problem from occurring.  A mild laxative (Milk of Magnesia or Miralax) should be taken according to package instructions if there are no bowel movements after 48 hours. °7. Unless discharge instructions indicate otherwise, you may remove your bandages 24-48 hours after surgery, and you may shower at that time.  You may have steri-strips (small skin tapes) in place directly over the incision.  These strips should be left on the skin for 7-10 days.  If your surgeon used skin glue on the incision, you may shower in 24 hours.  The glue will flake off over the next 2-3 weeks.  Any sutures or staples will be removed at the office during your follow-up visit. °8. ACTIVITIES:  You may resume regular (light) daily activities beginning the next day--such as daily self-care, walking, climbing stairs--gradually increasing activities as tolerated.  You may have sexual intercourse when it is comfortable.  Refrain from any heavy lifting or straining until approved by your doctor. °a. You may drive when you are no longer taking prescription pain medication, you can comfortably wear a seatbelt, and you can safely maneuver your car and apply brakes. °b. RETURN TO WORK:  __________________________________________________________ °9. You should see your doctor in the office for a follow-up appointment approximately 2-3 weeks after your surgery.  Make sure that you call for this appointment within a day or two after you arrive home to insure a convenient appointment time. °10. OTHER INSTRUCTIONS: __________________________________________________________________________________________________________________________ __________________________________________________________________________________________________________________________ °WHEN TO CALL YOUR DOCTOR: °1. Fever over 101.0 °2. Inability to urinate °3. Continued bleeding from incision. °4. Increased pain, redness, or drainage from the incision. °5. Increasing  abdominal pain ° °The clinic staff is available to answer your questions during regular business hours.  Please don’t hesitate to call and ask to speak to one of the nurses for clinical concerns.  If you have a medical emergency, go to the nearest emergency room or call 911.  A surgeon from [facility] is always on call at the hospital. °[address], [address], [place], [place]  [address] ? [address] [address], [place], [place]   [address] °[phone] ? [phone] ? FAX [phone] °Web site: [email or web address] ° °

## 2015-02-24 NOTE — Anesthesia Postprocedure Evaluation (Signed)
  Anesthesia Post-op Note  Patient: Lisa [name]  Procedure(s) Performed: Procedure(s) (LRB): APPENDECTOMY LAPAROSCOPIC (N/A)  Patient Location: PACU  Anesthesia Type: general  Level of Consciousness: awake and alert   Airway and Oxygen Therapy: Patient Spontanous Breathing  Post-op Pain: mild  Post-op Assessment: Post-op Vital signs reviewed, Patient's Cardiovascular Status Stable, Respiratory Function Stable, Patent Airway and No signs of Nausea or vomiting  Last Vitals:  Filed Vitals:   02/24/15 0507  BP: 100/58  Pulse: 107  Temp: 36.9 C  Resp: 18    Post-op Vital Signs: stable   Complications: No apparent anesthesia complications

## 2015-02-24 NOTE — Progress Notes (Signed)
Patient discharged via wheelchair to private vehicle home. Patient expresses understanding of all discharge teaching. PIV removed.

## 2015-02-24 NOTE — Progress Notes (Signed)
1 Day Post-Op  Subjective: Overall she looks quite good, still sore, but better.  Has not eaten for 3 days, no issues with ice and water.  Voiding more than currently recorded.  Objective: Vital signs in last 24 hours: Temp:  [96.3 F (35.7 C)-98.7 F (37.1 C)] 98.4 F (36.9 C) (07/14 0507) Pulse Rate:  [62-110] 107 (07/14 0507) Resp:  [13-18] 18 (07/14 0507) BP: (99-117)/(57-84) 100/58 mmHg (07/14 0507) SpO2:  [94 %-100 %] 99 % (07/14 0507) FiO2 (%):  [2 %-8 %] 2 % (07/14 0000) Weight:  [74.844 kg (165 lb)-82 kg (180 lb 12.4 oz)] 82 kg (180 lb 12.4 oz) (07/14 0600) Last BM Date: 02/22/15 NPO  Urine 400 Afebrile, tachycardic, SBP 99-100 range Labs OK this AM Intake/Output from previous day: 07/13 0701 - 07/14 0700 In: 1293.8 [I.V.:1293.8] Out: 400 [Urine:400] Intake/Output this shift:    General appearance: alert, cooperative and no distress Resp: clear to auscultation bilaterally GI: soft, sore, few BS, port sites all look good.  Lab Results:   Recent Labs  02/23/15 1926 02/24/15 0558  WBC 9.9 10.4  HGB 12.3 11.9*  HCT 37.7 37.9  PLT 197 204    BMET  Recent Labs  02/23/15 1926 02/24/15 0558  NA 136 134*  K 4.5 4.9  CL 106 105  CO2 23 22  GLUCOSE 101* 189*  BUN 10 8  CREATININE 0.58 0.56  CALCIUM 9.5 9.2   PT/INR No results for input(s): LABPROT, INR in the last 72 hours.  No results for input(s): AST, ALT, ALKPHOS, BILITOT, PROT, ALBUMIN in the last 168 hours.   Lipase  No results found for: LIPASE   Studies/Results: Ct Abdomen Pelvis W Contrast  02/23/2015   CLINICAL DATA:  Right lower quadrant pain, diarrhea  EXAM: CT ABDOMEN AND PELVIS WITH CONTRAST  TECHNIQUE: Multidetector CT imaging of the abdomen and pelvis was performed using the standard protocol following bolus administration of intravenous contrast.  CONTRAST:  100 mL Isovue-300  COMPARISON:  None.  FINDINGS: Lower chest:  Lung bases are clear.  Normal heart size.  Hepatobiliary: Normal  liver. Normal gallbladder. Mild prominence of the common bile duct which tapers to a normal caliber at the level of the pancreatic head. Otherwise, no intrahepatic or extrahepatic biliary ductal dilatation.  Pancreas: Normal pancreas.  Spleen: Normal spleen.  Adrenals/Urinary Tract: Normal adrenal glands. Normal kidneys. Normal bladder.  Stomach/Bowel: No bowel wall thickening or dilatation. No pneumatosis, pneumoperitoneum or portal venous gas. Dilated appendix measuring 13 mm without significant periappendiceal inflammatory changes. No periappendiceal free fluid. No abdominal or pelvic free fluid.  Vascular/Lymphatic: Normal caliber aorta. No abdominal or pelvic lymphadenopathy.  Reproductive: Normal uterus and ovaries.  Other: No fluid collection or hematoma.  Musculoskeletal: No lytic or sclerotic osseous lesion. No acute osseous abnormality. Total left hip arthroplasty. Degenerative disc disease with disc height loss at L5-S1 mild degenerative disc disease at L4-5. Mild broad-based disc bulge at L4-5 and L5-S1.  IMPRESSION: 1. Dilated appendix measuring 13 mm, but without significant surrounding periappendiceal inflammatory changes or free fluid. This may reflect early acute appendicitis, but correlation with physical exam and laboratory values is recommended given the lack of other ancillary CT findings.   Electronically Signed   By: Elige Ko   On: 02/23/2015 15:54    Medications: . acetaminophen  1,000 mg Oral Q6H  . heparin subcutaneous  5,000 Units Subcutaneous 3 times per day  . lisinopril  10 mg Oral Daily  . meperidine      .  pantoprazole (PROTONIX) IV  40 mg Intravenous QHS    Assessment/Plan Acute appendicitis without mention of peritonitis S/p laparoscopic appendectomy 02/23/15, Dr. Gaynelle AduEric Wilson Hx of hypertension Hx of ulcer x 2 Hx of GERD Hx of anxiety and depression Hx of anemia  Hx of tobacco use Antibiotics:  Cipro/Flagyl pre op only DVT:  Heparin/ SCD    Plan:  Advance  diet, mobilize, decrease fluids, check her after lunch and if OK let her go home. 1345  Still having some pain, a little nausea with lunch.  She lives alone, will keep her tonight and hope she feels ready in AM.    LOS: 0 days    Jeannett Dekoning 02/24/2015

## 2015-03-05 ENCOUNTER — Ambulatory Visit (INDEPENDENT_AMBULATORY_CARE_PROVIDER_SITE_OTHER): Payer: BLUE CROSS/BLUE SHIELD | Admitting: *Deleted

## 2015-03-05 DIAGNOSIS — Z3042 Encounter for surveillance of injectable contraceptive: Secondary | ICD-10-CM

## 2015-03-05 MED ORDER — MEDROXYPROGESTERONE ACETATE 150 MG/ML IM SUSP
150.0000 mg | Freq: Once | INTRAMUSCULAR | Status: AC
  Administered 2015-03-05: 150 mg via INTRAMUSCULAR

## 2015-03-14 ENCOUNTER — Other Ambulatory Visit: Payer: Self-pay | Admitting: Family Medicine

## 2015-06-18 ENCOUNTER — Ambulatory Visit (INDEPENDENT_AMBULATORY_CARE_PROVIDER_SITE_OTHER): Payer: BLUE CROSS/BLUE SHIELD | Admitting: *Deleted

## 2015-06-18 DIAGNOSIS — Z3042 Encounter for surveillance of injectable contraceptive: Secondary | ICD-10-CM

## 2015-06-18 DIAGNOSIS — Z3049 Encounter for surveillance of other contraceptives: Secondary | ICD-10-CM | POA: Diagnosis not present

## 2015-06-18 MED ORDER — MEDROXYPROGESTERONE ACETATE 150 MG/ML IM SUSP
150.0000 mg | Freq: Once | INTRAMUSCULAR | Status: AC
  Administered 2015-06-18: 150 mg via INTRAMUSCULAR

## 2015-06-20 ENCOUNTER — Other Ambulatory Visit: Payer: Self-pay | Admitting: Family Medicine

## 2015-06-28 ENCOUNTER — Ambulatory Visit (INDEPENDENT_AMBULATORY_CARE_PROVIDER_SITE_OTHER): Payer: BLUE CROSS/BLUE SHIELD | Admitting: Internal Medicine

## 2015-06-28 ENCOUNTER — Ambulatory Visit (INDEPENDENT_AMBULATORY_CARE_PROVIDER_SITE_OTHER): Payer: BLUE CROSS/BLUE SHIELD

## 2015-06-28 VITALS — BP 110/70 | HR 103 | Temp 98.2°F | Resp 16 | Ht 68.5 in | Wt 172.0 lb

## 2015-06-28 DIAGNOSIS — Z87768 Personal history of other specified (corrected) congenital malformations of integument, limbs and musculoskeletal system: Secondary | ICD-10-CM

## 2015-06-28 DIAGNOSIS — M62830 Muscle spasm of back: Secondary | ICD-10-CM | POA: Diagnosis not present

## 2015-06-28 DIAGNOSIS — Z87798 Personal history of other (corrected) congenital malformations: Secondary | ICD-10-CM

## 2015-06-28 DIAGNOSIS — M545 Low back pain, unspecified: Secondary | ICD-10-CM

## 2015-06-28 DIAGNOSIS — Z8776 Personal history of (corrected) congenital malformations of integument, limbs and musculoskeletal system: Secondary | ICD-10-CM

## 2015-06-28 MED ORDER — METHOCARBAMOL 750 MG PO TABS: 750.0000 mg | ORAL_TABLET | Freq: Three times a day (TID) | ORAL | Status: DC | PRN

## 2015-06-28 MED ORDER — HYDROCODONE-ACETAMINOPHEN 5-325 MG PO TABS: 1.0000 | ORAL_TABLET | Freq: Four times a day (QID) | ORAL | Status: DC | PRN

## 2015-06-28 NOTE — Patient Instructions (Signed)
Low Back Sprain With Rehab A sprain is an injury in which a ligament is torn. The ligaments of the lower back are vulnerable to sprains. However, they are strong and require great force to be injured. These ligaments are important for stabilizing the spinal column. Sprains are classified into three categories. Grade 1 sprains cause pain, but the tendon is not lengthened. Grade 2 sprains include a lengthened ligament, due to the ligament being stretched or partially ruptured. With grade 2 sprains there is still function, although the function may be decreased. Grade 3 sprains involve a complete tear of the tendon or muscle, and function is usually impaired. SYMPTOMS   Severe pain in the lower back.  Sometimes, a feeling of a "pop," "snap," or tear, at the time of injury.  Tenderness and sometimes swelling at the injury site.  Uncommonly, bruising (contusion) within 48 hours of injury.  Muscle spasms in the back. CAUSES  Low back sprains occur when a force is placed on the ligaments that is greater than they can handle. Common causes of injury include:  Performing a stressful act while off-balance.  Repetitive stressful activities that involve movement of the lower back.  Direct hit (trauma) to the lower back. RISK INCREASES WITH:  Contact sports (football, wrestling).  Collisions (major skiing accidents).  Sports that require throwing or lifting (baseball, weightlifting).  Sports involving twisting of the spine (gymnastics, diving, tennis, golf).  Poor strength and flexibility.  Inadequate protection.  Previous back injury or surgery (especially fusion). PREVENTION  Wear properly fitted and padded protective equipment.  Warm up and stretch properly before activity.  Allow for adequate recovery between workouts.  Maintain physical fitness:  Strength, flexibility, and endurance.  Cardiovascular fitness.  Maintain a healthy body weight. PROGNOSIS  If treated properly,  low back sprains usually heal with non-surgical treatment. The length of time for healing depends on the severity of the injury.  RELATED COMPLICATIONS   Recurring symptoms, resulting in a chronic problem.  Chronic inflammation and pain in the low back.  Delayed healing or resolution of symptoms, especially if activity is resumed too soon.  Prolonged impairment.  Unstable or arthritic joints of the low back. TREATMENT  Treatment first involves the use of ice and medicine, to reduce pain and inflammation. The use of strengthening and stretching exercises may help reduce pain with activity. These exercises may be performed at home or with a therapist. Severe injuries may require referral to a therapist for further evaluation and treatment, such as ultrasound. Your caregiver may advise that you wear a back brace or corset, to help reduce pain and discomfort. Often, prolonged bed rest results in greater harm then benefit. Corticosteroid injections may be recommended. However, these should be reserved for the most serious cases. It is important to avoid using your back when lifting objects. At night, sleep on your back on a firm mattress, with a pillow placed under your knees. If non-surgical treatment is unsuccessful, surgery may be needed.  MEDICATION   If pain medicine is needed, nonsteroidal anti-inflammatory medicines (aspirin and ibuprofen), or other minor pain relievers (acetaminophen), are often advised.  Do not take pain medicine for 7 days before surgery.  Prescription pain relievers may be given, if your caregiver thinks they are needed. Use only as directed and only as much as you need.  Ointments applied to the skin may be helpful.  Corticosteroid injections may be given by your caregiver. These injections should be reserved for the most serious cases, because   they may only be given a certain number of times. HEAT AND COLD  Cold treatment (icing) should be applied for 10 to 15  minutes every 2 to 3 hours for inflammation and pain, and immediately after activity that aggravates your symptoms. Use ice packs or an ice massage.  Heat treatment may be used before performing stretching and strengthening activities prescribed by your caregiver, physical therapist, or athletic trainer. Use a heat pack or a warm water soak. SEEK MEDICAL CARE IF:   Symptoms get worse or do not improve in 2 to 4 weeks, despite treatment.  You develop numbness or weakness in either leg.  You lose bowel or bladder function.  Any of the following occur after surgery: fever, increased pain, swelling, redness, drainage of fluids, or bleeding in the affected area.  New, unexplained symptoms develop. (Drugs used in treatment may produce side effects.) EXERCISES  RANGE OF MOTION (ROM) AND STRETCHING EXERCISES - Low Back Sprain Most people with lower back pain will find that their symptoms get worse with excessive bending forward (flexion) or arching at the lower back (extension). The exercises that will help resolve your symptoms will focus on the opposite motion.  Your physician, physical therapist or athletic trainer will help you determine which exercises will be most helpful to resolve your lower back pain. Do not complete any exercises without first consulting with your caregiver. Discontinue any exercises which make your symptoms worse, until you speak to your caregiver. If you have pain, numbness or tingling which travels down into your buttocks, leg or foot, the goal of the therapy is for these symptoms to move closer to your back and eventually resolve. Sometimes, these leg symptoms will get better, but your lower back pain may worsen. This is often an indication of progress in your rehabilitation. Be very alert to any changes in your symptoms and the activities in which you participated in the 24 hours prior to the change. Sharing this information with your caregiver will allow him or her to most  efficiently treat your condition. These exercises may help you when beginning to rehabilitate your injury. Your symptoms may resolve with or without further involvement from your physician, physical therapist or athletic trainer. While completing these exercises, remember:   Restoring tissue flexibility helps normal motion to return to the joints. This allows healthier, less painful movement and activity.  An effective stretch should be held for at least 30 seconds.  A stretch should never be painful. You should only feel a gentle lengthening or release in the stretched tissue. FLEXION RANGE OF MOTION AND STRETCHING EXERCISES: STRETCH - Flexion, Single Knee to Chest   Lie on a firm bed or floor with both legs extended in front of you.  Keeping one leg in contact with the floor, bring your opposite knee to your chest. Hold your leg in place by either grabbing behind your thigh or at your knee.  Pull until you feel a gentle stretch in your low back. Hold __________ seconds.  Slowly release your grasp and repeat the exercise with the opposite side. Repeat __________ times. Complete this exercise __________ times per day.  STRETCH - Flexion, Double Knee to Chest  Lie on a firm bed or floor with both legs extended in front of you.  Keeping one leg in contact with the floor, bring your opposite knee to your chest.  Tense your stomach muscles to support your back and then lift your other knee to your chest. Hold your legs in   place by either grabbing behind your thighs or at your knees.  Pull both knees toward your chest until you feel a gentle stretch in your low back. Hold __________ seconds.  Tense your stomach muscles and slowly return one leg at a time to the floor. Repeat __________ times. Complete this exercise __________ times per day.  STRETCH - Low Trunk Rotation  Lie on a firm bed or floor. Keeping your legs in front of you, bend your knees so they are both pointed toward the  ceiling and your feet are flat on the floor.  Extend your arms out to the side. This will stabilize your upper body by keeping your shoulders in contact with the floor.  Gently and slowly drop both knees together to one side until you feel a gentle stretch in your low back. Hold for __________ seconds.  Tense your stomach muscles to support your lower back as you bring your knees back to the starting position. Repeat the exercise to the other side. Repeat __________ times. Complete this exercise __________ times per day  EXTENSION RANGE OF MOTION AND FLEXIBILITY EXERCISES: STRETCH - Extension, Prone on Elbows   Lie on your stomach on the floor, a bed will be too soft. Place your palms about shoulder width apart and at the height of your head.  Place your elbows under your shoulders. If this is too painful, stack pillows under your chest.  Allow your body to relax so that your hips drop lower and make contact more completely with the floor.  Hold this position for __________ seconds.  Slowly return to lying flat on the floor. Repeat __________ times. Complete this exercise __________ times per day.  RANGE OF MOTION - Extension, Prone Press Ups  Lie on your stomach on the floor, a bed will be too soft. Place your palms about shoulder width apart and at the height of your head.  Keeping your back as relaxed as possible, slowly straighten your elbows while keeping your hips on the floor. You may adjust the placement of your hands to maximize your comfort. As you gain motion, your hands will come more underneath your shoulders.  Hold this position __________ seconds.  Slowly return to lying flat on the floor. Repeat __________ times. Complete this exercise __________ times per day.  RANGE OF MOTION- Quadruped, Neutral Spine   Assume a hands and knees position on a firm surface. Keep your hands under your shoulders and your knees under your hips. You may place padding under your knees for  comfort.  Drop your head and point your tailbone toward the ground below you. This will round out your lower back like an angry cat. Hold this position for __________ seconds.  Slowly lift your head and release your tail bone so that your back sags into a large arch, like an old horse.  Hold this position for __________ seconds.  Repeat this until you feel limber in your low back.  Now, find your "sweet spot." This will be the most comfortable position somewhere between the two previous positions. This is your neutral spine. Once you have found this position, tense your stomach muscles to support your low back.  Hold this position for __________ seconds. Repeat __________ times. Complete this exercise __________ times per day.  STRENGTHENING EXERCISES - Low Back Sprain These exercises may help you when beginning to rehabilitate your injury. These exercises should be done near your "sweet spot." This is the neutral, low-back arch, somewhere between fully rounded and   fully arched, that is your least painful position. When performed in this safe range of motion, these exercises can be used for people who have either a flexion or extension based injury. These exercises may resolve your symptoms with or without further involvement from your physician, physical therapist or athletic trainer. While completing these exercises, remember:   Muscles can gain both the endurance and the strength needed for everyday activities through controlled exercises.  Complete these exercises as instructed by your physician, physical therapist or athletic trainer. Increase the resistance and repetitions only as guided.  You may experience muscle soreness or fatigue, but the pain or discomfort you are trying to eliminate should never worsen during these exercises. If this pain does worsen, stop and make certain you are following the directions exactly. If the pain is still present after adjustments, discontinue the  exercise until you can discuss the trouble with your caregiver. STRENGTHENING - Deep Abdominals, Pelvic Tilt   Lie on a firm bed or floor. Keeping your legs in front of you, bend your knees so they are both pointed toward the ceiling and your feet are flat on the floor.  Tense your lower abdominal muscles to press your low back into the floor. This motion will rotate your pelvis so that your tail bone is scooping upwards rather than pointing at your feet or into the floor. With a gentle tension and even breathing, hold this position for __________ seconds. Repeat __________ times. Complete this exercise __________ times per day.  STRENGTHENING - Abdominals, Crunches   Lie on a firm bed or floor. Keeping your legs in front of you, bend your knees so they are both pointed toward the ceiling and your feet are flat on the floor. Cross your arms over your chest.  Slightly tip your chin down without bending your neck.  Tense your abdominals and slowly lift your trunk high enough to just clear your shoulder blades. Lifting higher can put excessive stress on the lower back and does not further strengthen your abdominal muscles.  Control your return to the starting position. Repeat __________ times. Complete this exercise __________ times per day.  STRENGTHENING - Quadruped, Opposite UE/LE Lift   Assume a hands and knees position on a firm surface. Keep your hands under your shoulders and your knees under your hips. You may place padding under your knees for comfort.  Find your neutral spine and gently tense your abdominal muscles so that you can maintain this position. Your shoulders and hips should form a rectangle that is parallel with the floor and is not twisted.  Keeping your trunk steady, lift your right hand no higher than your shoulder and then your left leg no higher than your hip. Make sure you are not holding your breath. Hold this position for __________ seconds.  Continuing to keep  your abdominal muscles tense and your back steady, slowly return to your starting position. Repeat with the opposite arm and leg. Repeat __________ times. Complete this exercise __________ times per day.  STRENGTHENING - Abdominals and Quadriceps, Straight Leg Raise   Lie on a firm bed or floor with both legs extended in front of you.  Keeping one leg in contact with the floor, bend the other knee so that your foot can rest flat on the floor.  Find your neutral spine, and tense your abdominal muscles to maintain your spinal position throughout the exercise.  Slowly lift your straight leg off the floor about 6 inches for a count of   15, making sure to not hold your breath.  Still keeping your neutral spine, slowly lower your leg all the way to the floor. Repeat this exercise with each leg __________ times. Complete this exercise __________ times per day. POSTURE AND BODY MECHANICS CONSIDERATIONS - Low Back Sprain Keeping correct posture when sitting, standing or completing your activities will reduce the stress put on different body tissues, allowing injured tissues a chance to heal and limiting painful experiences. The following are general guidelines for improved posture. Your physician or physical therapist will provide you with any instructions specific to your needs. While reading these guidelines, remember:  The exercises prescribed by your provider will help you have the flexibility and strength to maintain correct postures.  The correct posture provides the best environment for your joints to work. All of your joints have less wear and tear when properly supported by a spine with good posture. This means you will experience a healthier, less painful body.  Correct posture must be practiced with all of your activities, especially prolonged sitting and standing. Correct posture is as important when doing repetitive low-stress activities (typing) as it is when doing a single heavy-load  activity (lifting). RESTING POSITIONS Consider which positions are most painful for you when choosing a resting position. If you have pain with flexion-based activities (sitting, bending, stooping, squatting), choose a position that allows you to rest in a less flexed posture. You would want to avoid curling into a fetal position on your side. If your pain worsens with extension-based activities (prolonged standing, working overhead), avoid resting in an extended position such as sleeping on your stomach. Most people will find more comfort when they rest with their spine in a more neutral position, neither too rounded nor too arched. Lying on a non-sagging bed on your side with a pillow between your knees, or on your back with a pillow under your knees will often provide some relief. Keep in mind, being in any one position for a prolonged period of time, no matter how correct your posture, can still lead to stiffness. PROPER SITTING POSTURE In order to minimize stress and discomfort on your spine, you must sit with correct posture. Sitting with good posture should be effortless for a healthy body. Returning to good posture is a gradual process. Many people can work toward this most comfortably by using various supports until they have the flexibility and strength to maintain this posture on their own. When sitting with proper posture, your ears will fall over your shoulders and your shoulders will fall over your hips. You should use the back of the chair to support your upper back. Your lower back will be in a neutral position, just slightly arched. You may place a small pillow or folded towel at the base of your lower back for  support.  When working at a desk, create an environment that supports good, upright posture. Without extra support, muscles tire, which leads to excessive strain on joints and other tissues. Keep these recommendations in mind: CHAIR:  A chair should be able to slide under your desk  when your back makes contact with the back of the chair. This allows you to work closely.  The chair's height should allow your eyes to be level with the upper part of your monitor and your hands to be slightly lower than your elbows. BODY POSITION  Your feet should make contact with the floor. If this is not possible, use a foot rest.  Keep your ears   over your shoulders. This will reduce stress on your neck and low back. INCORRECT SITTING POSTURES  If you are feeling tired and unable to assume a healthy sitting posture, do not slouch or slump. This puts excessive strain on your back tissues, causing more damage and pain. Healthier options include:  Using more support, like a lumbar pillow.  Switching tasks to something that requires you to be upright or walking.  Talking a brief walk.  Lying down to rest in a neutral-spine position. PROLONGED STANDING WHILE SLIGHTLY LEANING FORWARD  When completing a task that requires you to lean forward while standing in one place for a long time, place either foot up on a stationary 2-4 inch high object to help maintain the best posture. When both feet are on the ground, the lower back tends to lose its slight inward curve. If this curve flattens (or becomes too large), then the back and your other joints will experience too much stress, tire more quickly, and can cause pain. CORRECT STANDING POSTURES Proper standing posture should be assumed with all daily activities, even if they only take a few moments, like when brushing your teeth. As in sitting, your ears should fall over your shoulders and your shoulders should fall over your hips. You should keep a slight tension in your abdominal muscles to brace your spine. Your tailbone should point down to the ground, not behind your body, resulting in an over-extended swayback posture.  INCORRECT STANDING POSTURES  Common incorrect standing postures include a forward head, locked knees and/or an excessive  swayback. WALKING Walk with an upright posture. Your ears, shoulders and hips should all line-up. PROLONGED ACTIVITY IN A FLEXED POSITION When completing a task that requires you to bend forward at your waist or lean over a low surface, try to find a way to stabilize 3 out of 4 of your limbs. You can place a hand or elbow on your thigh or rest a knee on the surface you are reaching across. This will provide you more stability, so that your muscles do not tire as quickly. By keeping your knees relaxed, or slightly bent, you will also reduce stress across your lower back. CORRECT LIFTING TECHNIQUES DO :  Assume a wide stance. This will provide you more stability and the opportunity to get as close as possible to the object which you are lifting.  Tense your abdominals to brace your spine. Bend at the knees and hips. Keeping your back locked in a neutral-spine position, lift using your leg muscles. Lift with your legs, keeping your back straight.  Test the weight of unknown objects before attempting to lift them.  Try to keep your elbows locked down at your sides in order get the best strength from your shoulders when carrying an object.  Always ask for help when lifting heavy or awkward objects. INCORRECT LIFTING TECHNIQUES DO NOT:   Lock your knees when lifting, even if it is a small object.  Bend and twist. Pivot at your feet or move your feet when needing to change directions.  Assume that you can safely pick up even a paperclip without proper posture.   This information is not intended to replace advice given to you by your health care provider. Make sure you discuss any questions you have with your health care provider.   Document Released: 07/30/2005 Document Revised: 08/20/2014 Document Reviewed: 11/11/2008 [employer] Patient Education 2016 [employer]  

## 2015-06-28 NOTE — Progress Notes (Signed)
Patient ID: Lisa [name], female   DOB: 09/05/1970, [age] y.o.   MRN: [id number]   06/28/2015 at 8:49 AM  Lisa PonderKim M [name] / DOB: 06/09/1971 / MRN: [id number]  Problem list reviewed and updated by me where necessary.   SUBJECTIVE  Lisa [name] is a 44 y.o. ill appearing female presenting for the chief complaint of right low back pain. No radiation into leg. No weak or numb. No incontinence. Has past hx of bilateral avascular necrosis both hips with left hip total replacement. Was told right hip may need replacing..   Dr. Magnus IvanBlackman her orthopedist. And follows her hips.  She  has a past medical history of Hyperlipidemia; Hypertension; Ulcer; Heart murmur; Anxiety (20 years ago); Depression (20 years ago); GERD (gastroesophageal reflux disease); and Anemia.    Medications reviewed and updated by myself where necessary, and exist elsewhere in the encounter.   Lisa [name] is allergic to nsaids. She  reports that she quit smoking about 22 months ago. Her smoking use included Cigarettes. She has a 5 pack-year smoking history. She has never used smokeless tobacco. She reports that she drinks alcohol. She reports that she does not use illicit drugs. She  reports that she does not engage in sexual activity. The patient  has past surgical history that includes No past surgeries; Esophagogastroduodenoscopy endoscopy; Total hip arthroplasty (Left, 02/06/2013); and laparoscopic appendectomy (N/A, 02/23/2015).  Her family history is not on file. She was adopted.  Review of Systems  Constitutional: Negative for fever.  Respiratory: Negative for shortness of breath.   Cardiovascular: Negative for chest pain.  Gastrointestinal: Negative for nausea.  Musculoskeletal: Positive for back pain and joint pain.  Skin: Negative for rash.  Neurological: Negative for dizziness, sensory change, focal weakness and headaches.    OBJECTIVE  Her  height is 5' 8.5" (1.74 m) and weight is 172 lb (78.019 kg). Her oral temperature  is 98.2 F (36.8 C). Her blood pressure is 110/70 and her pulse is 103. Her respiration is 16 and oxygen saturation is 98%.  The patient's body mass index is 25.77 kg/(m^2).  Physical Exam  Constitutional: She is oriented to person, place, and time. She appears well-developed and well-nourished. She appears distressed.  HENT:  Head: Normocephalic.  Eyes: Conjunctivae are normal. No scleral icterus.  Neck: Normal range of motion.  Cardiovascular: Normal rate.   Respiratory: Effort normal.  GI: She exhibits no distension.  Musculoskeletal:       Lumbar back: She exhibits decreased range of motion, tenderness, pain and spasm. She exhibits no bony tenderness, no swelling, no edema and normal pulse.       Back:  Point tender SI joint right  Neurological: She is alert and oriented to person, place, and time. She has normal strength. No sensory deficit. Gait abnormal. Coordination normal.  Reflex Scores:      Patellar reflexes are 2+ on the right side and 2+ on the left side.      Achilles reflexes are 0 on the right side and 0 on the left side. Skin: Skin is warm and dry. No rash noted.  Psychiatric: She has a normal mood and affect.   [facility] reading (PRIMARY) by  Dr.Shyna Duignan. Right hip possible AVN femoral head. L5-S1 appears to have increased density of bone.    No results found for this or any previous visit (from the past 24 hour(s)).  ASSESSMENT & PLAN  Lisa [name] was seen today for back pain and hip pain.  Diagnoses and all  orders for this visit:  Right-sided low back pain without sciatica -     DG HIP UNILAT W OR W/O PELVIS 2-3 VIEWS RIGHT -     DG Lumbar Spine 2-3 Views  Hx of developmental dysplasia of the hip -     DG HIP UNILAT W OR W/O PELVIS 2-3 VIEWS RIGHT -     DG Lumbar Spine 2-3 Views

## 2015-06-28 NOTE — Progress Notes (Deleted)
    MRN: [id number] DOB: 05/23/1971  Subjective:   Dairl PonderKim M Andres is a 44 y.o. female presenting for chief complaint of Back Pain and Hip Pain  Reports ***. Has *** tried *** relief. Denies ***. *** smoking, *** alcohol. Denies any other aggravating or relieving factors, no other questions or concerns.  [name] has a current medication list which includes the following prescription(s): acetaminophen, diphenoxylate-atropine, OVER THE COUNTER MEDICATION, oxycodone, pantoprazole, simvastatin, trazodone, and lisinopril. Also is allergic to nsaids.  [name]  has a past medical history of Hyperlipidemia; Hypertension; Ulcer; Heart murmur; Anxiety (20 years ago); Depression (20 years ago); GERD (gastroesophageal reflux disease); and Anemia. Also  has past surgical history that includes No past surgeries; Esophagogastroduodenoscopy endoscopy; Total hip arthroplasty (Left, 02/06/2013); and laparoscopic appendectomy (N/A, 02/23/2015).  Objective:   Vitals: BP 110/70 mmHg  Pulse 103  Temp(Src) 98.2 F (36.8 C) (Oral)  Resp 16  Ht 5' 8.5" (1.74 m)  Wt 172 lb (78.019 kg)  BMI 25.77 kg/m2  SpO2 98%  Physical Exam  No results found for this or any previous visit (from the past 24 hour(s)).  Assessment and Plan :     Wallis BambergMario Ryann Leavitt, PA-C [facility] [phone] 06/28/2015 8:17 AM

## 2015-07-11 ENCOUNTER — Other Ambulatory Visit: Payer: Self-pay | Admitting: Orthopedic Surgery

## 2015-07-11 DIAGNOSIS — M545 Low back pain: Secondary | ICD-10-CM

## 2015-07-22 ENCOUNTER — Encounter: Payer: Self-pay | Admitting: Family Medicine

## 2015-07-22 ENCOUNTER — Ambulatory Visit
Admission: RE | Admit: 2015-07-22 | Discharge: 2015-07-22 | Disposition: A | Discharge status: Home / Self Care | Payer: BLUE CROSS/BLUE SHIELD | Source: Ambulatory Visit | Attending: Orthopedic Surgery | Admitting: Orthopedic Surgery

## 2015-07-22 DIAGNOSIS — G4700 Insomnia, unspecified: Secondary | ICD-10-CM

## 2015-07-22 DIAGNOSIS — M545 Low back pain: Secondary | ICD-10-CM

## 2015-07-24 MED ORDER — TRAZODONE HCL 50 MG PO TABS: ORAL_TABLET | ORAL | Status: DC

## 2015-08-02 ENCOUNTER — Other Ambulatory Visit: Payer: Self-pay | Admitting: Family Medicine

## 2015-09-01 ENCOUNTER — Encounter: Payer: Self-pay | Admitting: Family Medicine

## 2015-09-02 ENCOUNTER — Encounter: Payer: Self-pay | Admitting: Family Medicine

## 2015-09-07 ENCOUNTER — Encounter: Payer: Self-pay | Admitting: Family Medicine

## 2015-09-10 ENCOUNTER — Ambulatory Visit (INDEPENDENT_AMBULATORY_CARE_PROVIDER_SITE_OTHER): Payer: BLUE CROSS/BLUE SHIELD

## 2015-09-10 DIAGNOSIS — Z3049 Encounter for surveillance of other contraceptives: Secondary | ICD-10-CM | POA: Diagnosis not present

## 2015-09-10 DIAGNOSIS — Z3042 Encounter for surveillance of injectable contraceptive: Secondary | ICD-10-CM

## 2015-09-10 MED ORDER — MEDROXYPROGESTERONE ACETATE 150 MG/ML IM SUSP
150.0000 mg | Freq: Once | INTRAMUSCULAR | Status: AC
  Administered 2015-09-10: 150 mg via INTRAMUSCULAR

## 2015-09-10 MED ORDER — MEDROXYPROGESTERONE ACETATE 150 MG/ML IM SUSY
150.0000 mg | PREFILLED_SYRINGE | Freq: Once | INTRAMUSCULAR | Status: AC
  Administered 2016-02-27: 150 mg via INTRAMUSCULAR

## 2015-09-10 NOTE — Progress Notes (Signed)
Pt. Came in for her depo shot, she was within her window

## 2015-10-26 ENCOUNTER — Ambulatory Visit (INDEPENDENT_AMBULATORY_CARE_PROVIDER_SITE_OTHER): Payer: BLUE CROSS/BLUE SHIELD | Admitting: Family Medicine

## 2015-10-26 VITALS — BP 126/78 | HR 100 | Temp 99.1°F | Resp 17 | Ht 67.5 in | Wt 175.0 lb

## 2015-10-26 DIAGNOSIS — R110 Nausea: Secondary | ICD-10-CM

## 2015-10-26 LAB — POCT INFLUENZA A/B
Influenza A, POC: NEGATIVE
Influenza B, POC: NEGATIVE

## 2015-10-26 MED ORDER — ONDANSETRON 8 MG PO TBDP: 8.0000 mg | ORAL_TABLET | Freq: Three times a day (TID) | ORAL | Status: DC | PRN

## 2015-10-26 NOTE — Patient Instructions (Addendum)
This appears to be an acute viral stomach bug. The flu test is negative so at this point I think we just need to give you medicine for the symptoms, continue the Tylenol, and try to get you to tolerate clear liquids. We'll be giving you a note to be out until Friday

## 2015-10-26 NOTE — Progress Notes (Signed)
Patient ID: Lisa [name], female   DOB: 23-Jan-1971, 45 y.o.   MRN: [id number]  By signing my name below, I, Essence Howell, attest that this documentation has been prepared under the direction and in the presence of Elvina Sidle, MD Electronically Signed: Charline Bills, ED Scribe 10/26/2015 at 1:40 PM.  Patient ID: Lisa [name] MRN: [id number], DOB: 01/11/1971, 45 y.o. Date of Encounter: 10/26/2015, 1:26 PM  Primary Physician: Abbe Amsterdam, MD  Chief Complaint:  Chief Complaint  Patient presents with  . Dizziness  . Headache  . Back Pain  . Nausea   HPI: 45 y.o. year old female with history below presents with intermittent dizziness onset 2 days ago. Pt states that she woke up and noticed dizziness when she got out of bed 2 days ago. She reports associated fever with Tmax of 100 F, chills, loss of appetite and sharp chest pain while she was experiencing the dizziness 2 days ago. She further reports associated back pain, throbbing frontal HA, light-headedness, nausea onset today. No treatments tried PTA. She denies sneezing, sore throat, cough. Pt cannot take NSAIDs due to h/o ulcers.   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  Past Medical History  Diagnosis Date  . Hyperlipidemia   . Hypertension   . Ulcer     x2  . Heart murmur   . Anxiety 20 years ago  . Depression 20 years ago  . GERD (gastroesophageal reflux disease)   . Anemia     hx of    Home Meds: Prior to Admission medications   Medication Sig Start Date End Date Taking? Authorizing Provider  acetaminophen (TYLENOL) 500 MG tablet Take 1,000 mg by mouth every 6 (six) hours as needed for headache.   Yes Historical Provider, MD  HYDROcodone-acetaminophen (NORCO/VICODIN) 5-325 MG tablet Take 1 tablet by mouth every 6 (six) hours as needed. 06/28/15  Yes Jonita Albee, MD  methocarbamol (ROBAXIN-750) 750 MG tablet Take 1 tablet (750 mg total) by mouth every 8 (eight) hours as needed for muscle  spasms. 06/28/15  Yes Jonita Albee, MD  OVER THE COUNTER MEDICATION Take 1 tablet by mouth at bedtime as needed (sleep). [employer] Brand Sleep aid as needed   Yes Historical Provider, MD  pantoprazole (PROTONIX) 40 MG tablet Take 40 mg by mouth every morning.   Yes Historical Provider, MD  simvastatin (ZOCOR) 20 MG tablet Take 1 tablet (20 mg total) by mouth every evening. 12/10/14  Yes Pearline Cables, MD  traZODone (DESYREL) 50 MG tablet Take 1 to 2 tablets (50- ) at bedtime as needed for sleep 07/24/15  Yes Pearline Cables, MD   Allergies:  Allergies  Allergen Reactions  . Nsaids Other (See Comments)    ulcers   Social History   Social History  . Marital Status: Single    Spouse Name: N/A  . Number of Children: N/A  . Years of Education: N/A   Occupational History  . Not on file.   Social History Main Topics  . Smoking status: Former Smoker -- 0.25 packs/day for 20 years    Types: Cigarettes    Quit date: 08/13/2013  . Smokeless tobacco: Never Used  . Alcohol Use: 0.0 oz/week     Comment: few glasses of wine a week  . Drug Use: No  . Sexual Activity: No   Other Topics Concern  . Not on file   Social History Narrative    Review of Systems: Constitutional: negative  for night sweats, weight changes, or fatigue, +chills, +fever, +appetite change HEENT: negative for vision changes, hearing loss, congestion, rhinorrhea, -ST, -sneezing, epistaxis, or sinus pressure Cardiovascular: negative for palpitations, +chest pain Respiratory: negative for hemoptysis, wheezing, shortness of breath, or -cough Abdominal: negative for abdominal pain, vomiting, diarrhea, or constipation, +nausea Msk: +back pain Dermatological: negative for rash Neurologic: negative for syncope, +headache, +dizziness, +light-headedness All other systems reviewed and are otherwise negative with the exception to those above and in the HPI.  Physical Exam: Blood pressure 126/78, pulse 100, temperature  99.1 F (37.3 C), temperature source Oral, resp. rate 17, height 5' 7.5" (1.715 m), weight 175 lb (79.379 kg), SpO2 98 %., Body mass index is 26.99 kg/(m^2). General: Well developed, well nourished, in no acute distress. Head: Normocephalic, atraumatic, eyes without discharge, sclera non-icteric, nares are without discharge. Bilateral auditory canals clear, TM's are without perforation, pearly grey and translucent with reflective cone of light bilaterally. Oral cavity moist, posterior pharynx without exudate, erythema, peritonsillar abscess, or post nasal drip.   Neck: Supple. No thyromegaly. Full ROM. No lymphadenopathy. Lungs: Clear bilaterally to auscultation without wheezes, rales, or rhonchi. Breathing is unlabored. Heart: RRR with S1 S2. No murmurs, rubs, or gallops appreciated. Abdomen: Hyperactive bowel sounds. Non-tender. No hepatomegaly. No rebound/guarding. No obvious abdominal masses.  Msk:  Strength and tone normal for age. Extremities/Skin: Warm and dry. No clubbing or cyanosis. No edema. No rashes or suspicious lesions. Neuro: Alert and oriented X 3. Moves all extremities spontaneously. Gait is normal. CNII-XII grossly in tact. Psych:  Responds to questions appropriately with a normal affect.   Labs: Results for orders placed or performed in visit on 10/26/15  POCT Influenza A/B  Result Value Ref Range   Influenza A, POC Negative Negative   Influenza B, POC Negative Negative     ASSESSMENT AND PLAN:  45 y.o. year old female with  This chart was scribed in my presence and reviewed by me personally.    ICD-9-CM ICD-10-CM   1. Nausea without vomiting 787.02 R11.0 POCT Influenza A/B     ondansetron (ZOFRAN-ODT) 8 MG disintegrating tablet  clear liquids  Signed, Elvina SidleKurt Tiaja Hagan, MD 10/26/2015 1:26 PM

## 2015-11-07 ENCOUNTER — Ambulatory Visit (INDEPENDENT_AMBULATORY_CARE_PROVIDER_SITE_OTHER): Payer: BLUE CROSS/BLUE SHIELD | Admitting: Family Medicine

## 2015-11-07 ENCOUNTER — Encounter: Payer: Self-pay | Admitting: Family Medicine

## 2015-11-07 VITALS — BP 112/76 | HR 86 | Temp 98.1°F | Ht 68.0 in | Wt 175.8 lb

## 2015-11-07 DIAGNOSIS — E785 Hyperlipidemia, unspecified: Secondary | ICD-10-CM | POA: Diagnosis not present

## 2015-11-07 DIAGNOSIS — G43009 Migraine without aura, not intractable, without status migrainosus: Secondary | ICD-10-CM

## 2015-11-07 DIAGNOSIS — N951 Menopausal and female climacteric states: Secondary | ICD-10-CM

## 2015-11-07 DIAGNOSIS — R232 Flushing: Secondary | ICD-10-CM

## 2015-11-07 DIAGNOSIS — Z130 Encounter for screening for diseases of the blood and blood-forming organs and certain disorders involving the immune mechanism: Secondary | ICD-10-CM | POA: Diagnosis not present

## 2015-11-07 DIAGNOSIS — Z0000 Encounter for general adult medical examination without abnormal findings: Secondary | ICD-10-CM | POA: Diagnosis not present

## 2015-11-07 DIAGNOSIS — Z23 Encounter for immunization: Secondary | ICD-10-CM

## 2015-11-07 MED ORDER — SUMATRIPTAN SUCCINATE 50 MG PO TABS: 50.0000 mg | ORAL_TABLET | ORAL | Status: DC | PRN

## 2015-11-07 NOTE — Patient Instructions (Addendum)
I will be in touch with your labs asap-[facility] to see if you might be going through menopause  Let's try imitrex for your migraine headaches to see if this might help.  Take one at the first sign of the headache and repeat in 2 hours if needed.  Let me know how this does for you Do be sure to do a mammogram this year.

## 2015-11-07 NOTE — Progress Notes (Signed)
[facility] [place] [address], [address] [place], [place] [address] [phone] [phone]  Date:  11/07/2015   Name:  Lisa PonderKim M [name]   DOB:  10/06/1970   MRN:  [id number]  PCP:  Abbe AmsterdamOPLAND,Paxson Harrower, MD    Chief Complaint: Annual Exam   History of Present Illness:  Lisa [name] is a 45 y.o. very pleasant female patient who presents with the following:  History of HTN, hyperlipidemia.  Here today for an annual exam Most recent pap 2015 was normal- would prefer to wait and have her pap next year She got sick a couple of week ago with a GI bug, these sx are now better.   She also thinks that she might have migraine HA.  She will have a severe HA every now and again which sounds like migraine to her. She may be sensitive to sounds, she prefers to lie down in a quiet room.  She will use tylenol sometimes as she cannot use NSAIDs She may get nausea, sometimes dizziness with the HA.  She will generally not vomit with her HA however.   Never had an aura.    She had some back pain in the fall- she was found to have a nerve root compression.  This is getting better and is why she has norco and robaxin on her medication list  Her daughter Eligha BridegroomCaitlyn is doing well  Mammo was in 2015. She plans to do this year.  She wonders if she might be going through menopause- she has noted that she feels hot easily.    She had a sandwich about 3.5 hours ago- however has not eaten "a real meal" so far today.    She quit smoking, is not drinking alcohol except rarely.  She has been eating more and is not as active due to MSK issues - she thinks this is why she has gained some weight  Tetanus shot is coming due to summer- she would like to do booster today to make sure she is covered.    Patient Active Problem List   Diagnosis Date Noted  . History of abnormal cervical Pap smear 09/21/2013  . Leukocytopenia, unspecified 03/02/2013  . S/P hip replacement 02/06/2013  .  Hyperlipidemia 10/13/2012  . HTN (hypertension) 10/13/2012    Past Medical History  Diagnosis Date  . Hyperlipidemia   . Hypertension   . Ulcer     x2  . Heart murmur   . Anxiety 20 years ago  . Depression 20 years ago  . GERD (gastroesophageal reflux disease)   . Anemia     hx of    Past Surgical History  Procedure Laterality Date  . No past surgeries    . Esophagogastroduodenoscopy endoscopy      at least 4  . Total hip arthroplasty Left 02/06/2013    Procedure: LEFT TOTAL HIP ARTHROPLASTY ANTERIOR APPROACH;  Surgeon: Kathryne Hitchhristopher Y Blackman, MD;  Location: [facility];  Service: Orthopedics;  Laterality: Left;  . Laparoscopic appendectomy N/A 02/23/2015    Procedure: APPENDECTOMY LAPAROSCOPIC;  Surgeon: Gaynelle AduEric Wilson, MD;  Location: [facility];  Service: General;  Laterality: N/A;    Social History  Substance Use Topics  . Smoking status: Former Smoker -- 0.25 packs/day for 20 years    Types: Cigarettes    Quit date: 08/13/2013  . Smokeless tobacco: Never Used  . Alcohol Use: 0.0 oz/week     Comment: few glasses of wine a week  Family History  Problem Relation Age of Onset  . Adopted: Yes    Allergies  Allergen Reactions  . Nsaids Other (See Comments)    ulcers    Medication list has been reviewed and updated.  Current Outpatient Prescriptions on File Prior to Visit  Medication Sig Dispense Refill  . acetaminophen (TYLENOL) 500 MG tablet Take 1,000 mg by mouth every 6 (six) hours as needed for headache.    Marland Kitchen HYDROcodone-acetaminophen (NORCO/VICODIN) 5-325 MG tablet Take 1 tablet by mouth every 6 (six) hours as needed. 30 tablet 0  . methocarbamol (ROBAXIN-750) 750 MG tablet Take 1 tablet (750 mg total) by mouth every 8 (eight) hours as needed for muscle spasms. 30 tablet 0  . ondansetron (ZOFRAN-ODT) 8 MG disintegrating tablet Take 1 tablet (8 mg total) by mouth every 8 (eight) hours as needed for nausea. 12 tablet 0  . OVER THE COUNTER MEDICATION Take 1 tablet by  mouth at bedtime as needed (sleep). [employer] Brand Sleep aid as needed    . pantoprazole (PROTONIX) 40 MG tablet Take 40 mg by mouth every morning.    . simvastatin (ZOCOR) 20 MG tablet Take 1 tablet (20 mg total) by mouth every evening. 90 tablet 2  . traZODone (DESYREL) 50 MG tablet Take 1 to 2 tablets (50- ) at bedtime as needed for sleep 180 tablet 2   Current Facility-Administered Medications on File Prior to Visit  Medication Dose Route Frequency Provider Last Rate Last Dose  . MedroxyPROGESTERone Acetate SUSY 150 mg  150 mg Intramuscular Once [employer], PA-C        Review of Systems:  As per HPI- otherwise negative.   Physical Examination: Filed Vitals:   11/07/15 1617  BP: 112/76  Pulse: 86  Temp: 98.1 F (36.7 C)   Filed Vitals:   11/07/15 1617  Height:  (1.727 m)  Weight: 175 lb 12.8 oz (79.742 kg)   Body mass index is 26.74 kg/(m^2). Ideal Body Weight: Weight in (lb) to have BMI = 25: 164.1   GEN: WDWN, NAD, Non-toxic, A & O x 3, mild overweight, looks well  HEENT: Atraumatic, Normocephalic. Neck supple. No masses, No LAD.  Bilateral TM wnl, oropharynx normal.  PEERL,EOMI.  Thyroid seems a bit full- may be just weight gain Ears and Nose: No external deformity. CV: RRR, No M/G/R. No JVD. No thrill. No extra heart sounds. PULM: CTA B, no wheezes, crackles, rhonchi. No retractions. No resp. distress. No accessory muscle use. ABD: S, NT, ND. No rebound. No HSM. EXTR: No c/c/e NEURO Normal gait.  PSYCH: Normally interactive. Conversant. Not depressed or anxious appearing.  Calm demeanor.   Assessment and Plan: Physical exam - Plan: Td vaccine greater than or equal to 7yo preservative free IM  Screening for deficiency anemia - Plan: CBC  Hot flashes - Plan: FSH, Comprehensive metabolic panel, TSH  Hyperlipidemia - Plan: Lipid panel  Migraine without aura and without status migrainosus, not intractable - Plan: SUMAtriptan (IMITREX) 50 MG  tablet  Immunization due - Plan: Td vaccine greater than or equal to 7yo preservative free IM  Here today for a physical exam.  Updated tetanus, reminded to have a mammogram this year.   Will try imitrex for her HA as they may be migraine.  Will check TSH due to weight gain Will check FSH as she is curious about menopause, has noted some hot flashes.  She has been on depo provera for many years and does not get menses  Will plan further follow- up pending labs.   Signed Abbe Amsterdam, MD

## 2015-11-08 LAB — LIPID PANEL
CHOLESTEROL: 218 mg/dL — AB (ref 0–200)
HDL: 55.6 mg/dL (ref >39.00)
NonHDL: 162.39
Total CHOL/HDL Ratio: 4
Triglycerides: 320 mg/dL — ABNORMAL HIGH (ref 0.0–149.0)
VLDL: 64 mg/dL — AB (ref 0.0–40.0)

## 2015-11-08 LAB — COMPREHENSIVE METABOLIC PANEL
ALBUMIN: 4.6 g/dL (ref 3.5–5.2)
ALT: 23 U/L (ref 0–35)
AST: 23 U/L (ref 0–37)
Alkaline Phosphatase: 47 U/L (ref 39–117)
BUN: 11 mg/dL (ref 6–23)
CO2: 24 meq/L (ref 19–32)
Calcium: 9.8 mg/dL (ref 8.4–10.5)
Chloride: 104 mEq/L (ref 96–112)
Creatinine, Ser: 0.64 mg/dL (ref 0.40–1.20)
GFR: 107.53 mL/min (ref >60.00)
Glucose, Bld: 95 mg/dL (ref 70–99)
POTASSIUM: 4.4 meq/L (ref 3.5–5.1)
Sodium: 138 mEq/L (ref 135–145)
Total Bilirubin: 0.3 mg/dL (ref 0.2–1.2)
Total Protein: 7.6 g/dL (ref 6.0–8.3)

## 2015-11-08 LAB — CBC
HEMATOCRIT: 37.5 % (ref 36.0–46.0)
Hemoglobin: 12.6 g/dL (ref 12.0–15.0)
MCHC: 33.6 g/dL (ref 30.0–36.0)
MCV: 85.8 fl (ref 78.0–100.0)
Platelets: 220 10*3/uL (ref 150.0–400.0)
RBC: 4.37 Mil/uL (ref 3.87–5.11)
RDW: 13.1 % (ref 11.5–15.5)
WBC: 5.7 10*3/uL (ref 4.0–10.5)

## 2015-11-08 LAB — LDL CHOLESTEROL, DIRECT: LDL DIRECT: 115 mg/dL

## 2015-11-08 LAB — FOLLICLE STIMULATING HORMONE: FSH: 9.8 m[IU]/mL

## 2015-11-08 LAB — TSH: TSH: 1.14 u[IU]/mL (ref 0.35–4.50)

## 2015-12-03 ENCOUNTER — Ambulatory Visit (INDEPENDENT_AMBULATORY_CARE_PROVIDER_SITE_OTHER): Payer: BLUE CROSS/BLUE SHIELD

## 2015-12-03 DIAGNOSIS — Z3049 Encounter for surveillance of other contraceptives: Secondary | ICD-10-CM | POA: Diagnosis not present

## 2015-12-03 DIAGNOSIS — Z3042 Encounter for surveillance of injectable contraceptive: Secondary | ICD-10-CM

## 2015-12-03 MED ORDER — MEDROXYPROGESTERONE ACETATE 150 MG/ML IM SUSP
150.0000 mg | Freq: Once | INTRAMUSCULAR | Status: AC
  Administered 2015-12-03: 150 mg via INTRAMUSCULAR

## 2015-12-03 NOTE — Progress Notes (Signed)
Pt. Came in for depo shot she was within her window

## 2016-01-21 DIAGNOSIS — Z1231 Encounter for screening mammogram for malignant neoplasm of breast: Secondary | ICD-10-CM | POA: Diagnosis not present

## 2016-01-26 ENCOUNTER — Encounter: Payer: Self-pay | Admitting: Family Medicine

## 2016-01-26 MED ORDER — DIPHENOXYLATE-ATROPINE 2.5-0.025 MG PO TABS: 1.0000 | ORAL_TABLET | Freq: Four times a day (QID) | ORAL | Status: DC | PRN

## 2016-02-09 ENCOUNTER — Encounter: Payer: Self-pay | Admitting: Family Medicine

## 2016-02-27 ENCOUNTER — Ambulatory Visit (INDEPENDENT_AMBULATORY_CARE_PROVIDER_SITE_OTHER): Payer: BLUE CROSS/BLUE SHIELD | Admitting: Physician Assistant

## 2016-02-27 DIAGNOSIS — Z3049 Encounter for surveillance of other contraceptives: Secondary | ICD-10-CM

## 2016-02-27 DIAGNOSIS — Z3042 Encounter for surveillance of injectable contraceptive: Secondary | ICD-10-CM

## 2016-02-27 NOTE — Progress Notes (Signed)
Patient seen for Depo- Provera injection

## 2016-02-27 NOTE — Patient Instructions (Signed)
     IF you received an x-ray today, you will receive an invoice from [facility]. Please contact [facility] at [phone] with questions or concerns regarding your invoice.   IF you received labwork today, you will receive an invoice from [facility]. Please contact [facility] at [phone] with questions or concerns regarding your invoice.   Our billing staff will not be able to assist you with questions regarding bills from these companies.  You will be contacted with the lab results as soon as they are available. The fastest way to get your results is to activate your My Chart account. Instructions are located on the last page of this paperwork. If you have not heard from us regarding the results in 2 weeks, please contact this office.      

## 2016-04-13 ENCOUNTER — Other Ambulatory Visit: Payer: Self-pay

## 2016-04-13 ENCOUNTER — Encounter: Payer: Self-pay | Admitting: Family Medicine

## 2016-04-13 DIAGNOSIS — E785 Hyperlipidemia, unspecified: Secondary | ICD-10-CM

## 2016-04-13 DIAGNOSIS — G4700 Insomnia, unspecified: Secondary | ICD-10-CM

## 2016-04-13 MED ORDER — SIMVASTATIN 20 MG PO TABS: 20.0000 mg | ORAL_TABLET | Freq: Every evening | ORAL | 2 refills | Status: DC

## 2016-04-13 MED ORDER — TRAZODONE HCL 50 MG PO TABS: ORAL_TABLET | ORAL | 2 refills | Status: DC

## 2016-04-13 NOTE — Telephone Encounter (Signed)
Please schedule pt for follow up appt. Thank you, ----PC

## 2016-04-19 NOTE — Telephone Encounter (Signed)
LVM advising patient of message below °

## 2016-05-15 DIAGNOSIS — Z23 Encounter for immunization: Secondary | ICD-10-CM | POA: Diagnosis not present

## 2016-05-23 ENCOUNTER — Encounter: Payer: Self-pay | Admitting: Family Medicine

## 2016-05-23 ENCOUNTER — Ambulatory Visit (INDEPENDENT_AMBULATORY_CARE_PROVIDER_SITE_OTHER): Payer: BLUE CROSS/BLUE SHIELD | Admitting: Family Medicine

## 2016-05-23 VITALS — BP 129/88 | HR 91 | Temp 98.3°F | Ht 68.0 in | Wt 170.6 lb

## 2016-05-23 DIAGNOSIS — Z309 Encounter for contraceptive management, unspecified: Secondary | ICD-10-CM | POA: Diagnosis not present

## 2016-05-23 DIAGNOSIS — Z113 Encounter for screening for infections with a predominantly sexual mode of transmission: Secondary | ICD-10-CM

## 2016-05-23 DIAGNOSIS — H5213 Myopia, bilateral: Secondary | ICD-10-CM | POA: Diagnosis not present

## 2016-05-23 DIAGNOSIS — R197 Diarrhea, unspecified: Secondary | ICD-10-CM

## 2016-05-23 DIAGNOSIS — H40013 Open angle with borderline findings, low risk, bilateral: Secondary | ICD-10-CM | POA: Diagnosis not present

## 2016-05-23 DIAGNOSIS — G43009 Migraine without aura, not intractable, without status migrainosus: Secondary | ICD-10-CM | POA: Diagnosis not present

## 2016-05-23 DIAGNOSIS — E785 Hyperlipidemia, unspecified: Secondary | ICD-10-CM

## 2016-05-23 MED ORDER — DIPHENOXYLATE-ATROPINE 2.5-0.025 MG PO TABS: 1.0000 | ORAL_TABLET | Freq: Four times a day (QID) | ORAL | 1 refills | Status: DC | PRN

## 2016-05-23 MED ORDER — MEDROXYPROGESTERONE ACETATE 150 MG/ML IM SUSP
150.0000 mg | Freq: Once | INTRAMUSCULAR | Status: AC
  Administered 2016-05-23: 150 mg via INTRAMUSCULAR

## 2016-05-23 MED ORDER — SUMATRIPTAN SUCCINATE 50 MG PO TABS
50.0000 mg | ORAL_TABLET | ORAL | 3 refills | Status: DC | PRN
Start: 2016-05-23 — End: 2019-09-01

## 2016-05-23 NOTE — Patient Instructions (Signed)
It was good to see you today- I'll be in touch with your labs asap Let's see each other in about 6 months for labs- we are glad to do your depo shot in 3 months as well

## 2016-05-23 NOTE — Progress Notes (Signed)
Pre visit review using our clinic review tool, if applicable. No additional management support is needed unless otherwise documented below in the visit note. 

## 2016-05-23 NOTE — Progress Notes (Signed)
[facility] [address] [place], [place] [address] [phone]  Date:  05/23/2016   Name:  Lisa [name]   DOB:  [birth year]   MRN:  [id number]  PCP:  Abbe Amsterdam, MD    Chief Complaint: Follow-up (Pt here for med f/u. Would like to get depo provera injection today. )   History of Present Illness:  Lisa [name] is a 45 y.o. very pleasant female patient who presents with the following:  Here today for a medication check.  She got contact lenses today- she likes them so far.  She had had a couple of new partners since out last visit- she is currently dating one person but is also talking to another person. She is enjoying dating but would like to do an STI screening today She does not have any concerning symptoms  Last depo was given at [facility] 02/27/16- she is in her window and may have the depo today She does take lomotil as needed for diarrhea and needs a refill today  Her daughter just turned 26 years old- there is some tension as she is growing up and becoming more rebellious, but overall they are doing ok  Patient Active Problem List   Diagnosis Date Noted  . History of abnormal cervical Pap smear 09/21/2013  . Leukocytopenia, unspecified 03/02/2013  . S/P hip replacement 02/06/2013  . Hyperlipidemia 10/13/2012  . HTN (hypertension) 10/13/2012    Past Medical History:  Diagnosis Date  . Anemia    hx of  . Anxiety 20 years ago  . Depression 20 years ago  . GERD (gastroesophageal reflux disease)   . Heart murmur   . Hyperlipidemia   . Hypertension   . Ulcer (HCC)    x2    Past Surgical History:  Procedure Laterality Date  . ESOPHAGOGASTRODUODENOSCOPY ENDOSCOPY     at least 4  . LAPAROSCOPIC APPENDECTOMY N/A 02/23/2015   Procedure: APPENDECTOMY LAPAROSCOPIC;  Surgeon: Gaynelle Adu, MD;  Location: [facility];  Service: General;  Laterality: N/A;  . NO PAST SURGERIES    . TOTAL HIP ARTHROPLASTY Left  02/06/2013   Procedure: LEFT TOTAL HIP ARTHROPLASTY ANTERIOR APPROACH;  Surgeon: Kathryne Hitch, MD;  Location: [facility];  Service: Orthopedics;  Laterality: Left;    Social History  Substance Use Topics  . Smoking status: Former Smoker    Packs/day: 0.25    Years: 20.00    Types: Cigarettes    Quit date: 08/13/2013  . Smokeless tobacco: Never Used  . Alcohol use 0.0 oz/week     Comment: few glasses of wine a week    Family History  Problem Relation Age of Onset  . Adopted: Yes    Allergies  Allergen Reactions  . Nsaids Other (See Comments)    ulcers    Medication list has been reviewed and updated.  Current Outpatient Prescriptions on File Prior to Visit  Medication Sig Dispense Refill  . acetaminophen (TYLENOL) 500 MG tablet Take 1,000 mg by mouth every 6 (six) hours as needed for headache.    . diphenoxylate-atropine (LOMOTIL) 2.5-0.025 MG tablet Take 1 tablet by mouth 4 (four) times daily as needed for diarrhea or loose stools. 60 tablet 1  . HYDROcodone-acetaminophen (NORCO/VICODIN) 5-325 MG tablet Take 1 tablet by mouth every 6 (six) hours as needed. 30 tablet 0  . methocarbamol (ROBAXIN-750) 750 MG tablet Take 1 tablet (750 mg total) by mouth every 8 (eight) hours  as needed for muscle spasms. 30 tablet 0  . ondansetron (ZOFRAN-ODT) 8 MG disintegrating tablet Take 1 tablet (8 mg total) by mouth every 8 (eight) hours as needed for nausea. 12 tablet 0  . OVER THE COUNTER MEDICATION Take 1 tablet by mouth at bedtime as needed (sleep). [employer] Brand Sleep aid as needed    . pantoprazole (PROTONIX) 40 MG tablet Take 40 mg by mouth every morning.    . simvastatin (ZOCOR) 20 MG tablet Take 1 tablet (20 mg total) by mouth every evening. 90 tablet 2  . SUMAtriptan (IMITREX) 50 MG tablet Take 1 tablet (50 mg total) by mouth every 2 (two) hours as needed for migraine. Max 100 mg in 24 hours 10 tablet 0  . traZODone (DESYREL) 50 MG tablet Take 1 to 2 tablets (50- 100mg ) at bedtime as  needed for sleep 180 tablet 2   No current facility-administered medications on file prior to visit.     Review of Systems:  As per HPI- otherwise negative. No fever, chills, nausea, vomiting  BP Readings from Last 3 Encounters:  05/23/16 129/88  11/07/15 112/76  10/26/15 126/78   Wt Readings from Last 3 Encounters:  05/23/16 170 lb 9.6 oz (77.4 kg)  11/07/15 175 lb 12.8 oz (79.7 kg)  10/26/15 175 lb (79.4 kg)      Physical Examination: Vitals:   05/23/16 1630  BP: 129/88  Pulse: 91  Temp: 98.3 F (36.8 C)   Vitals:   05/23/16 1630  Weight: 170 lb 9.6 oz (77.4 kg)  Height: 5\' 8"  (1.727 m)   Body mass index is 25.94 kg/m. Ideal Body Weight: Weight in (lb) to have BMI = 25: 164.1  GEN: WDWN, NAD, Non-toxic, A & O x 3, looks well HEENT: Atraumatic, Normocephalic. Neck supple. No masses, No LAD. Ears and Nose: No external deformity. CV: RRR, No M/G/R. No JVD. No thrill. No extra heart sounds. PULM: CTA B, no wheezes, crackles, rhonchi. No retractions. No resp. distress. No accessory muscle use. ABD: S, NT, ND EXTR: No c/c/e NEURO Normal gait.  PSYCH: Normally interactive. Conversant. Not depressed or anxious appearing.  Calm demeanor.    Assessment and Plan: Routine screening for STI (sexually transmitted infection) - Plan: Hepatitis B surface antibody, Hepatitis B surface antigen, Hepatitis C antibody, RPR, HIV antibody, GC/Chlamydia Probe Amp  Migraine without aura and without status migrainosus, not intractable - Plan: SUMAtriptan (IMITREX) 50 MG tablet  Hyperlipidemia, unspecified hyperlipidemia type  Diarrhea, unspecified type STI screening, depo shot, refills as above Will plan further follow- up pending labs. Not fasting today so did not check her lipids, will do next time   Signed Abbe AmsterdamJessica Keaton Stirewalt, MD

## 2016-05-24 LAB — HEPATITIS C ANTIBODY: HCV Ab: NEGATIVE

## 2016-05-24 LAB — RPR

## 2016-05-24 LAB — HEPATITIS B SURFACE ANTIBODY, QUANTITATIVE

## 2016-05-24 LAB — HIV ANTIBODY (ROUTINE TESTING W REFLEX): HIV: NONREACTIVE

## 2016-05-24 LAB — GC/CHLAMYDIA PROBE AMP
CT Probe RNA: NOT DETECTED
GC Probe RNA: NOT DETECTED

## 2016-05-24 LAB — HEPATITIS B SURFACE ANTIGEN: HEP B S AG: NEGATIVE

## 2016-07-21 ENCOUNTER — Emergency Department (HOSPITAL_COMMUNITY)
Admission: EM | Admit: 2016-07-21 | Discharge: 2016-07-21 | Disposition: A | Discharge status: Home / Self Care | Payer: BLUE CROSS/BLUE SHIELD | Attending: Emergency Medicine | Admitting: Emergency Medicine

## 2016-07-21 ENCOUNTER — Emergency Department (HOSPITAL_COMMUNITY): Payer: BLUE CROSS/BLUE SHIELD

## 2016-07-21 ENCOUNTER — Encounter (HOSPITAL_COMMUNITY): Payer: Self-pay | Admitting: *Deleted

## 2016-07-21 DIAGNOSIS — M7989 Other specified soft tissue disorders: Secondary | ICD-10-CM | POA: Diagnosis not present

## 2016-07-21 DIAGNOSIS — S83402A Sprain of unspecified collateral ligament of left knee, initial encounter: Secondary | ICD-10-CM | POA: Insufficient documentation

## 2016-07-21 DIAGNOSIS — Y939 Activity, unspecified: Secondary | ICD-10-CM | POA: Diagnosis not present

## 2016-07-21 DIAGNOSIS — W010XXA Fall on same level from slipping, tripping and stumbling without subsequent striking against object, initial encounter: Secondary | ICD-10-CM | POA: Insufficient documentation

## 2016-07-21 DIAGNOSIS — I10 Essential (primary) hypertension: Secondary | ICD-10-CM | POA: Insufficient documentation

## 2016-07-21 DIAGNOSIS — Y929 Unspecified place or not applicable: Secondary | ICD-10-CM | POA: Diagnosis not present

## 2016-07-21 DIAGNOSIS — Z87891 Personal history of nicotine dependence: Secondary | ICD-10-CM | POA: Insufficient documentation

## 2016-07-21 DIAGNOSIS — R229 Localized swelling, mass and lump, unspecified: Secondary | ICD-10-CM | POA: Diagnosis not present

## 2016-07-21 DIAGNOSIS — T148XXA Other injury of unspecified body region, initial encounter: Secondary | ICD-10-CM | POA: Diagnosis not present

## 2016-07-21 DIAGNOSIS — Y999 Unspecified external cause status: Secondary | ICD-10-CM | POA: Diagnosis not present

## 2016-07-21 DIAGNOSIS — Z96642 Presence of left artificial hip joint: Secondary | ICD-10-CM | POA: Diagnosis not present

## 2016-07-21 DIAGNOSIS — S8992XA Unspecified injury of left lower leg, initial encounter: Secondary | ICD-10-CM | POA: Diagnosis present

## 2016-07-21 DIAGNOSIS — M545 Low back pain, unspecified: Secondary | ICD-10-CM

## 2016-07-21 DIAGNOSIS — M62830 Muscle spasm of back: Secondary | ICD-10-CM

## 2016-07-21 DIAGNOSIS — M25562 Pain in left knee: Secondary | ICD-10-CM | POA: Diagnosis not present

## 2016-07-21 MED ORDER — HYDROCODONE-ACETAMINOPHEN 5-325 MG PO TABS
1.0000 | ORAL_TABLET | Freq: Once | ORAL | Status: AC
  Administered 2016-07-21: 1 via ORAL
  Filled 2016-07-21: qty 1

## 2016-07-21 MED ORDER — HYDROCODONE-ACETAMINOPHEN 5-325 MG PO TABS: 1.0000 | ORAL_TABLET | Freq: Four times a day (QID) | ORAL | 0 refills | Status: DC | PRN

## 2016-07-21 MED ORDER — CYCLOBENZAPRINE HCL 10 MG PO TABS: 10.0000 mg | ORAL_TABLET | Freq: Two times a day (BID) | ORAL | 0 refills | Status: DC | PRN

## 2016-07-21 NOTE — ED Triage Notes (Signed)
Per EMS, pt fell on hard wood floor at a party today. Pt states she turned and fell, injuring her left knee. Pt unable to bear weight on left leg. Pt has swelling in left knee.

## 2016-07-21 NOTE — ED Provider Notes (Signed)
[facility]-EMERGENCY DEPT Provider Note   CSN: [id number] Arrival date & time: 07/21/16  1700   By signing my name below, I, Teofilo Pod, attest that this documentation has been prepared under the direction and in the presence of Fayrene Helper, PA-C. Electronically Signed: Teofilo Pod, ED Scribe. 07/21/2016. 6:41 PM.   History   Chief Complaint Chief Complaint  Patient presents with  . Knee Injury    The history is provided by the patient. No language interpreter was used.   HPI Comments:  Lisa [name] is a 45 y.o. female who presents to the Emergency Department complaining of constant left knee pain due to an injury today. Pt reports that she slipped on a wood floor and her left knee twisted, causing her to fall. Pt states that she is unable to bear weight or walk on the left leg. Pt notes swelling to her left knee. Pt rates the pain at 1 or 2/10 at rest, and notes that she did not hear a crack or pop from the knee. Pt states that she was drinking wine at the party where she fell. Pt did not hit her head. Pt denies previous similar injuries. Pt notes an allergy to NSAIDs. No alleviating factors noted. Pt denies pain to her ankle or hip, and denies lightheadedness, dizziness, chest pain.  Past Medical History:  Diagnosis Date  . Anemia    hx of  . Anxiety 20 years ago  . Depression 20 years ago  . GERD (gastroesophageal reflux disease)   . Heart murmur   . Hyperlipidemia   . Hypertension   . Ulcer [facility])    x2    Patient Active Problem List   Diagnosis Date Noted  . History of abnormal cervical Pap smear 09/21/2013  . Leukocytopenia, unspecified 03/02/2013  . S/P hip replacement 02/06/2013  . Hyperlipidemia 10/13/2012  . HTN (hypertension) 10/13/2012    Past Surgical History:  Procedure Laterality Date  . ESOPHAGOGASTRODUODENOSCOPY ENDOSCOPY     at least 4  . LAPAROSCOPIC APPENDECTOMY N/A 02/23/2015   Procedure: APPENDECTOMY LAPAROSCOPIC;  Surgeon: Gaynelle Adu,  MD;  Location: [facility];  Service: General;  Laterality: N/A;  . NO PAST SURGERIES    . TOTAL HIP ARTHROPLASTY Left 02/06/2013   Procedure: LEFT TOTAL HIP ARTHROPLASTY ANTERIOR APPROACH;  Surgeon: Kathryne Hitch, MD;  Location: [facility];  Service: Orthopedics;  Laterality: Left;    OB History    No data available       Home Medications    Prior to Admission medications   Medication Sig Start Date End Date Taking? Authorizing Provider  acetaminophen (TYLENOL) 500 MG tablet Take 1,000 mg by mouth every 6 (six) hours as needed for headache.    Historical Provider, MD  diphenoxylate-atropine (LOMOTIL) 2.5-0.025 MG tablet Take 1 tablet by mouth 4 (four) times daily as needed for diarrhea or loose stools. 05/23/16   Pearline Cables, MD  HYDROcodone-acetaminophen (NORCO/VICODIN) 5-325 MG tablet Take 1 tablet by mouth every 6 (six) hours as needed. 06/28/15   Jonita Albee, MD  methocarbamol (ROBAXIN-750) 750 MG tablet Take 1 tablet (750 mg total) by mouth every 8 (eight) hours as needed for muscle spasms. 06/28/15   Jonita Albee, MD  ondansetron (ZOFRAN-ODT) 8 MG disintegrating tablet Take 1 tablet (8 mg total) by mouth every 8 (eight) hours as needed for nausea. 10/26/15   Elvina Sidle, MD  OVER THE COUNTER MEDICATION Take 1 tablet by mouth at bedtime as needed (sleep).  [employer] Brand Sleep aid as needed    Historical Provider, MD  pantoprazole (PROTONIX) 40 MG tablet Take 40 mg by mouth every morning.    Historical Provider, MD  simvastatin (ZOCOR) 20 MG tablet Take 1 tablet (20 mg total) by mouth every evening. 04/13/16   Gwenlyn FoundJessica C Copland, MD  SUMAtriptan (IMITREX) 50 MG tablet Take 1 tablet (50 mg total) by mouth every 2 (two) hours as needed for migraine. Max 100 mg in 24 hours 05/23/16   Pearline CablesJessica C Copland, MD  traZODone (DESYREL) 50 MG tablet Take 1 to 2 tablets (50- 100mg ) at bedtime as needed for sleep 04/13/16   Pearline CablesJessica C Copland, MD    Family History Family History  Problem Relation  Age of Onset  . Adopted: Yes    Social History Social History  Substance Use Topics  . Smoking status: Former Smoker    Packs/day: 0.25    Years: 20.00    Types: Cigarettes    Quit date: 08/13/2013  . Smokeless tobacco: Never Used  . Alcohol use 0.0 oz/week     Comment: few glasses of wine a week     Allergies   Nsaids   Review of Systems Review of Systems  Cardiovascular: Negative for chest pain.  Musculoskeletal: Positive for arthralgias and joint swelling.  Neurological: Negative for dizziness and light-headedness.     Physical Exam Updated Vital Signs BP 108/80 (BP Location: Left Arm)   Pulse 114   Temp 98.2 F (36.8 C) (Oral)   Resp 20   SpO2 98%   Physical Exam  Constitutional: She appears well-developed and well-nourished. No distress.  HENT:  Head: Normocephalic and atraumatic.  Eyes: Conjunctivae are normal.  Cardiovascular: Normal rate.   Pulmonary/Chest: Effort normal.  Abdominal: She exhibits no distension.  Musculoskeletal:       Left knee: She exhibits effusion. Tenderness found. Lateral joint line and patellar tendon (inferior anterior) tenderness noted.  Increasing pain with flexion. Evidence of posterior fossa Baker's cyst. Pain with varus and valgus maneuver.   Neurological: She is alert.  Skin: Skin is warm and dry.  Psychiatric: She has a normal mood and affect.  Nursing note and vitals reviewed.    ED Treatments / Results  DIAGNOSTIC STUDIES:  Oxygen Saturation is 98% on RA, normal by my interpretation.    COORDINATION OF CARE:  6:41 PM Discussed treatment plan with pt at bedside and pt agreed to plan.   Labs (all labs ordered are listed, but only abnormal results are displayed) Labs Reviewed - No data to display  EKG  EKG Interpretation None       Radiology Dg Knee Complete 4 Views Left  Result Date: 07/21/2016 CLINICAL DATA:  Larey SeatFell and twisted knee at home today, cannot bend LEFT knee since incident, LEFT knee pain  and swelling EXAM: LEFT KNEE - COMPLETE 4+ VIEW COMPARISON:  None FINDINGS: Osseous demineralization. Medial compartment joint space narrowing. No acute fracture, dislocation, or bone destruction. No definite knee joint effusion. IMPRESSION: Osseous demineralization with mild degenerative changes at medial compartment LEFT knee. No acute osseous abnormalities. Electronically Signed   By: Ulyses SouthwardMark  Boles M.D.   On: 07/21/2016 18:00    Procedures Procedures (including critical care time)  Medications Ordered in ED Medications - No data to display   Initial Impression / Assessment and Plan / ED Course  I have reviewed the triage vital signs and the nursing notes.  Pertinent labs & imaging results that were available during my care of  the patient were reviewed by me and considered in my medical decision making (see chart for details).  Clinical Course     BP 106/78 (BP Location: Right Arm)   Pulse 100   Temp 98.2 F (36.8 C) (Oral)   Resp 18   SpO2 100%    Final Clinical Impressions(s) / ED Diagnoses   Final diagnoses:  Sprain of collateral ligament of left knee, initial encounter    New Prescriptions Discharge Medication List as of 07/21/2016  6:56 PM    START taking these medications   Details  cyclobenzaprine (FLEXERIL) 10 MG tablet Take 1 tablet (10 mg total) by mouth 2 (two) times daily as needed for muscle spasms., Starting Sat 07/21/2016, Print      I personally performed the services described in this documentation, which was scribed in my presence. The recorded information has been reviewed and is accurate.   7:37 PM Pt injured L knee.  Xray neg for acute fx/dislocation.  Finding suggestive of L knee sprain.  Moderate swelling noted.  Suspect meniscal injury.  Pt placed in knee immobilizer and crutches. Ortho referral given as needed.      Fayrene HelperBowie Mishawn Didion, PA-C 07/21/16 1939    Melene Planan Floyd, DO 07/22/16 0005

## 2016-07-26 ENCOUNTER — Ambulatory Visit
Admission: RE | Admit: 2016-07-26 | Discharge: 2016-07-26 | Disposition: A | Discharge status: Home / Self Care | Payer: BLUE CROSS/BLUE SHIELD | Source: Ambulatory Visit | Attending: Orthopedic Surgery | Admitting: Orthopedic Surgery

## 2016-07-26 ENCOUNTER — Encounter (INDEPENDENT_AMBULATORY_CARE_PROVIDER_SITE_OTHER): Payer: Self-pay | Admitting: Orthopedic Surgery

## 2016-07-26 ENCOUNTER — Ambulatory Visit (INDEPENDENT_AMBULATORY_CARE_PROVIDER_SITE_OTHER): Payer: BLUE CROSS/BLUE SHIELD | Admitting: Orthopedic Surgery

## 2016-07-26 DIAGNOSIS — M25562 Pain in left knee: Secondary | ICD-10-CM | POA: Diagnosis not present

## 2016-07-26 DIAGNOSIS — S83512A Sprain of anterior cruciate ligament of left knee, initial encounter: Secondary | ICD-10-CM | POA: Diagnosis not present

## 2016-07-26 DIAGNOSIS — S83522A Sprain of posterior cruciate ligament of left knee, initial encounter: Secondary | ICD-10-CM | POA: Diagnosis not present

## 2016-07-26 NOTE — Progress Notes (Signed)
Office Visit Note   Patient: Lisa [name]           Date of Birth: 02/05/1971           MRN: [id number] Visit Date: 07/26/2016 Requested by: Pearline CablesJessica C Copland, MD [address] [place], [place] [id number] PCP: Abbe AmsterdamOPLAND,JESSICA, MD  Subjective: Chief Complaint  Patient presents with  . Left Knee - Injury, Pain    HPI Lisa [name] is a 45 year old patient with left knee pain.  She had an injury 07/21/2016.  This was some type of twisting flexion injury.  It's affected her left knee which is the same side as her total hip replacement.  She does part-time work at [employer] currently.  She has been trying to get a work-related been difficult.  She reports that her knee is unstable and she has been walking with a knee immobilizer.  No history of DVT or pulmonary embolism.              Review of Systems All systems reviewed are negative as they relate to the chief complaint within the history of present illness.  Patient denies  fevers or chills.   Assessment & Plan: Visit Diagnoses:  1. Acute pain of left knee   2. Rupture of anterior cruciate ligament of left knee, initial encounter     Plan: Impression is multilevel ligament left knee injury with evidence on exam which is somewhat guarded of MCL injury and anterior cruciate ligament injury.  She is MRI scan to evaluate for proximal MCL detachment as well as anterior cruciate ligament tear and possible meniscal tear.  Okay to weight-bear as tolerated in the knee immobilizer working on knee flexion out of the knee immobilizer.  She does have full knee extension.  Plain radiographs from outside are normal with no fracture.  She does have a little bit of mild medial joint space narrowing.  I'll see her back after her study  Follow-Up Instructions: Return for after MRI.   Orders:  Orders Placed This Encounter  Procedures  . MR Knee Left w/o contrast   No orders of the defined types were placed in this  encounter.     Procedures: No procedures performed   Clinical Data: No additional findings.  Objective: Vital Signs: There were no vitals taken for this visit.  Physical Exam   Constitutional: Patient appears well-developed HEENT:  Head: Normocephalic Eyes:EOM are normal Neck: Normal range of motion Cardiovascular: Normal rate Pulmonary/chest: Effort normal Neurologic: Patient is alert Skin: Skin is warm Psychiatric: Patient has normal mood and affect     Ortho Exam examination the left knee demonstrates a little bit of bruising medially and laterally around the tibial plateau.  Extensor mechanism is intact.  No groin pain with internal/external rotation of the leg.  Ankle dorsiflexion plantar flexion is intact.  Pedal pulses intact.  She has discrete tenderness over the medial epicondyles.  She has laxity to valgus stress at 0 and 30.  PCL is intact anterior cruciate ligament feels unstable left versus right.  She has some joint line tenderness laterally and medially  Specialty Comments:  No specialty comments available.  Imaging: No results found.   PMFS History: Patient Active Problem List   Diagnosis Date Noted  . Acute pain of left knee 07/26/2016  . History of abnormal cervical Pap smear 09/21/2013  . Leukocytopenia, unspecified 03/02/2013  . S/P hip replacement 02/06/2013  . Hyperlipidemia 10/13/2012  . HTN (hypertension) 10/13/2012  Past Medical History:  Diagnosis Date  . Anemia    hx of  . Anxiety 20 years ago  . Depression 20 years ago  . GERD (gastroesophageal reflux disease)   . Heart murmur   . Hyperlipidemia   . Hypertension   . Ulcer (HCC)    x2    Family History  Problem Relation Age of Onset  . Adopted: Yes    Past Surgical History:  Procedure Laterality Date  . ESOPHAGOGASTRODUODENOSCOPY ENDOSCOPY     at least 4  . LAPAROSCOPIC APPENDECTOMY N/A 02/23/2015   Procedure: APPENDECTOMY LAPAROSCOPIC;  Surgeon: Gaynelle AduEric Wilson, MD;   Location: [facility];  Service: General;  Laterality: N/A;  . NO PAST SURGERIES    . TOTAL HIP ARTHROPLASTY Left 02/06/2013   Procedure: LEFT TOTAL HIP ARTHROPLASTY ANTERIOR APPROACH;  Surgeon: Kathryne Hitchhristopher Y Blackman, MD;  Location: [facility];  Service: Orthopedics;  Laterality: Left;   Social History   Occupational History  . Not on file.   Social History Main Topics  . Smoking status: Former Smoker    Packs/day: 0.25    Years: 20.00    Types: Cigarettes    Quit date: 08/13/2013  . Smokeless tobacco: Never Used  . Alcohol use 0.0 oz/week     Comment: few glasses of wine a week  . Drug use: No  . Sexual activity: No

## 2016-08-03 ENCOUNTER — Ambulatory Visit (INDEPENDENT_AMBULATORY_CARE_PROVIDER_SITE_OTHER): Payer: BLUE CROSS/BLUE SHIELD | Admitting: Orthopedic Surgery

## 2016-08-03 ENCOUNTER — Encounter (INDEPENDENT_AMBULATORY_CARE_PROVIDER_SITE_OTHER): Payer: Self-pay | Admitting: Orthopedic Surgery

## 2016-08-03 DIAGNOSIS — S83282A Other tear of lateral meniscus, current injury, left knee, initial encounter: Secondary | ICD-10-CM | POA: Diagnosis not present

## 2016-08-03 DIAGNOSIS — S83512A Sprain of anterior cruciate ligament of left knee, initial encounter: Secondary | ICD-10-CM

## 2016-08-03 DIAGNOSIS — S83242A Other tear of medial meniscus, current injury, left knee, initial encounter: Secondary | ICD-10-CM

## 2016-08-03 MED ORDER — METHOCARBAMOL 500 MG PO TABS: 500.0000 mg | ORAL_TABLET | Freq: Three times a day (TID) | ORAL | 0 refills | Status: DC | PRN

## 2016-08-03 MED ORDER — HYDROCODONE-ACETAMINOPHEN 5-325 MG PO TABS: 1.0000 | ORAL_TABLET | Freq: Four times a day (QID) | ORAL | 0 refills | Status: DC | PRN

## 2016-08-03 NOTE — Progress Notes (Signed)
Office Visit Note   Patient: Lisa [name]           Date of Birth: 05/08/1971           MRN: [id number] Visit Date: 08/03/2016 Requested by: Pearline CablesJessica C Copland, MD [address] [place], [place] [id number] PCP: Abbe AmsterdamOPLAND,JESSICA, MD  Subjective: Chief Complaint  Patient presents with  . Left Knee - Pain, Follow-up    HPI Lisa BattenKim is a 45 year old patient with noncontact left knee injury.  Injury happened 07/21/2016.  Since of [place] she's had an MRI scan which is reviewed.  She's been a relating with the knee immobilizer.  Knee feels unstable without the knee immobilizer.  No family history of pulmonary embolism or DVT.              Review of Systems All systems reviewed are negative as they relate to the chief complaint within the history of present illness.  Patient denies  fevers or chills.    Assessment & Plan: Visit Diagnoses:  1. Rupture of anterior cruciate ligament of left knee, initial encounter   2. Acute medial meniscus tear of left knee, initial encounter   3. Acute lateral meniscus tear of left knee, initial encounter     Plan: Impression is multilevel ligament left knee injury with about 80 of flexion currently.  Patient will need knee stabilization surgery.  The real question at this time is whether or not that includes PCL reconstruction versus repair.  3 she will need anterior cruciate ligament reconstruction.  Her medial collateral ligament also feels more unstable in extension left versus right.  Although was a partial tear on MRI scan it feels a little bit more unstable on exam.  Meniscal pathology is also present.  I would like for her to achieve a little bit more range of motion with flexion past 90 before we embark on surgical fixation.  I'll see her back in 2 weeks for clinical recheck and decide then what the best course will be.  Continue current duty at work [employer] refill  Follow-Up Instructions: No Follow-up on file.   Orders:  No orders  of the defined types were placed in this encounter.  No orders of the defined types were placed in this encounter.     Procedures: No procedures performed   Clinical Data: No additional findings.  Objective: Vital Signs: There were no vitals taken for this visit.  Physical Exam Constitutional: Patient appears well-developed HEENT:  Head: Normocephalic Eyes:EOM are normal Neck: Normal range of motion Cardiovascular: Normal rate Pulmonary/chest: Effort normal Neurologic: Patient is alert Skin: Skin is warm Psychiatric: Patient has normal mood and affect       Ortho Exam examination of the left knee demonstrates palpable pedal pulses.  Left 5 more hyperextension on the left compared to the right.  There is no posterior lateral rotatory instability on the left knee versus the right.  MCL does have more laxity to varus stress testing at both 0 and 30 left versus right.  Anterior cruciate ligament is out.  PCL is grade 1 on the left versus the right.  Patella is stable and extensor mechanism is intact      Specialty Comments:  No specialty comments available.  Imaging: No results found.   PMFS History: Patient Active Problem List   Diagnosis Date Noted  . Left ACL tear 08/03/2016  . Acute medial meniscus tear of left knee 08/03/2016  . Acute lateral meniscus tear  of left knee 08/03/2016  . Acute pain of left knee 07/26/2016  . History of abnormal cervical Pap smear 09/21/2013  . Leukocytopenia, unspecified 03/02/2013  . S/P hip replacement 02/06/2013  . Hyperlipidemia 10/13/2012  . HTN (hypertension) 10/13/2012   Past Medical History:  Diagnosis Date  . Anemia    hx of  . Anxiety 20 years ago  . Depression 20 years ago  . GERD (gastroesophageal reflux disease)   . Heart murmur   . Hyperlipidemia   . Hypertension   . Ulcer (HCC)    x2    Family History  Problem Relation Age of Onset  . Adopted: Yes    Past Surgical History:  Procedure Laterality  Date  . ESOPHAGOGASTRODUODENOSCOPY ENDOSCOPY     at least 4  . LAPAROSCOPIC APPENDECTOMY N/A 02/23/2015   Procedure: APPENDECTOMY LAPAROSCOPIC;  Surgeon: Gaynelle AduEric Wilson, MD;  Location: [facility];  Service: General;  Laterality: N/A;  . NO PAST SURGERIES    . TOTAL HIP ARTHROPLASTY Left 02/06/2013   Procedure: LEFT TOTAL HIP ARTHROPLASTY ANTERIOR APPROACH;  Surgeon: Kathryne Hitchhristopher Y Blackman, MD;  Location: [facility];  Service: Orthopedics;  Laterality: Left;   Social History   Occupational History  . Not on file.   Social History Main Topics  . Smoking status: Former Smoker    Packs/day: 0.25    Years: 20.00    Types: Cigarettes    Quit date: 08/13/2013  . Smokeless tobacco: Never Used  . Alcohol use 0.0 oz/week     Comment: few glasses of wine a week  . Drug use: No  . Sexual activity: No

## 2016-08-20 ENCOUNTER — Encounter (INDEPENDENT_AMBULATORY_CARE_PROVIDER_SITE_OTHER): Payer: Self-pay | Admitting: Orthopedic Surgery

## 2016-08-20 ENCOUNTER — Ambulatory Visit (INDEPENDENT_AMBULATORY_CARE_PROVIDER_SITE_OTHER): Payer: BLUE CROSS/BLUE SHIELD | Admitting: Orthopedic Surgery

## 2016-08-20 ENCOUNTER — Telehealth (INDEPENDENT_AMBULATORY_CARE_PROVIDER_SITE_OTHER): Payer: Self-pay | Admitting: *Deleted

## 2016-08-20 ENCOUNTER — Other Ambulatory Visit (INDEPENDENT_AMBULATORY_CARE_PROVIDER_SITE_OTHER): Payer: Self-pay | Admitting: Orthopedic Surgery

## 2016-08-20 DIAGNOSIS — M62830 Muscle spasm of back: Secondary | ICD-10-CM

## 2016-08-20 DIAGNOSIS — S83512D Sprain of anterior cruciate ligament of left knee, subsequent encounter: Secondary | ICD-10-CM

## 2016-08-20 DIAGNOSIS — S83512A Sprain of anterior cruciate ligament of left knee, initial encounter: Secondary | ICD-10-CM

## 2016-08-20 MED ORDER — HYDROCODONE-ACETAMINOPHEN 5-325 MG PO TABS: 1.0000 | ORAL_TABLET | Freq: Four times a day (QID) | ORAL | 0 refills | Status: DC | PRN

## 2016-08-20 MED ORDER — CYCLOBENZAPRINE HCL 10 MG PO TABS: 10.0000 mg | ORAL_TABLET | Freq: Two times a day (BID) | ORAL | 0 refills | Status: DC | PRN

## 2016-08-20 NOTE — Telephone Encounter (Signed)
Pt just left but forgot to get refill of RX hydrocodone and muscle relaxer.

## 2016-08-20 NOTE — Telephone Encounter (Signed)
Please advise on refills.  

## 2016-08-20 NOTE — Telephone Encounter (Signed)
Per Dr August Saucerean ok to refill both, Call pt tomorrow to pickup Rxs

## 2016-08-21 ENCOUNTER — Encounter: Payer: Self-pay | Admitting: Family Medicine

## 2016-08-21 NOTE — Telephone Encounter (Signed)
IC pt and advised Rx's ready to pickup.

## 2016-08-21 NOTE — Progress Notes (Signed)
Office Visit Note   Patient: Lisa [name]           Date of Birth: 05/21/1971           MRN: [id number] Visit Date: 08/20/2016 Requested by: Lisa CablesJessica C Copland, MD [address] [place], [place] [id number] PCP: Lisa AmsterdamOPLAND,JESSICA, MD  Subjective: Chief Complaint  Patient presents with  . Left Knee - Follow-up    HPI Lisa [name] is a 46 year old female with left knee multi-ligament injury.  Date of injury 07/21/2016.  Doing okay.  Uses sleeve and she's been walking around.  Does report some symptomatic instability at times.  She's taking Norco and Robaxin and working on a limited schedule.  She does have hip replacement on the left-hand side.  No family history or personal history of DVT or pulmonary embolism              Review of Systems All systems reviewed are negative as they relate to the chief complaint within the history of present illness.  Patient denies  fevers or chills.    Assessment & Plan: Visit Diagnoses:  1. Rupture of anterior cruciate ligament of left knee, subsequent encounter     Plan: Impression is left knee anterior cruciate ligament tear with some improvement and PCL laxity.  Medial side is still lacks.  Plan at this time is anterior cruciate ligament reconstruction using hamstring autograft.  MCL repair is indicated as well.  I don't think were going to have to address the PCL.  There does not appear to be posterior lateral rotatory instability at this time.  Risks and benefits discussed with the patient including not limited to infection or vessel damage knee stiffness extensive nature the rehabilitative process also discussed.  All questions answered.  Follow-Up Instructions: No Follow-up on file.   Orders:  No orders of the defined types were placed in this encounter.  No orders of the defined types were placed in this encounter.     Procedures: No procedures performed   Clinical Data: No additional findings.  Objective: Vital Signs: There  were no vitals taken for this visit.  Physical Exam   Constitutional: Patient appears well-developed HEENT:  Head: Normocephalic Eyes:EOM are normal Neck: Normal range of motion Cardiovascular: Normal rate Pulmonary/chest: Effort normal Neurologic: Patient is alert Skin: Skin is warm Psychiatric: Patient has normal mood and affect    Ortho Exam examination of the left knee demonstrates anterior cruciate ligament laxity continued MCL laxity at both 0 and 30 with about 3 and 5 millimeters of opening with valgus stress.  There is no opening to varus stress at 0 or 30.  No hyperextension recurvatum deformity.  Pedal pulses palpable range of motion is excellent only trace effusion is present Specialty Comments:  No specialty comments available.  Imaging: No results found.   PMFS History: Patient Active Problem List   Diagnosis Date Noted  . Left ACL tear 08/03/2016  . Acute medial meniscus tear of left knee 08/03/2016  . Acute lateral meniscus tear of left knee 08/03/2016  . Acute pain of left knee 07/26/2016  . History of abnormal cervical Pap smear 09/21/2013  . Leukocytopenia, unspecified 03/02/2013  . S/P hip replacement 02/06/2013  . Hyperlipidemia 10/13/2012  . HTN (hypertension) 10/13/2012   Past Medical History:  Diagnosis Date  . Anemia    hx of  . Anxiety 20 years ago  . Depression 20 years ago  . GERD (gastroesophageal reflux disease)   . Heart  murmur   . Hyperlipidemia   . Hypertension   . Ulcer (HCC)    x2    Family History  Problem Relation Age of Onset  . Adopted: Yes    Past Surgical History:  Procedure Laterality Date  . ESOPHAGOGASTRODUODENOSCOPY ENDOSCOPY     at least 4  . LAPAROSCOPIC APPENDECTOMY N/A 02/23/2015   Procedure: APPENDECTOMY LAPAROSCOPIC;  Surgeon: Gaynelle Adu, MD;  Location: [facility];  Service: General;  Laterality: N/A;  . NO PAST SURGERIES    . TOTAL HIP ARTHROPLASTY Left 02/06/2013   Procedure: LEFT TOTAL HIP ARTHROPLASTY  ANTERIOR APPROACH;  Surgeon: Kathryne Hitch, MD;  Location: [facility];  Service: Orthopedics;  Laterality: Left;   Social History   Occupational History  . Not on file.   Social History Main Topics  . Smoking status: Former Smoker    Packs/day: 0.25    Years: 20.00    Types: Cigarettes    Quit date: 08/13/2013  . Smokeless tobacco: Never Used  . Alcohol use 0.0 oz/week     Comment: few glasses of wine a week  . Drug use: No  . Sexual activity: No

## 2016-08-23 ENCOUNTER — Ambulatory Visit (INDEPENDENT_AMBULATORY_CARE_PROVIDER_SITE_OTHER): Payer: BLUE CROSS/BLUE SHIELD | Admitting: Physician Assistant

## 2016-08-23 DIAGNOSIS — Z309 Encounter for contraceptive management, unspecified: Secondary | ICD-10-CM

## 2016-08-23 MED ORDER — MEDROXYPROGESTERONE ACETATE 150 MG/ML IM SUSP
150.0000 mg | Freq: Once | INTRAMUSCULAR | Status: AC
  Administered 2016-08-23: 150 mg via INTRAMUSCULAR

## 2016-08-23 NOTE — Progress Notes (Signed)
  08/23/2016 11:23 AM   DOB: 03/01/1971 / MRN: [id number]  SUBJECTIVE:  Lisa [name] is a 46 y.o. female presenting for depo shot.  Most recent was documented by [place] at Dr. Cyndie Chimeopland's office on 05/23/2016.    ROS  The problem list and medications were reviewed and updated by myself where necessary and exist elsewhere in the encounter.   OBJECTIVE:   Physical Exam  Constitutional: She appears well-nourished. No distress.     ASSESSMENT AND PLAN:  Diagnoses and all orders for this visit:  Encounter for contraceptive management, unspecified type: No need for urine preg as she is within her window.  Order placed.  -     medroxyPROGESTERone (DEPO-PROVERA) injection 150 mg; Inject 1 mL (150 mg total) into the muscle once.    The patient is advised to call or return to clinic if she does not see an improvement in symptoms, or to seek the care of the closest emergency department if she worsens with the above plan.   Deliah BostonMichael Clark, MHS, PA-C [facility] 08/23/2016 11:23 AM

## 2016-08-29 ENCOUNTER — Encounter (HOSPITAL_COMMUNITY): Payer: Self-pay

## 2016-08-29 ENCOUNTER — Other Ambulatory Visit (HOSPITAL_COMMUNITY): Payer: Self-pay | Admitting: *Deleted

## 2016-08-29 ENCOUNTER — Inpatient Hospital Stay (HOSPITAL_COMMUNITY)
Admission: RE | Admit: 2016-08-29 | Discharge: 2016-08-29 | Disposition: A | Discharge status: Home / Self Care | Payer: BLUE CROSS/BLUE SHIELD | Source: Ambulatory Visit

## 2016-08-29 ENCOUNTER — Ambulatory Visit (INDEPENDENT_AMBULATORY_CARE_PROVIDER_SITE_OTHER): Payer: BLUE CROSS/BLUE SHIELD | Admitting: Orthopedic Surgery

## 2016-08-29 NOTE — Pre-Procedure Instructions (Signed)
    Dairl PonderKim M Brandner  08/29/2016     Your procedure is scheduled on Tuesday, September 04, 2016 at 1:30 PM.   [facility] Office at 11:30 AM.   Call this number if you have problems the morning of surgery: [phone]   Questions prior to day of surgery, please call [phone] between 8 & 4 PM.   Remember:  Do not eat food or drink liquids after midnight Monday, 09/03/16.  Take these medicines the morning of surgery with A SIP OF WATER: Pantoprazole (Protonix), Hydrocodone - if needed   Do not wear jewelry, make-up or nail polish.  Do not wear lotions, powders, or perfumes.  Do not shave 48 hours prior to surgery.    Do not bring valuables to the hospital.  [facility] not responsible for any belongings or valuables.  Contacts, dentures or bridgework may not be worn into surgery.  Leave your suitcase in the car.  After surgery it may be brought to your room.  For patients admitted to the hospital, discharge time will be determined by your treatment team.  Patients discharged the day of surgery will not be allowed to drive home.   Special instructions:  See "Preparing for Surgery" Instruction sheet.  Please read over the fact sheets that you were given.

## 2016-09-01 ENCOUNTER — Encounter (INDEPENDENT_AMBULATORY_CARE_PROVIDER_SITE_OTHER): Payer: Self-pay | Admitting: Orthopedic Surgery

## 2016-09-03 NOTE — Telephone Encounter (Signed)
I called patient and advised. She is going to come by here prior to surgery in the morning and pick up handicap placard at front desk and take to surgery center for Dr. August Saucerean to sigh.

## 2016-09-03 NOTE — Telephone Encounter (Signed)
Home probably by 8 o'clock and okay for handicap parking permit for 6 months please call thanks

## 2016-09-03 NOTE — H&P (Signed)
Lisa [name] is an 46 y.o. female.   Chief Complaint: Left knee pain HPI: Lisa [name] is a 46 year old patient who injured her left knee over 2 months ago.  She was evaluated and noted to have ligamentous laxity.  MRI scanning demonstrated anterior cruciate ligament tear with distal PCL injury as well as MCL injury and meniscal damage.  She has rehabilitated the left knee and the PCL has healed with much less laxity than at the time of presentation.  MCL still has laxity in the anterior cruciate ligament still has laxity.  No family history of DVT or pulmonary embolism.  Past Medical History:  Diagnosis Date  . Anemia    hx of  . Anxiety 20 years ago  . Depression 20 years ago  . GERD (gastroesophageal reflux disease)   . Heart murmur   . Hyperlipidemia   . Hypertension    history of htn, no meds now  . Ulcer (HCC)    x2    Past Surgical History:  Procedure Laterality Date  . ESOPHAGOGASTRODUODENOSCOPY ENDOSCOPY     at least 4  . LAPAROSCOPIC APPENDECTOMY N/A 02/23/2015   Procedure: APPENDECTOMY LAPAROSCOPIC;  Surgeon: Gaynelle Adu, MD;  Location: [facility];  Service: General;  Laterality: N/A;  . TOTAL HIP ARTHROPLASTY Left 02/06/2013   Procedure: LEFT TOTAL HIP ARTHROPLASTY ANTERIOR APPROACH;  Surgeon: Kathryne Hitch, MD;  Location: [facility];  Service: Orthopedics;  Laterality: Left;    Family History  Problem Relation Age of Onset  . Adopted: Yes   Social History:  reports that she quit smoking about 3 years ago. Her smoking use included Cigarettes. She has a 5.00 pack-year smoking history. She has never used smokeless tobacco. She reports that she drinks alcohol. She reports that she does not use drugs.  Allergies:  Allergies  Allergen Reactions  . Nsaids Other (See Comments)    ulcers    No prescriptions prior to admission.    No results found for this or any previous visit (from the past 48 hour(s)). No results found.  Review of Systems  Musculoskeletal: Positive for  joint pain.  All other systems reviewed and are negative.   There were no vitals taken for this visit. Physical Exam  Constitutional: She appears well-developed.  HENT:  Head: Normocephalic.  Neck: Normal range of motion.  Cardiovascular: Normal rate.   Respiratory: Effort normal.  Neurological: She is alert.  Skin: Skin is warm.  Psychiatric: She has a normal mood and affect.   Examination of the left knee demonstrates full extension but no hyperextension which is asymmetric compared to the left.  Patient does have MCL laxity on the left at both 0 and 30 of 3 and 6 mm compared to 1 and 3 mm.   .  Anterior cruciate ligament is out.  PCL has good endpoint but does have grade 1 laxity.  It has healed significantly since prior evaluation.  Pedal pulses intact.  Lateral collateral ligament intact.  There is no posterior lateral rotatory instability.  Ankle dorsiflexion is intact.  Assessment/Plan Impression is multilevel ligament knee injury with meniscal damage as well as anterior cruciate ligament tear MCL tear which has not really tighten up in 6 weeks.  Patient also had partial tearing of the PCL which has healed.  Plan is arthroscopy with MCL repair and retensioning along with anterior cruciate ligament reconstruction using hamstring autograft.  Meniscal repair versus resection also planned.  Risks and benefits discussed with the patient including not limited to  infection or vessel damage incomplete healing rehabilitative process which is extensive is also discussed with the patient.  We'll plan to use CPM machine postop.  All questions answered  Burnard BuntingG Scott Corene Resnick, MD 09/03/2016, 10:12 PM

## 2016-09-04 ENCOUNTER — Ambulatory Visit (HOSPITAL_COMMUNITY): Payer: BLUE CROSS/BLUE SHIELD | Admitting: Anesthesiology

## 2016-09-04 ENCOUNTER — Ambulatory Visit (HOSPITAL_COMMUNITY)
Admission: RE | Admit: 2016-09-04 | Discharge: 2016-09-04 | Disposition: A | Discharge status: Home / Self Care | Payer: BLUE CROSS/BLUE SHIELD | Source: Ambulatory Visit | Attending: Orthopedic Surgery | Admitting: Orthopedic Surgery

## 2016-09-04 ENCOUNTER — Encounter (HOSPITAL_COMMUNITY)
Admission: RE | Disposition: A | Discharge status: Home / Self Care | Payer: Self-pay | Source: Ambulatory Visit | Attending: Orthopedic Surgery

## 2016-09-04 ENCOUNTER — Encounter (HOSPITAL_COMMUNITY): Payer: Self-pay | Admitting: Urology

## 2016-09-04 DIAGNOSIS — K219 Gastro-esophageal reflux disease without esophagitis: Secondary | ICD-10-CM | POA: Diagnosis not present

## 2016-09-04 DIAGNOSIS — Z96642 Presence of left artificial hip joint: Secondary | ICD-10-CM | POA: Diagnosis not present

## 2016-09-04 DIAGNOSIS — Z87891 Personal history of nicotine dependence: Secondary | ICD-10-CM | POA: Diagnosis not present

## 2016-09-04 DIAGNOSIS — Z79899 Other long term (current) drug therapy: Secondary | ICD-10-CM | POA: Insufficient documentation

## 2016-09-04 DIAGNOSIS — S83242A Other tear of medial meniscus, current injury, left knee, initial encounter: Secondary | ICD-10-CM | POA: Diagnosis not present

## 2016-09-04 DIAGNOSIS — X58XXXA Exposure to other specified factors, initial encounter: Secondary | ICD-10-CM | POA: Diagnosis not present

## 2016-09-04 DIAGNOSIS — E785 Hyperlipidemia, unspecified: Secondary | ICD-10-CM | POA: Diagnosis not present

## 2016-09-04 DIAGNOSIS — S83412A Sprain of medial collateral ligament of left knee, initial encounter: Secondary | ICD-10-CM | POA: Insufficient documentation

## 2016-09-04 DIAGNOSIS — S83282A Other tear of lateral meniscus, current injury, left knee, initial encounter: Secondary | ICD-10-CM | POA: Diagnosis not present

## 2016-09-04 DIAGNOSIS — S83512A Sprain of anterior cruciate ligament of left knee, initial encounter: Secondary | ICD-10-CM | POA: Diagnosis not present

## 2016-09-04 DIAGNOSIS — I10 Essential (primary) hypertension: Secondary | ICD-10-CM | POA: Diagnosis not present

## 2016-09-04 DIAGNOSIS — G8918 Other acute postprocedural pain: Secondary | ICD-10-CM | POA: Diagnosis not present

## 2016-09-04 HISTORY — PX: ANTERIOR CRUCIATE LIGAMENT REPAIR: SHX115

## 2016-09-04 LAB — HCG, SERUM, QUALITATIVE: Preg, Serum: NEGATIVE

## 2016-09-04 SURGERY — RECONSTRUCTION, KNEE, ACL
Anesthesia: Regional | Site: Knee | Laterality: Left

## 2016-09-04 MED ORDER — DEXAMETHASONE SODIUM PHOSPHATE 10 MG/ML IJ SOLN
INTRAMUSCULAR | Status: AC
  Filled 2016-09-04: qty 3

## 2016-09-04 MED ORDER — LACTATED RINGERS IV SOLN
INTRAVENOUS | Status: DC
  Administered 2016-09-04 (×2): via INTRAVENOUS

## 2016-09-04 MED ORDER — FENTANYL CITRATE (PF) 100 MCG/2ML IJ SOLN
25.0000 ug | INTRAMUSCULAR | Status: DC | PRN
  Administered 2016-09-04 (×2): 50 ug via INTRAVENOUS

## 2016-09-04 MED ORDER — EPINEPHRINE PF 1 MG/ML IJ SOLN
INTRAMUSCULAR | Status: AC
  Filled 2016-09-04: qty 1

## 2016-09-04 MED ORDER — MIDAZOLAM HCL 5 MG/5ML IJ SOLN
INTRAMUSCULAR | Status: DC | PRN
  Administered 2016-09-04 (×2): 1 mg via INTRAVENOUS

## 2016-09-04 MED ORDER — CEFAZOLIN SODIUM-DEXTROSE 2-4 GM/100ML-% IV SOLN
INTRAVENOUS | Status: AC
  Filled 2016-09-04: qty 100

## 2016-09-04 MED ORDER — MORPHINE SULFATE (PF) 4 MG/ML IV SOLN
INTRAVENOUS | Status: DC | PRN
  Administered 2016-09-04: 4 mg

## 2016-09-04 MED ORDER — CEFAZOLIN SODIUM-DEXTROSE 2-4 GM/100ML-% IV SOLN
2.0000 g | INTRAVENOUS | Status: AC
  Administered 2016-09-04: 2 g via INTRAVENOUS

## 2016-09-04 MED ORDER — OXYCODONE HCL 5 MG PO TABS
ORAL_TABLET | ORAL | Status: AC
  Filled 2016-09-04: qty 1

## 2016-09-04 MED ORDER — DEXAMETHASONE SODIUM PHOSPHATE 10 MG/ML IJ SOLN
INTRAMUSCULAR | Status: DC | PRN
  Administered 2016-09-04: 10 mg via INTRAVENOUS

## 2016-09-04 MED ORDER — SODIUM CHLORIDE 0.9 % IR SOLN
Status: DC | PRN
  Administered 2016-09-04 (×2): 3000 mL
  Administered 2016-09-04: 2000 mL
  Administered 2016-09-04 (×2): 3000 mL

## 2016-09-04 MED ORDER — NEOSTIGMINE METHYLSULFATE 5 MG/5ML IV SOSY
PREFILLED_SYRINGE | INTRAVENOUS | Status: AC
  Filled 2016-09-04: qty 5

## 2016-09-04 MED ORDER — BUPIVACAINE HCL (PF) 0.25 % IJ SOLN
INTRAMUSCULAR | Status: AC
  Filled 2016-09-04: qty 30

## 2016-09-04 MED ORDER — MORPHINE SULFATE (PF) 4 MG/ML IV SOLN
INTRAVENOUS | Status: AC
  Filled 2016-09-04: qty 1

## 2016-09-04 MED ORDER — ROCURONIUM BROMIDE 50 MG/5ML IV SOSY
PREFILLED_SYRINGE | INTRAVENOUS | Status: AC
  Filled 2016-09-04: qty 10

## 2016-09-04 MED ORDER — LIDOCAINE HCL (CARDIAC) 20 MG/ML IV SOLN
INTRAVENOUS | Status: DC | PRN
  Administered 2016-09-04: 60 mg via INTRAVENOUS

## 2016-09-04 MED ORDER — BUPIVACAINE HCL (PF) 0.5 % IJ SOLN
INTRAMUSCULAR | Status: DC | PRN
  Administered 2016-09-04: 10 mL

## 2016-09-04 MED ORDER — CHLORHEXIDINE GLUCONATE 4 % EX LIQD: 60.0000 mL | Freq: Once | CUTANEOUS | Status: DC

## 2016-09-04 MED ORDER — CLONIDINE HCL (ANALGESIA) 100 MCG/ML EP SOLN
EPIDURAL | Status: DC | PRN
  Administered 2016-09-04: 1 mL

## 2016-09-04 MED ORDER — PROPOFOL 10 MG/ML IV BOLUS
INTRAVENOUS | Status: AC
  Filled 2016-09-04: qty 20

## 2016-09-04 MED ORDER — OXYCODONE HCL 5 MG/5ML PO SOLN: 5.0000 mg | Freq: Once | ORAL | Status: AC | PRN

## 2016-09-04 MED ORDER — MIDAZOLAM HCL 2 MG/2ML IJ SOLN
INTRAMUSCULAR | Status: AC
  Administered 2016-09-04: 2 mg
  Filled 2016-09-04: qty 2

## 2016-09-04 MED ORDER — BUPIVACAINE HCL (PF) 0.5 % IJ SOLN
INTRAMUSCULAR | Status: AC
  Filled 2016-09-04: qty 30

## 2016-09-04 MED ORDER — ONDANSETRON HCL 4 MG/2ML IJ SOLN: 4.0000 mg | Freq: Once | INTRAMUSCULAR | Status: DC | PRN

## 2016-09-04 MED ORDER — LIDOCAINE-EPINEPHRINE 1 %-1:100000 IJ SOLN
INTRAMUSCULAR | Status: DC | PRN
  Administered 2016-09-04: 25 mL

## 2016-09-04 MED ORDER — MIDAZOLAM HCL 2 MG/2ML IJ SOLN
INTRAMUSCULAR | Status: AC
  Filled 2016-09-04: qty 2

## 2016-09-04 MED ORDER — PROPOFOL 10 MG/ML IV BOLUS
INTRAVENOUS | Status: DC | PRN
  Administered 2016-09-04: 200 mg via INTRAVENOUS

## 2016-09-04 MED ORDER — FENTANYL CITRATE (PF) 100 MCG/2ML IJ SOLN
INTRAMUSCULAR | Status: AC
  Filled 2016-09-04: qty 2

## 2016-09-04 MED ORDER — OXYCODONE HCL 5 MG PO TABS
5.0000 mg | ORAL_TABLET | Freq: Once | ORAL | Status: AC | PRN
  Administered 2016-09-04: 5 mg via ORAL

## 2016-09-04 MED ORDER — ONDANSETRON HCL 4 MG/2ML IJ SOLN
INTRAMUSCULAR | Status: DC | PRN
  Administered 2016-09-04: 4 mg via INTRAVENOUS

## 2016-09-04 MED ORDER — FENTANYL CITRATE (PF) 100 MCG/2ML IJ SOLN
INTRAMUSCULAR | Status: DC | PRN
  Administered 2016-09-04: 25 ug via INTRAVENOUS
  Administered 2016-09-04 (×2): 50 ug via INTRAVENOUS
  Administered 2016-09-04 (×2): 25 ug via INTRAVENOUS
  Administered 2016-09-04: 50 ug via INTRAVENOUS
  Administered 2016-09-04 (×3): 25 ug via INTRAVENOUS

## 2016-09-04 MED ORDER — CLONIDINE HCL (ANALGESIA) 100 MCG/ML EP SOLN
150.0000 ug | Freq: Once | EPIDURAL | Status: DC
  Filled 2016-09-04: qty 1.5

## 2016-09-04 MED ORDER — LIDOCAINE-EPINEPHRINE (PF) 1 %-1:200000 IJ SOLN
INTRAMUSCULAR | Status: AC
  Filled 2016-09-04: qty 30

## 2016-09-04 MED ORDER — ARTIFICIAL TEARS OP OINT
TOPICAL_OINTMENT | OPHTHALMIC | Status: AC
  Filled 2016-09-04: qty 7

## 2016-09-04 MED ORDER — ONDANSETRON HCL 4 MG/2ML IJ SOLN
INTRAMUSCULAR | Status: AC
  Filled 2016-09-04: qty 8

## 2016-09-04 MED ORDER — FENTANYL CITRATE (PF) 100 MCG/2ML IJ SOLN
INTRAMUSCULAR | Status: AC
  Administered 2016-09-04: 100 ug
  Filled 2016-09-04: qty 2

## 2016-09-04 MED ORDER — LIDOCAINE 2% (20 MG/ML) 5 ML SYRINGE
INTRAMUSCULAR | Status: AC
  Filled 2016-09-04: qty 20

## 2016-09-04 SURGICAL SUPPLY — 107 items
ANCHOR BUTTON TIGHTROPE ACL RT (Orthopedic Implant) ×1 IMPLANT
BANDAGE ACE 4X5 VEL STRL LF (GAUZE/BANDAGES/DRESSINGS) ×1 IMPLANT
BANDAGE ACE 6X5 VEL STRL LF (GAUZE/BANDAGES/DRESSINGS) ×2 IMPLANT
BANDAGE ELASTIC 6 VELCRO ST LF (GAUZE/BANDAGES/DRESSINGS) ×1 IMPLANT
BANDAGE ESMARK 6X9 LF (GAUZE/BANDAGES/DRESSINGS) ×1 IMPLANT
BLADE CUDA 5.5 (BLADE) ×2 IMPLANT
BLADE GREAT WHITE 4.2 (BLADE) ×2 IMPLANT
BLADE SURG 10 STRL SS (BLADE) ×2 IMPLANT
BLADE SURG 15 STRL LF DISP TIS (BLADE) ×2 IMPLANT
BLADE SURG 15 STRL SS (BLADE) ×4
BNDG CMPR 9X6 STRL LF SNTH (GAUZE/BANDAGES/DRESSINGS) ×1
BNDG CMPR MED 15X6 ELC VLCR LF (GAUZE/BANDAGES/DRESSINGS) ×1
BNDG ELASTIC 6X15 VLCR STRL LF (GAUZE/BANDAGES/DRESSINGS) ×2 IMPLANT
BNDG ESMARK 6X9 LF (GAUZE/BANDAGES/DRESSINGS) ×2
BONE MATRIX DEMINERALIZED 1CC (Bone Implant) ×2 IMPLANT
BUR OVAL 6.0 (BURR) ×2 IMPLANT
CLSR STERI-STRIP ANTIMIC 1/2X4 (GAUZE/BANDAGES/DRESSINGS) ×1 IMPLANT
COVER SURGICAL LIGHT HANDLE (MISCELLANEOUS) ×2 IMPLANT
CUFF TOURNIQUET SINGLE 34IN LL (TOURNIQUET CUFF) ×1 IMPLANT
CUFF TOURNIQUET SINGLE 44IN (TOURNIQUET CUFF) IMPLANT
DECANTER SPIKE VIAL GLASS SM (MISCELLANEOUS) ×2 IMPLANT
DRAPE ARTHROSCOPY W/POUCH 114 (DRAPES) ×2 IMPLANT
DRAPE INCISE IOBAN 66X45 STRL (DRAPES) ×2 IMPLANT
DRAPE U-SHAPE 47X51 STRL (DRAPES) ×2 IMPLANT
DRILL FLIPCUTTER II 9.0MM (INSTRUMENTS) IMPLANT
DRSG PAD ABDOMINAL 8X10 ST (GAUZE/BANDAGES/DRESSINGS) ×6 IMPLANT
ELECT REM PT RETURN 9FT ADLT (ELECTROSURGICAL) ×2
ELECTRODE REM PT RTRN 9FT ADLT (ELECTROSURGICAL) ×1 IMPLANT
EVACUATOR 1/8 PVC DRAIN (DRAIN) IMPLANT
FIBERSTICK 2 (SUTURE) ×2 IMPLANT
FLIPCUTTER II 9.0MM (INSTRUMENTS) ×2
GAUZE SPONGE 4X4 12PLY STRL (GAUZE/BANDAGES/DRESSINGS) ×4 IMPLANT
GAUZE XEROFORM 1X8 LF (GAUZE/BANDAGES/DRESSINGS) ×2 IMPLANT
GAUZE XEROFORM 5X9 LF (GAUZE/BANDAGES/DRESSINGS) ×1 IMPLANT
GLOVE BIO SURGEON ST LM GN SZ9 (GLOVE) ×2 IMPLANT
GLOVE BIOGEL PI IND STRL 6.5 (GLOVE) IMPLANT
GLOVE BIOGEL PI IND STRL 7.0 (GLOVE) IMPLANT
GLOVE BIOGEL PI IND STRL 8 (GLOVE) ×1 IMPLANT
GLOVE BIOGEL PI IND STRL 9 (GLOVE) ×1 IMPLANT
GLOVE BIOGEL PI INDICATOR 6.5 (GLOVE) ×3
GLOVE BIOGEL PI INDICATOR 7.0 (GLOVE) ×1
GLOVE BIOGEL PI INDICATOR 8 (GLOVE) ×1
GLOVE BIOGEL PI INDICATOR 9 (GLOVE) ×1
GLOVE SURG ORTHO 8.0 STRL STRW (GLOVE) ×2 IMPLANT
GOWN STRL REUS W/ TWL LRG LVL3 (GOWN DISPOSABLE) ×3 IMPLANT
GOWN STRL REUS W/ TWL XL LVL3 (GOWN DISPOSABLE) ×1 IMPLANT
GOWN STRL REUS W/TWL LRG LVL3 (GOWN DISPOSABLE) ×6
GOWN STRL REUS W/TWL XL LVL3 (GOWN DISPOSABLE) ×2
KIT BASIN OR (CUSTOM PROCEDURE TRAY) ×2 IMPLANT
KIT BIOCARTILAGE DEL W/SYRINGE (KITS) ×1 IMPLANT
KIT ROOM TURNOVER OR (KITS) ×2 IMPLANT
KNIFE GRAFT ACL 10MM 5952 (MISCELLANEOUS) IMPLANT
MANIFOLD NEPTUNE II (INSTRUMENTS) ×2 IMPLANT
NDL 18GX1X1/2 (RX/OR ONLY) (NEEDLE) ×1 IMPLANT
NDL FILTER BLUNT 18X1 1/2 (NEEDLE) IMPLANT
NDL KEITH (NEEDLE) ×1 IMPLANT
NDL STRAIGHT KEITH (NEEDLE) IMPLANT
NEEDLE 18GX1X1/2 (RX/OR ONLY) (NEEDLE) ×2 IMPLANT
NEEDLE FILTER BLUNT 18X 1/2SAF (NEEDLE) ×1
NEEDLE FILTER BLUNT 18X1 1/2 (NEEDLE) ×1 IMPLANT
NEEDLE KEITH (NEEDLE) ×2 IMPLANT
NEEDLE STRAIGHT KEITH (NEEDLE) IMPLANT
NS IRRIG 1000ML POUR BTL (IV SOLUTION) ×2 IMPLANT
PACK ARTHROSCOPY DSU (CUSTOM PROCEDURE TRAY) ×2 IMPLANT
PAD ABD 8X10 STRL (GAUZE/BANDAGES/DRESSINGS) ×1 IMPLANT
PAD ARMBOARD 7.5X6 YLW CONV (MISCELLANEOUS) ×4 IMPLANT
PAD CAST 4YDX4 CTTN HI CHSV (CAST SUPPLIES) ×1 IMPLANT
PADDING CAST COTTON 4X4 STRL (CAST SUPPLIES) ×2
PADDING CAST COTTON 6X4 STRL (CAST SUPPLIES) ×4 IMPLANT
PASSER SUT SWANSON 36MM LOOP (INSTRUMENTS) ×2 IMPLANT
PENCIL BUTTON HOLSTER BLD 10FT (ELECTRODE) IMPLANT
PK GRAFTLINK AUTO IMPLANT SYST (Anchor) ×2 IMPLANT
REAMER C 10MM (INSTRUMENTS) IMPLANT
SET ARTHROSCOPY TUBING (MISCELLANEOUS) ×2
SET ARTHROSCOPY TUBING LN (MISCELLANEOUS) ×1 IMPLANT
SPONGE GAUZE 4X4 12PLY STER LF (GAUZE/BANDAGES/DRESSINGS) ×1 IMPLANT
SPONGE LAP 4X18 X RAY DECT (DISPOSABLE) ×4 IMPLANT
SPONGE SCRUB IODOPHOR (GAUZE/BANDAGES/DRESSINGS) ×2 IMPLANT
SUCTION FRAZIER HANDLE 10FR (MISCELLANEOUS) ×1
SUCTION TUBE FRAZIER 10FR DISP (MISCELLANEOUS) ×1 IMPLANT
SUT 2 FIBERLOOP 20 STRT BLUE (SUTURE) ×2
SUT ETHIBOND 5 LR DA (SUTURE) IMPLANT
SUT ETHILON 3 0 FSL (SUTURE) ×1 IMPLANT
SUT ETHILON 3 0 PS 1 (SUTURE) ×2 IMPLANT
SUT FIBERWIRE #2 38 T-5 BLUE (SUTURE) ×2
SUT MENISCAL KIT (KITS) IMPLANT
SUT MNCRL AB 3-0 PS2 18 (SUTURE) ×2 IMPLANT
SUT PROLENE 3 0 PS 2 (SUTURE) ×2 IMPLANT
SUT VIC AB 0 CT1 27 (SUTURE) ×2
SUT VIC AB 0 CT1 27XBRD ANBCTR (SUTURE) ×1 IMPLANT
SUT VIC AB 2-0 CT1 27 (SUTURE) ×4
SUT VIC AB 2-0 CT1 TAPERPNT 27 (SUTURE) ×1 IMPLANT
SUTURE 2 FIBERLOOP 20 STRT BLU (SUTURE) ×1 IMPLANT
SUTURE FIBERWR #2 38 T-5 BLUE (SUTURE) ×1 IMPLANT
SUTURE TIGERSTICK 2 TIGERWIR 2 (MISCELLANEOUS) ×1 IMPLANT
SYR 30ML LL (SYRINGE) ×2 IMPLANT
SYR 30ML SLIP (SYRINGE) ×2 IMPLANT
SYR BULB IRRIGATION 50ML (SYRINGE) ×2 IMPLANT
SYR TB 1ML LUER SLIP (SYRINGE) ×3 IMPLANT
SYSTEM GRAFT IMPLANT AUTOGRAFT (Anchor) IMPLANT
TIGERSTICK 2 TIGERWIRE 2 (MISCELLANEOUS) ×2
TOWEL OR 17X24 6PK STRL BLUE (TOWEL DISPOSABLE) ×2 IMPLANT
TOWEL OR 17X26 10 PK STRL BLUE (TOWEL DISPOSABLE) ×2 IMPLANT
UNDERPAD 30X30 (UNDERPADS AND DIAPERS) ×2 IMPLANT
WAND HAND CNTRL MULTIVAC 90 (MISCELLANEOUS) ×2 IMPLANT
WATER STERILE IRR 1000ML POUR (IV SOLUTION) ×2 IMPLANT
WRAP KNEE MAXI GEL POST OP (GAUZE/BANDAGES/DRESSINGS) ×1 IMPLANT

## 2016-09-04 NOTE — Interval H&P Note (Signed)
History and Physical Interval Note:  09/04/2016 1:06 PM  Lisa [name]  has presented today for surgery, with the diagnosis of LEFT KNEE ANTERIOR CRUCIATE LIGAMENT, MEDIAL COLLATERAL LIGAMENT TEAR, MENISCAL TEARS  The various methods of treatment have been discussed with the patient and family. After consideration of risks, benefits and other options for treatment, the patient has consented to  Procedure(s): LEFT KNEE MEDIAL COLLATERAL LIGAMENT REPAIR, ANTERIOR CRUCIATE LIGAMENT RECONSTRUCTION, MENISCAL DEBRIDEMENT VERSUS REPAIR (Left) as a surgical intervention .  The patient's history has been reviewed, patient examined, no change in status, stable for surgery.  I have reviewed the patient's chart and labs.  Questions were answered to the patient's satisfaction.     Burnard BuntingG Scott Ruberta Holck

## 2016-09-04 NOTE — Anesthesia Procedure Notes (Signed)
Procedure Name: LMA Insertion Date/Time: 09/04/2016 2:18 PM Performed by: Dairl PonderJIANG, Jaionna Weisse Pre-anesthesia Checklist: Patient identified, Emergency Drugs available, Suction available, Patient being monitored and Timeout performed Patient Re-evaluated:Patient Re-evaluated prior to inductionOxygen Delivery Method: Circle system utilized Preoxygenation: Pre-oxygenation with 100% oxygen Intubation Type: IV induction LMA: LMA inserted LMA Size: 4.0 Number of attempts: 1 Placement Confirmation: positive ETCO2 and breath sounds checked- equal and bilateral Tube secured with: Tape Dental Injury: Teeth and Oropharynx as per pre-operative assessment

## 2016-09-04 NOTE — Anesthesia Preprocedure Evaluation (Signed)
Anesthesia Evaluation  Patient identified by MRN, date of birth, ID band Patient awake    Reviewed: Allergy & Precautions, NPO status , Patient's Chart, lab work & pertinent test results  Airway Mallampati: II  TM Distance: >3 FB Neck ROM: Full    Dental  (+) Teeth Intact, Dental Advisory Given   Pulmonary former smoker,    breath sounds clear to auscultation       Cardiovascular hypertension,  Rhythm:Regular Rate:Normal     Neuro/Psych    GI/Hepatic   Endo/Other    Renal/GU      Musculoskeletal   Abdominal   Peds  Hematology   Anesthesia Other Findings   Reproductive/Obstetrics                             Anesthesia Physical Anesthesia Plan  ASA: II  Anesthesia Plan: General and Regional   Post-op Pain Management:    Induction: Intravenous  Airway Management Planned: LMA  Additional Equipment:   Intra-op Plan:   Post-operative Plan:   Informed Consent:   Dental advisory given  Plan Discussed with: CRNA and Anesthesiologist  Anesthesia Plan Comments:         Anesthesia Quick Evaluation

## 2016-09-04 NOTE — Transfer of Care (Signed)
Immediate Anesthesia Transfer of Care Note  Patient: Lisa [name]  Procedure(s) Performed: Procedure(s): LEFT KNEE MEDIAL COLLATERAL LIGAMENT REPAIR, ANTERIOR CRUCIATE LIGAMENT RECONSTRUCTION, MENISCAL DEBRIDEMENT VERSUS REPAIR (Left)  Patient Location: PACU  Anesthesia Type:GA combined with regional for post-op pain  Level of Consciousness: awake, alert  and oriented  Airway & Oxygen Therapy: Patient Spontanous Breathing and Patient connected to nasal cannula oxygen  Post-op Assessment: Report given to RN and Post -op Vital signs reviewed and stable  Post vital signs: Reviewed and stable  Last Vitals:  Vitals:   09/04/16 1250 09/04/16 1700  BP:  (!) 147/97  Pulse: 96 (!) 103  Resp: 12 (!) 28  Temp:  36.6 C    Last Pain:  Vitals:   09/04/16 1135  TempSrc: Oral      Patients Stated Pain Goal: 3 (09/04/16 1145)  Complications: No apparent anesthesia complications

## 2016-09-04 NOTE — Brief Op Note (Signed)
09/04/2016  5:24 PM  PATIENT:  Lisa [name]  46 y.o. female  PRE-OPERATIVE DIAGNOSIS:  LEFT KNEE ANTERIOR CRUCIATE LIGAMENT, MEDIAL COLLATERAL LIGAMENT TEAR, MENISCAL TEARS  POST-OPERATIVE DIAGNOSIS:  LEFT KNEE ANTERIOR CRUCIATE LIGAMENT, MEDIAL and lateral meniscal tears with stable PCL and MCL   PROCEDURE:  Procedure(s): LEFT KNEE MEDIAL and lateral meniscal debridement with autograft , ANTERIOR CRUCIATE LIGAMENT RECONSTRUCTION,  SURGEON:  Surgeon(s): Cammy CopaScott Alexsis Branscom, MD  ASSISTANT: Lesly Dukes Bethune rnfa  ANESTHESIA:   epidural  EBL: 15 ml    Total I/O In: 1200 [I.V.:1200] Out: 25 [Blood:25]  BLOOD ADMINISTERED: none  DRAINS: none   LOCAL MEDICATIONS USED:  Marcaine morphine clonidine injected into the knee postop  SPECIMEN:  No Specimen  COUNTS:  YES  TOURNIQUET:    DICTATION: .Other Dictation: Dictation Number [id number]  PLAN OF CARE: Discharge to home after PACU  PATIENT DISPOSITION:  PACU - hemodynamically stable

## 2016-09-04 NOTE — Anesthesia Postprocedure Evaluation (Signed)
Anesthesia Post Note  Patient: [name]  Procedure(s) Performed: Procedure(s) (LRB): LEFT KNEE MEDIAL COLLATERAL LIGAMENT REPAIR, ANTERIOR CRUCIATE LIGAMENT RECONSTRUCTION, MENISCAL DEBRIDEMENT VERSUS REPAIR (Left)  Patient location during evaluation: SICU Anesthesia Type: Regional and General Level of consciousness: awake, awake and alert and oriented Pain management: pain level controlled Vital Signs Assessment: post-procedure vital signs reviewed and stable Respiratory status: spontaneous breathing, nonlabored ventilation and respiratory function stable Cardiovascular status: blood pressure returned to baseline Postop Assessment: no headache Anesthetic complications: no       Last Vitals:  Vitals:   09/04/16 1800 09/04/16 1805  BP:  (!) 139/94  Pulse: 95 76  Resp: 12 (!) 21  Temp: 36.7 C     Last Pain:  Vitals:   09/04/16 1730  TempSrc:   PainSc: 9                  Lisa [name]

## 2016-09-04 NOTE — Anesthesia Procedure Notes (Signed)
Anesthesia Regional Block:  Adductor canal block  Pre-Anesthetic Checklist: ,, timeout performed, Correct Patient, Correct Site, Correct Laterality, Correct Procedure, Correct Position, site marked, Risks and benefits discussed,  Surgical consent,  Pre-op evaluation,  At surgeon's request and post-op pain management  Laterality: Left  Prep: chloraprep       Needles:  Injection technique: Single-shot  Needle Type: Stimulator Needle - 80          Additional Needles:  Procedures: ultrasound guided (picture in chart) Adductor canal block Narrative:  Start time: 09/04/2016 2:50 PM End time: 09/04/2016 2:55 PM Injection made incrementally with aspirations every 5 mL.  Performed by: Personally  Anesthesiologist: Aveena Bari  Additional Notes: 20 cc 0.75% Naropin injected easily

## 2016-09-05 ENCOUNTER — Encounter (HOSPITAL_COMMUNITY): Payer: Self-pay | Admitting: Orthopedic Surgery

## 2016-09-05 DIAGNOSIS — S83512A Sprain of anterior cruciate ligament of left knee, initial encounter: Secondary | ICD-10-CM | POA: Diagnosis not present

## 2016-09-05 NOTE — Op Note (Deleted)
  The note originally documented on this encounter has been moved the the encounter in which it belongs.  

## 2016-09-05 NOTE — Op Note (Signed)
NAME:  Lisa [name], Lisa [name] NO.:  [id number]  MEDICAL RECORD NO.:  [id number]  LOCATION:                                 FACILITY:  PHYSICIAN:  Burnard Bunting, M.D.    DATE OF BIRTH:  1971-06-12  DATE OF PROCEDURE: DATE OF DISCHARGE:                              OPERATIVE REPORT   PREOPERATIVE DIAGNOSES:  Left knee ACL tear, MCL partial tear, PCL partial tear with medial and lateral meniscal tears.  POSTOPERATIVE DIAGNOSES:  Left knee ACL tear, MCL partial tear, PCL partial tear with medial and lateral meniscal tears.  PROCEDURE:  Left knee arthroscopy with autograft hamstring ACL reconstruction using dual EndoButton technique from [employer] with partial medial and lateral meniscectomies.  SURGEON ATTENDING:  Burnard Bunting, M.D.  ASSISTANT:  Patrick Jupiter, RNFA.  INDICATIONS:  Novella is a 46 year old patient who sustained left knee injury 6 weeks ago.  This patient had partial MCL and PCL tear, which have improved and nearly fully healed.  She presents with continued ACL laxity as well as medial and lateral meniscal tears.  PROCEDURE IN DETAIL:  The patient was brought to the operating room, where general anesthetic was induced.  Preoperative IV antibiotics administered.  A time-out was called.  Both the patient's knees were examined.  There was no posterolateral rotatory instability in the left knee or right knee.  ACL laxity was present.  The patient had grade 1 PCL laxity, but a good endpoint on the left knee.  In regard to the [facility], the patient had 1 to 2 more millimeters of laxity to valgus stress at 30 degrees, but was stable at 0 degrees.  This actually tightened up after the ACL reconstruction.  Following examination under anesthesia, the left leg was pre-scrubbed with alcohol and Betadine and allowed to air dry, prepped with DuraPrep solution, and draped in a sterile manner. Tourniquet was not utilized.  The Collier Flowers was used to cover the operative field.   Semitendinosus tendon was then harvested from an incision over the pes tendons.  This was a longitudinal incision about 3 cm.  Skin and subcutaneous tissue were sharply divided.  Tendons were identified and harvested.  Initial tendon gave a graft of about 7.5 mm.  This was supplemented with the gracilis, which gave a graft of 9 mm.  Dual EndoButton technique was performed on the back table.  Concurrent with this, the incision was irrigated and packed with a moist sponge and then the anterior inferolateral and anterior inferomedial portals were established.  Diagnostic arthroscopy demonstrated intact patellofemoral compartment, torn ACL, stable PCL attachment on the femur as well as the tibia when visualized after debridement of the ACL.  The patient had medial and lateral meniscal tears, both of which were degenerative in nature and non-repairable.  This involved about 50% anterior and posterior width of the meniscus tear on the medial side and about 40% on the lateral side.  Following creation of the portals and meniscal debridement, notchplasty was performed and ACL stump debrided.  The tunnel was then drilled on the femur using a 9 flip cutter in the 3 o'clock position, drilled on the tibia using a flip  cutter in the native ACL footprint.  The graft was passed and StimuBlast placed into the tunnels.  Good fixation achieved on both sides.  Knee was tensioned in extension.  Following this, the knee was taken through a range of motion and found to have excellent range of motion and good stability.  MCL felt symmetric with the right leg preoperative examination.  Because of this, it was not elected to proceed with any type of reefing procedure on the medial side.  At this time, thorough irrigation was performed of the incision and the knee joint.  Harvest incision was closed using 0 Vicryl suture, 2-0 Vicryl suture, and 3-0 Monocryl.  Three of the portal incisions were closed using 2-0  Vicryl and 3-0 nylon.  Solution of Marcaine, morphine, and clonidine injected to the knee.  Impervious dressings placed, then Ace wrap, and then knee immobilizer.  The patient tolerated the procedure well without immediate complications and transferred to the recovery room in stable condition.     Burnard BuntingG. Scott Dean, M.D.   ______________________________ Reece AgarG. Dorene GrebeScott Dean, M.D.    GSD/MEDQ  D:  09/04/2016  T:  09/05/2016  Job:  [id number]

## 2016-09-07 ENCOUNTER — Other Ambulatory Visit (INDEPENDENT_AMBULATORY_CARE_PROVIDER_SITE_OTHER): Payer: Self-pay | Admitting: Orthopedic Surgery

## 2016-09-07 ENCOUNTER — Other Ambulatory Visit (INDEPENDENT_AMBULATORY_CARE_PROVIDER_SITE_OTHER): Payer: Self-pay | Admitting: Radiology

## 2016-09-07 MED ORDER — METHOCARBAMOL 500 MG PO TABS: 500.0000 mg | ORAL_TABLET | Freq: Three times a day (TID) | ORAL | 0 refills | Status: DC | PRN

## 2016-09-07 MED ORDER — OXYCODONE HCL 5 MG PO TABS: 5.0000 mg | ORAL_TABLET | Freq: Four times a day (QID) | ORAL | 0 refills | Status: DC | PRN

## 2016-09-07 NOTE — Telephone Encounter (Signed)
Patient is requesting a refill on pain meds (has 22 left, taking every 4 hours) cb#: [phone] Ok for [employer] to pickup.

## 2016-09-07 NOTE — Telephone Encounter (Signed)
Please advise 

## 2016-09-10 ENCOUNTER — Inpatient Hospital Stay (INDEPENDENT_AMBULATORY_CARE_PROVIDER_SITE_OTHER): Payer: BLUE CROSS/BLUE SHIELD | Admitting: Orthopedic Surgery

## 2016-09-12 ENCOUNTER — Ambulatory Visit (INDEPENDENT_AMBULATORY_CARE_PROVIDER_SITE_OTHER): Payer: BLUE CROSS/BLUE SHIELD | Admitting: Orthopedic Surgery

## 2016-09-12 ENCOUNTER — Encounter (INDEPENDENT_AMBULATORY_CARE_PROVIDER_SITE_OTHER): Payer: Self-pay | Admitting: Orthopedic Surgery

## 2016-09-12 DIAGNOSIS — S83512D Sprain of anterior cruciate ligament of left knee, subsequent encounter: Secondary | ICD-10-CM

## 2016-09-12 MED ORDER — METHOCARBAMOL 500 MG PO TABS: 500.0000 mg | ORAL_TABLET | Freq: Three times a day (TID) | ORAL | 0 refills | Status: DC | PRN

## 2016-09-12 MED ORDER — ONDANSETRON HCL 4 MG PO TABS: 4.0000 mg | ORAL_TABLET | Freq: Three times a day (TID) | ORAL | 0 refills | Status: DC | PRN

## 2016-09-12 NOTE — Addendum Note (Signed)
Addended byCherre Huger: Tailey Top on: 09/12/2016 10:50 AM   Modules accepted: Orders

## 2016-09-12 NOTE — Progress Notes (Signed)
   Post-Op Visit Note   Patient: Lisa [name]           Date of Birth: 05/27/1971           MRN: [id number] Visit Date: 09/12/2016 PCP: Abbe AmsterdamOPLAND,JESSICA, MD   Assessment & Plan:  Chief Complaint:  Chief Complaint  Patient presents with  . Left Knee - Routine Post Op   Visit Diagnoses:  1. Rupture of anterior cruciate ligament of left knee, subsequent encounter     Plan: Lisa BattenKim is a patient is now a week out from left knee arthroscopy anterior cruciate ligament reconstruction hamstring autograft and partial medial lateral meniscectomies.  She's doing well.  No calf tenderness.  She has close to full extension and flexion to about 60.  She's in the CPM machine.  We will start her in physical therapy next week continue with CPM machine for 3-5 hours per day sutures DC today.  Two-week return Zofran refilled  Follow-Up Instructions: No Follow-up on file.   Orders:  No orders of the defined types were placed in this encounter.  Meds ordered this encounter  Medications  . ondansetron (ZOFRAN) 4 MG tablet    Sig: Take 1 tablet (4 mg total) by mouth every 8 (eight) hours as needed for nausea or vomiting.    Dispense:  30 tablet    Refill:  0    Imaging: No results found.  PMFS History: Patient Active Problem List   Diagnosis Date Noted  . Left ACL tear 08/03/2016  . Acute medial meniscus tear of left knee 08/03/2016  . Acute lateral meniscus tear of left knee 08/03/2016  . Acute pain of left knee 07/26/2016  . History of abnormal cervical Pap smear 09/21/2013  . Leukocytopenia, unspecified 03/02/2013  . S/P hip replacement 02/06/2013  . Hyperlipidemia 10/13/2012  . HTN (hypertension) 10/13/2012   Past Medical History:  Diagnosis Date  . Anemia    hx of  . Anxiety 20 years ago  . Depression 20 years ago  . GERD (gastroesophageal reflux disease)   . Heart murmur   . Hyperlipidemia   . Hypertension    history of htn, no meds now  . Ulcer (HCC)    x2    Family History   Problem Relation Age of Onset  . Adopted: Yes    Past Surgical History:  Procedure Laterality Date  . ANTERIOR CRUCIATE LIGAMENT REPAIR Left 09/04/2016   Procedure: LEFT KNEE MEDIAL COLLATERAL LIGAMENT REPAIR, ANTERIOR CRUCIATE LIGAMENT RECONSTRUCTION, MENISCAL DEBRIDEMENT VERSUS REPAIR;  Surgeon: Cammy CopaScott Remmi Armenteros, MD;  Location: [facility];  Service: Orthopedics;  Laterality: Left;  . ESOPHAGOGASTRODUODENOSCOPY ENDOSCOPY     at least 4  . LAPAROSCOPIC APPENDECTOMY N/A 02/23/2015   Procedure: APPENDECTOMY LAPAROSCOPIC;  Surgeon: Gaynelle AduEric Wilson, MD;  Location: [facility];  Service: General;  Laterality: N/A;  . TOTAL HIP ARTHROPLASTY Left 02/06/2013   Procedure: LEFT TOTAL HIP ARTHROPLASTY ANTERIOR APPROACH;  Surgeon: Kathryne Hitchhristopher Y Blackman, MD;  Location: [facility];  Service: Orthopedics;  Laterality: Left;   Social History   Occupational History  . Not on file.   Social History Main Topics  . Smoking status: Former Smoker    Packs/day: 0.25    Years: 20.00    Types: Cigarettes    Quit date: 08/13/2013  . Smokeless tobacco: Never Used  . Alcohol use 0.0 oz/week     Comment: few glasses of wine a week  . Drug use: No  . Sexual activity: No

## 2016-09-17 ENCOUNTER — Telehealth (INDEPENDENT_AMBULATORY_CARE_PROVIDER_SITE_OTHER): Payer: Self-pay | Admitting: Orthopedic Surgery

## 2016-09-17 MED ORDER — OXYCODONE HCL 5 MG PO TABS: 5.0000 mg | ORAL_TABLET | Freq: Four times a day (QID) | ORAL | 0 refills | Status: DC | PRN

## 2016-09-17 MED ORDER — CYCLOBENZAPRINE HCL 10 MG PO TABS
10.0000 mg | ORAL_TABLET | Freq: Three times a day (TID) | ORAL | 0 refills | Status: DC | PRN
Start: 2016-09-17 — End: 2017-01-23

## 2016-09-17 NOTE — Telephone Encounter (Signed)
Rx request-  Oxycodone 5mg 

## 2016-09-17 NOTE — Telephone Encounter (Signed)
Patient is requesting a refill of oxycodone and flexeril.

## 2016-09-17 NOTE — Telephone Encounter (Signed)
Rx's done, signed and IC pt and advised ready for pickup at desk

## 2016-09-17 NOTE — Telephone Encounter (Signed)
Ok to rf plsl cla thx

## 2016-09-17 NOTE — Telephone Encounter (Signed)
Pt requesting refill of oxycontin pains meds  [phone]

## 2016-09-18 ENCOUNTER — Encounter (INDEPENDENT_AMBULATORY_CARE_PROVIDER_SITE_OTHER): Payer: Self-pay | Admitting: Orthopedic Surgery

## 2016-09-24 DIAGNOSIS — M25562 Pain in left knee: Secondary | ICD-10-CM | POA: Diagnosis not present

## 2016-09-26 ENCOUNTER — Encounter (INDEPENDENT_AMBULATORY_CARE_PROVIDER_SITE_OTHER): Payer: Self-pay | Admitting: Orthopedic Surgery

## 2016-09-26 ENCOUNTER — Ambulatory Visit (INDEPENDENT_AMBULATORY_CARE_PROVIDER_SITE_OTHER): Payer: BLUE CROSS/BLUE SHIELD | Admitting: Orthopedic Surgery

## 2016-09-26 DIAGNOSIS — S83512D Sprain of anterior cruciate ligament of left knee, subsequent encounter: Secondary | ICD-10-CM

## 2016-09-26 MED ORDER — OXYCODONE HCL 5 MG PO TABS: 5.0000 mg | ORAL_TABLET | Freq: Four times a day (QID) | ORAL | 0 refills | Status: DC | PRN

## 2016-09-26 MED ORDER — METHOCARBAMOL 500 MG PO TABS: 500.0000 mg | ORAL_TABLET | Freq: Three times a day (TID) | ORAL | 0 refills | Status: DC | PRN

## 2016-09-26 NOTE — Progress Notes (Signed)
Post-Op Visit Note   Patient: Lisa [name]           Date of Birth: 12/01/1970           MRN: [id number] Visit Date: 09/26/2016 PCP: Abbe AmsterdamOPLAND,JESSICA, MD   Assessment & Plan:  Chief Complaint:  Chief Complaint  Patient presents with  . Left Knee - Routine Post Op   Visit Diagnoses:  1. Rupture of anterior cruciate ligament of left knee, subsequent encounter     Plan: Lisa BattenKim is a 46 year old patient who is now 2 weeks out left knee anterior cruciate ligament reconstruction hamstring autograft.  She's The CPM machine and 90.  She's been in physical therapy.  She can do straight leg raises about 15.  Graft is stable.  The 2 mm more medial laxity on the left compared to the right but is not particular asymptomatic.  Plan at this time is to refill her pain medicine refill her Robaxin continue weightbearing as tolerated to change her out of the knee immobilizer into a hinge knee brace continue home exercises and physical therapy follow-up with me on March 5 which is a table for she's supposed to go back to work.  Her main rehabilitation goal of this time is to increase the flexion on the CPM and then also to work on some quad strengthening.  All  In all today her leg looks very good.no calf tenderness  Follow-Up Instructions: Return in about 3 weeks (around 10/15/2016).   Orders:  No orders of the defined types were placed in this encounter.  Meds ordered this encounter  Medications  . methocarbamol (ROBAXIN) 500 MG tablet    Sig: Take 1 tablet (500 mg total) by mouth every 8 (eight) hours as needed for muscle spasms.    Dispense:  30 tablet    Refill:  0  . oxyCODONE (ROXICODONE) 5 MG immediate release tablet    Sig: Take 1 tablet (5 mg total) by mouth every 6 (six) hours as needed for severe pain.    Dispense:  60 tablet    Refill:  0    Imaging: No results found.  PMFS History: Patient Active Problem List   Diagnosis Date Noted  . Left anterior cruciate ligament tear 08/03/2016    . Acute medial meniscus tear of left knee 08/03/2016  . Acute lateral meniscus tear of left knee 08/03/2016  . Acute pain of left knee 07/26/2016  . History of abnormal cervical Pap smear 09/21/2013  . Leukocytopenia, unspecified 03/02/2013  . S/P hip replacement 02/06/2013  . Hyperlipidemia 10/13/2012  . HTN (hypertension) 10/13/2012   Past Medical History:  Diagnosis Date  . Anemia    hx of  . Anxiety 20 years ago  . Depression 20 years ago  . GERD (gastroesophageal reflux disease)   . Heart murmur   . Hyperlipidemia   . Hypertension    history of htn, no meds now  . Ulcer (HCC)    x2    Family History  Problem Relation Age of Onset  . Adopted: Yes    Past Surgical History:  Procedure Laterality Date  . ANTERIOR CRUCIATE LIGAMENT REPAIR Left 09/04/2016   Procedure: LEFT KNEE MEDIAL COLLATERAL LIGAMENT REPAIR, ANTERIOR CRUCIATE LIGAMENT RECONSTRUCTION, MENISCAL DEBRIDEMENT VERSUS REPAIR;  Surgeon: Cammy CopaScott Pualani Borah, MD;  Location: [facility];  Service: Orthopedics;  Laterality: Left;  . ESOPHAGOGASTRODUODENOSCOPY ENDOSCOPY     at least 4  . LAPAROSCOPIC APPENDECTOMY N/A 02/23/2015   Procedure: APPENDECTOMY LAPAROSCOPIC;  Surgeon: Minerva AreolaEric  Andrey Campanile, MD;  Location: [facility];  Service: General;  Laterality: N/A;  . TOTAL HIP ARTHROPLASTY Left 02/06/2013   Procedure: LEFT TOTAL HIP ARTHROPLASTY ANTERIOR APPROACH;  Surgeon: Kathryne Hitch, MD;  Location: [facility];  Service: Orthopedics;  Laterality: Left;   Social History   Occupational History  . Not on file.   Social History Main Topics  . Smoking status: Former Smoker    Packs/day: 0.25    Years: 20.00    Types: Cigarettes    Quit date: 08/13/2013  . Smokeless tobacco: Never Used  . Alcohol use 0.0 oz/week     Comment: few glasses of wine a week  . Drug use: No  . Sexual activity: No

## 2016-09-27 DIAGNOSIS — M25562 Pain in left knee: Secondary | ICD-10-CM | POA: Diagnosis not present

## 2016-10-02 DIAGNOSIS — M25562 Pain in left knee: Secondary | ICD-10-CM | POA: Diagnosis not present

## 2016-10-04 DIAGNOSIS — M25562 Pain in left knee: Secondary | ICD-10-CM | POA: Diagnosis not present

## 2016-10-10 DIAGNOSIS — M25562 Pain in left knee: Secondary | ICD-10-CM | POA: Diagnosis not present

## 2016-10-12 DIAGNOSIS — M25562 Pain in left knee: Secondary | ICD-10-CM | POA: Diagnosis not present

## 2016-10-15 ENCOUNTER — Encounter (INDEPENDENT_AMBULATORY_CARE_PROVIDER_SITE_OTHER): Payer: Self-pay | Admitting: Orthopedic Surgery

## 2016-10-15 ENCOUNTER — Ambulatory Visit (INDEPENDENT_AMBULATORY_CARE_PROVIDER_SITE_OTHER): Payer: BLUE CROSS/BLUE SHIELD | Admitting: Orthopedic Surgery

## 2016-10-15 DIAGNOSIS — M25562 Pain in left knee: Secondary | ICD-10-CM | POA: Diagnosis not present

## 2016-10-15 DIAGNOSIS — S83242D Other tear of medial meniscus, current injury, left knee, subsequent encounter: Secondary | ICD-10-CM

## 2016-10-15 DIAGNOSIS — S83282D Other tear of lateral meniscus, current injury, left knee, subsequent encounter: Secondary | ICD-10-CM

## 2016-10-15 DIAGNOSIS — S83512D Sprain of anterior cruciate ligament of left knee, subsequent encounter: Secondary | ICD-10-CM

## 2016-10-17 NOTE — Progress Notes (Signed)
Post-Op Visit Note   Patient: Lisa [name]           Date of Birth: 05/13/1971           MRN: [id number] Visit Date: 10/15/2016 PCP: Abbe AmsterdamOPLAND,JESSICA, MD   Assessment & Plan:  Chief Complaint:  Chief Complaint  Patient presents with  . Left Knee - Routine Post Op   Visit Diagnoses:  1. Rupture of anterior cruciate ligament of left knee, subsequent encounter   2. Acute lateral meniscus tear of left knee, subsequent encounter   3. Acute medial meniscus tear of left knee, subsequent encounter     Plan: Lisa [name] is a 46 year old patient with left knee anterior cruciate ligament reconstruction with partial medial lateral meniscectomy 09/04/2016.  She is now about 6 weeks out.  Space to return to work tomorrow.  She doing outpatient physical therapy twice a week.  Taking Robaxin and oxycodone for her symptoms but not on a regular basis.  On exam she has excellent range of motion stable graft particularly her MCL is healed nicely with good stability to varus and valgus stress at 0 and 30.  Trace effusion is present quad and hamstring strength improving.  Plan we'll let her return to her work tomorrow she is given need to wear tennis shoes at work for the next 2 and half months until she is a little bit more strength in that leg.  I'll see her back for clinical recheck in 6 weeks  Follow-Up Instructions: Return in about 6 weeks (around 11/26/2016).   Orders:  No orders of the defined types were placed in this encounter.  No orders of the defined types were placed in this encounter.   Imaging: No results found.  PMFS History: Patient Active Problem List   Diagnosis Date Noted  . Left anterior cruciate ligament tear 08/03/2016  . Acute medial meniscus tear of left knee 08/03/2016  . Acute lateral meniscus tear of left knee 08/03/2016  . Acute pain of left knee 07/26/2016  . History of abnormal cervical Pap smear 09/21/2013  . Leukocytopenia, unspecified 03/02/2013  . S/P hip  replacement 02/06/2013  . Hyperlipidemia 10/13/2012  . HTN (hypertension) 10/13/2012   Past Medical History:  Diagnosis Date  . Anemia    hx of  . Anxiety 20 years ago  . Depression 20 years ago  . GERD (gastroesophageal reflux disease)   . Heart murmur   . Hyperlipidemia   . Hypertension    history of htn, no meds now  . Ulcer (HCC)    x2    Family History  Problem Relation Age of Onset  . Adopted: Yes    Past Surgical History:  Procedure Laterality Date  . ANTERIOR CRUCIATE LIGAMENT REPAIR Left 09/04/2016   Procedure: LEFT KNEE MEDIAL COLLATERAL LIGAMENT REPAIR, ANTERIOR CRUCIATE LIGAMENT RECONSTRUCTION, MENISCAL DEBRIDEMENT VERSUS REPAIR;  Surgeon: Cammy CopaScott Masa Lubin, MD;  Location: [facility];  Service: Orthopedics;  Laterality: Left;  . ESOPHAGOGASTRODUODENOSCOPY ENDOSCOPY     at least 4  . LAPAROSCOPIC APPENDECTOMY N/A 02/23/2015   Procedure: APPENDECTOMY LAPAROSCOPIC;  Surgeon: Gaynelle AduEric Wilson, MD;  Location: [facility];  Service: General;  Laterality: N/A;  . TOTAL HIP ARTHROPLASTY Left 02/06/2013   Procedure: LEFT TOTAL HIP ARTHROPLASTY ANTERIOR APPROACH;  Surgeon: Kathryne Hitchhristopher Y Blackman, MD;  Location: [facility];  Service: Orthopedics;  Laterality: Left;   Social History   Occupational History  . Not on file.   Social History Main Topics  . Smoking status: Former Smoker  Packs/day: 0.25    Years: 20.00    Types: Cigarettes    Quit date: 08/13/2013  . Smokeless tobacco: Never Used  . Alcohol use 0.0 oz/week     Comment: few glasses of wine a week  . Drug use: No  . Sexual activity: No

## 2016-10-25 DIAGNOSIS — M25562 Pain in left knee: Secondary | ICD-10-CM | POA: Diagnosis not present

## 2016-10-29 ENCOUNTER — Ambulatory Visit (INDEPENDENT_AMBULATORY_CARE_PROVIDER_SITE_OTHER): Payer: BLUE CROSS/BLUE SHIELD | Admitting: Family Medicine

## 2016-10-29 ENCOUNTER — Encounter: Payer: Self-pay | Admitting: Family Medicine

## 2016-10-29 VITALS — BP 110/80 | HR 106 | Temp 98.1°F | Ht 68.0 in | Wt 166.6 lb

## 2016-10-29 DIAGNOSIS — N939 Abnormal uterine and vaginal bleeding, unspecified: Secondary | ICD-10-CM

## 2016-10-29 NOTE — Progress Notes (Signed)
[facility] [address], [address] [place], [place] [address] [phone] [phone]  Date:  10/29/2016   Name:  Lisa PonderKim M Speranza   DOB:  12/15/1970   MRN:  [id number]  PCP:  Abbe AmsterdamOPLAND,JESSICA, MD    Chief Complaint: Vaginal Bleeding (clots)   History of Present Illness:  Lisa [name] is a 46 y.o. very pleasant female patient who presents with the following:  Patient presenting today with vaginal bleeding overnight.  Patient woke up in the middle of the night with bleeding. Has been on Depo for the last 24 years with the exception of 3 when she was on the pill and got pregnant. Has not had a period ever when on Depo. Never spotted any of those tims. The blood clots are very dark, she put couple in a baggie. She has not missed or been late for her Depo shot.   She has been sexually active recently but was not aware of any trauma  Depo should actually decrease her risk of endometrial hyperplasia, but given this sudden change in her pattern would like for her to see OBG.  She plans to do so and actually has an appt scheduled for tomorrow   She did have knee surgery back in January per Dr. August Saucerean- she had fallen onto her knee and sustained a pretty severe injury with an ACL tear, tear of medial and lateral meniscus of her knee and ?plateau fracture of the tibia  Patient Active Problem List   Diagnosis Date Noted  . Left anterior cruciate ligament tear 08/03/2016  . Acute medial meniscus tear of left knee 08/03/2016  . Acute lateral meniscus tear of left knee 08/03/2016  . Acute pain of left knee 07/26/2016  . History of abnormal cervical Pap smear 09/21/2013  . Leukocytopenia, unspecified 03/02/2013  . S/P hip replacement 02/06/2013  . Hyperlipidemia 10/13/2012  . HTN (hypertension) 10/13/2012    Past Medical History:  Diagnosis Date  . Anemia    hx of  . Anxiety 20 years ago  . Depression 20 years ago  . GERD (gastroesophageal reflux  disease)   . Heart murmur   . Hyperlipidemia   . Hypertension    history of htn, no meds now  . Ulcer (HCC)    x2    Past Surgical History:  Procedure Laterality Date  . ANTERIOR CRUCIATE LIGAMENT REPAIR Left 09/04/2016   Procedure: LEFT KNEE MEDIAL COLLATERAL LIGAMENT REPAIR, ANTERIOR CRUCIATE LIGAMENT RECONSTRUCTION, MENISCAL DEBRIDEMENT VERSUS REPAIR;  Surgeon: Cammy CopaScott Gregory Dean, MD;  Location: [facility];  Service: Orthopedics;  Laterality: Left;  . ESOPHAGOGASTRODUODENOSCOPY ENDOSCOPY     at least 4  . LAPAROSCOPIC APPENDECTOMY N/A 02/23/2015   Procedure: APPENDECTOMY LAPAROSCOPIC;  Surgeon: Gaynelle AduEric Wilson, MD;  Location: [facility];  Service: General;  Laterality: N/A;  . TOTAL HIP ARTHROPLASTY Left 02/06/2013   Procedure: LEFT TOTAL HIP ARTHROPLASTY ANTERIOR APPROACH;  Surgeon: Kathryne Hitchhristopher Y Blackman, MD;  Location: [facility];  Service: Orthopedics;  Laterality: Left;    Social History  Substance Use Topics  . Smoking status: Former Smoker    Packs/day: 0.25    Years: 20.00    Types: Cigarettes    Quit date: 08/13/2013  . Smokeless tobacco: Never Used  . Alcohol use 0.0 oz/week     Comment: few glasses of wine a week    Family History  Problem Relation Age of Onset  . Adopted: Yes    Allergies  Allergen Reactions  .  Nsaids Other (See Comments)    ulcers    Medication list has been reviewed and updated.  Current Outpatient Prescriptions on File Prior to Visit  Medication Sig Dispense Refill  . diphenoxylate-atropine (LOMOTIL) 2.5-0.025 MG tablet Take 1 tablet by mouth 4 (four) times daily as needed for diarrhea or loose stools. 60 tablet 1  . MAGNESIUM PO Take 1 tablet by mouth daily.    . methocarbamol (ROBAXIN) 500 MG tablet Take 1 tablet (500 mg total) by mouth every 8 (eight) hours as needed for muscle spasms. 30 tablet 0  . ondansetron (ZOFRAN) 4 MG tablet Take 1 tablet (4 mg total) by mouth every 8 (eight) hours as needed for nausea or vomiting. 30 tablet 0  . oxyCODONE  (ROXICODONE) 5 MG immediate release tablet Take 1 tablet (5 mg total) by mouth every 6 (six) hours as needed for severe pain. 60 tablet 0  . pantoprazole (PROTONIX) 40 MG tablet Take 40 mg by mouth every morning.    Marland Kitchen POTASSIUM PO Take 1 tablet by mouth daily.    . simvastatin (ZOCOR) 20 MG tablet Take 1 tablet (20 mg total) by mouth every evening. 90 tablet 2  . SUMAtriptan (IMITREX) 50 MG tablet Take 1 tablet (50 mg total) by mouth every 2 (two) hours as needed for migraine. Max 100 mg in 24 hours 10 tablet 3  . cyclobenzaprine (FLEXERIL) 10 MG tablet Take 1 tablet (10 mg total) by mouth 3 (three) times daily as needed for muscle spasms. (Patient not taking: Reported on 10/15/2016) 30 tablet 0   No current facility-administered medications on file prior to visit.     Review of Systems:  As per HPI- otherwise negative.  No fever or chills No pelvic pain No nausea, vomiting    Physical Examination: Vitals:   10/29/16 1230  BP: 110/80  Pulse: (!) 106  Temp: 98.1 F (36.7 C)   Vitals:   10/29/16 1230  Weight: 166 lb 9.6 oz (75.6 kg)  Height: 5\' 8"  (1.727 m)   Body mass index is 25.33 kg/m. Ideal Body Weight: Weight in (lb) to have BMI = 25: 164.1  GEN: WDWN, NAD, Non-toxic, A & O x 3, looks well HEENT: Atraumatic, Normocephalic. Neck supple. No masses, No LAD. Ears and Nose: No external deformity. CV: RRR, No M/G/R. No JVD. No thrill. No extra heart sounds. PULM: CTA B, no wheezes, crackles, rhonchi. No retractions. No resp. distress. No accessory muscle use. ABD: S, NT, ND EXTR: No c/c/e NEURO Normal gait.  PSYCH: Normally interactive. Conversant. Not depressed or anxious appearing.  Calm demeanor.  GU: she has a small amount of darkish blood at the cervical os.  Appears to be typical uterine bleeding   Assessment and Plan:  Abnormal uterine bleeding  Here today with onset of vaginal bleeding after being on depo with NO bleeding for many years.  As she is over 40 I am  somewhat concerned about an endometrial issue and would like for her to see OBG.  Discussed with pt- she actually already has an appt for tomorrow and will keep me posted.  Discussed possible next steps ie: [place] for endometrial thickness and/ or endometrial bx   Signed Abbe Amsterdam, MD

## 2016-10-29 NOTE — Patient Instructions (Signed)
It was good to see you today- take care and I will look for your OBG note tomorrow. They may want to do an ultrasound to evaluate your endometrial stripe vs do an endometrial biopsy.   Take care and let me know if I can do anything to help!

## 2016-10-30 DIAGNOSIS — N939 Abnormal uterine and vaginal bleeding, unspecified: Secondary | ICD-10-CM | POA: Diagnosis not present

## 2016-10-31 DIAGNOSIS — M25562 Pain in left knee: Secondary | ICD-10-CM | POA: Diagnosis not present

## 2016-11-08 DIAGNOSIS — N939 Abnormal uterine and vaginal bleeding, unspecified: Secondary | ICD-10-CM | POA: Diagnosis not present

## 2016-11-09 DIAGNOSIS — M25562 Pain in left knee: Secondary | ICD-10-CM | POA: Diagnosis not present

## 2016-11-13 ENCOUNTER — Encounter: Payer: Self-pay | Admitting: Family Medicine

## 2016-11-14 DIAGNOSIS — M25562 Pain in left knee: Secondary | ICD-10-CM | POA: Diagnosis not present

## 2016-11-17 ENCOUNTER — Ambulatory Visit (INDEPENDENT_AMBULATORY_CARE_PROVIDER_SITE_OTHER): Payer: BLUE CROSS/BLUE SHIELD | Admitting: Family Medicine

## 2016-11-17 VITALS — BP 132/93 | HR 106 | Temp 99.1°F | Resp 17 | Ht 68.5 in | Wt 169.0 lb

## 2016-11-17 DIAGNOSIS — Z3042 Encounter for surveillance of injectable contraceptive: Secondary | ICD-10-CM | POA: Diagnosis not present

## 2016-11-17 MED ORDER — MEDROXYPROGESTERONE ACETATE 150 MG/ML IM SUSP
150.0000 mg | Freq: Once | INTRAMUSCULAR | Status: AC
  Administered 2016-11-17: 150 mg via INTRAMUSCULAR

## 2016-11-17 NOTE — Patient Instructions (Signed)
     IF you received an x-ray today, you will receive an invoice from [facility]. Please contact [facility] at [phone] with questions or concerns regarding your invoice.   IF you received labwork today, you will receive an invoice from [facility]. Please contact [facility] at [phone] with questions or concerns regarding your invoice.   Our billing staff will not be able to assist you with questions regarding bills from these companies.  You will be contacted with the lab results as soon as they are available. The fastest way to get your results is to activate your My Chart account. Instructions are located on the last page of this paperwork. If you have not heard from us regarding the results in 2 weeks, please contact this office.     

## 2016-11-19 DIAGNOSIS — M25562 Pain in left knee: Secondary | ICD-10-CM | POA: Diagnosis not present

## 2016-11-27 ENCOUNTER — Encounter (INDEPENDENT_AMBULATORY_CARE_PROVIDER_SITE_OTHER): Payer: Self-pay | Admitting: Orthopedic Surgery

## 2016-11-28 DIAGNOSIS — M25562 Pain in left knee: Secondary | ICD-10-CM | POA: Diagnosis not present

## 2016-11-28 MED ORDER — HYDROCODONE-ACETAMINOPHEN 5-325 MG PO TABS: ORAL_TABLET | ORAL | 0 refills | Status: DC

## 2016-11-28 NOTE — Telephone Encounter (Signed)
rx entered and printed

## 2016-11-28 NOTE — Telephone Encounter (Signed)
Ok to rf pls cla lthx

## 2016-12-04 DIAGNOSIS — N840 Polyp of corpus uteri: Secondary | ICD-10-CM | POA: Diagnosis not present

## 2016-12-12 DIAGNOSIS — M25562 Pain in left knee: Secondary | ICD-10-CM | POA: Diagnosis not present

## 2016-12-17 ENCOUNTER — Encounter (INDEPENDENT_AMBULATORY_CARE_PROVIDER_SITE_OTHER): Payer: Self-pay | Admitting: Orthopedic Surgery

## 2016-12-17 ENCOUNTER — Ambulatory Visit (INDEPENDENT_AMBULATORY_CARE_PROVIDER_SITE_OTHER): Payer: BLUE CROSS/BLUE SHIELD | Admitting: Orthopedic Surgery

## 2016-12-17 DIAGNOSIS — S83242D Other tear of medial meniscus, current injury, left knee, subsequent encounter: Secondary | ICD-10-CM

## 2016-12-17 DIAGNOSIS — S83512D Sprain of anterior cruciate ligament of left knee, subsequent encounter: Secondary | ICD-10-CM

## 2016-12-17 NOTE — Progress Notes (Signed)
No provider visit provided; review of chart:  Recent evaluation by PCP/Copland on 10/29/16 for abnormal uterine bleeding; referred to OB/GYN; appointment scheduled for 3/201/8 per Copland's note.  Pap 09/21/13 WNL.  Last Depo injection 08/23/16 thus within 90 day window.  Proceed with repeat Depo Provera injection. Tylar Amborn Paulita FujitaMartin Gilverto Dileonardo, M.D. [facility]  [facility] [address] [place], [place]  [address] [phone] phone [phone] fax

## 2016-12-19 ENCOUNTER — Ambulatory Visit (INDEPENDENT_AMBULATORY_CARE_PROVIDER_SITE_OTHER): Payer: BLUE CROSS/BLUE SHIELD | Admitting: Orthopedic Surgery

## 2016-12-19 DIAGNOSIS — M25562 Pain in left knee: Secondary | ICD-10-CM | POA: Diagnosis not present

## 2016-12-19 NOTE — Progress Notes (Signed)
Office Visit Note   Patient: Lisa [name]           Date of Birth: June 05, 1971           MRN: [id number] Visit Date: 12/17/2016 Requested by: Pearline Cables, MD [address] [place], [place] [address] PCP: Pearline Cables, MD  Subjective: Chief Complaint  Patient presents with  . Left Knee - Follow-up    HPI: Lisa [name] is a 46 year old patient who is now about 4 months out left knee anterior cruciate ligament reconstruction.  She is doing well.  She is in physical therapy doing exercises for strengthening.  She's had no symptomatically instability.  She does feel like her leg is getting stronger.              ROS: All systems reviewed are negative as they relate to the chief complaint within the history of present illness.  Patient denies  fevers or chills.   Assessment & Plan: Visit Diagnoses:  1. Rupture of anterior cruciate ligament of left knee, subsequent encounter   2. Acute medial meniscus tear of left knee, subsequent encounter     Plan: Impression is well-functioning and stable left knee.  Quad strength is improving.  Graft is stable.  I like her to continue to do therapy and transition to a home exercise program.  She is not doing anything to overly physical and the next few months.  I want to see her back as needed.  Follow-Up Instructions: No Follow-up on file.   Orders:  No orders of the defined types were placed in this encounter.  No orders of the defined types were placed in this encounter.     Procedures: No procedures performed   Clinical Data: No additional findings.  Objective: Vital Signs: There were no vitals taken for this visit.  Physical Exam:   Constitutional: Patient appears well-developed HEENT:  Head: Normocephalic Eyes:EOM are normal Neck: Normal range of motion Cardiovascular: Normal rate Pulmonary/chest: Effort normal Neurologic: Patient is alert Skin: Skin is warm Psychiatric: Patient has normal mood and  affect    Ortho Exam: Left knee exam demonstrates excellent range of motion no effusion she is stable to valgus stress at 0 and 30 indicating healing of that partial MCL tear.  Anterior cruciate ligament was stable no effusion is present in the knee still has about a centimeter of atrophy and a thigh left versus right.  There is no posterior lateral rotatory instability.  Specialty Comments:  No specialty comments available.  Imaging: No results found.   PMFS History: Patient Active Problem List   Diagnosis Date Noted  . Left anterior cruciate ligament tear 08/03/2016  . Acute medial meniscus tear of left knee 08/03/2016  . Acute lateral meniscus tear of left knee 08/03/2016  . Acute pain of left knee 07/26/2016  . History of abnormal cervical Pap smear 09/21/2013  . Leukocytopenia, unspecified 03/02/2013  . S/P hip replacement 02/06/2013  . Hyperlipidemia 10/13/2012  . HTN (hypertension) 10/13/2012   Past Medical History:  Diagnosis Date  . Anemia    hx of  . Anxiety 20 years ago  . Depression 20 years ago  . GERD (gastroesophageal reflux disease)   . Heart murmur   . Hyperlipidemia   . Hypertension    history of htn, no meds now  . Ulcer    x2    Family History  Problem Relation Age of Onset  . Adopted: Yes  Past Surgical History:  Procedure Laterality Date  . ANTERIOR CRUCIATE LIGAMENT REPAIR Left 09/04/2016   Procedure: LEFT KNEE MEDIAL COLLATERAL LIGAMENT REPAIR, ANTERIOR CRUCIATE LIGAMENT RECONSTRUCTION, MENISCAL DEBRIDEMENT VERSUS REPAIR;  Surgeon: Cammy CopaScott Gregory Dean, MD;  Location: [facility];  Service: Orthopedics;  Laterality: Left;  . ESOPHAGOGASTRODUODENOSCOPY ENDOSCOPY     at least 4  . LAPAROSCOPIC APPENDECTOMY N/A 02/23/2015   Procedure: APPENDECTOMY LAPAROSCOPIC;  Surgeon: Gaynelle AduEric Wilson, MD;  Location: [facility];  Service: General;  Laterality: N/A;  . TOTAL HIP ARTHROPLASTY Left 02/06/2013   Procedure: LEFT TOTAL HIP ARTHROPLASTY ANTERIOR APPROACH;   Surgeon: Kathryne Hitchhristopher Y Blackman, MD;  Location: [facility];  Service: Orthopedics;  Laterality: Left;   Social History   Occupational History  . Not on file.   Social History Main Topics  . Smoking status: Former Smoker    Packs/day: 0.25    Years: 20.00    Types: Cigarettes    Quit date: 08/13/2013  . Smokeless tobacco: Never Used  . Alcohol use 0.0 oz/week     Comment: few glasses of wine a week  . Drug use: No  . Sexual activity: No

## 2016-12-25 ENCOUNTER — Other Ambulatory Visit (INDEPENDENT_AMBULATORY_CARE_PROVIDER_SITE_OTHER): Payer: Self-pay | Admitting: Orthopedic Surgery

## 2016-12-25 ENCOUNTER — Encounter: Payer: Self-pay | Admitting: Family Medicine

## 2016-12-25 DIAGNOSIS — M25562 Pain in left knee: Secondary | ICD-10-CM | POA: Diagnosis not present

## 2016-12-25 MED ORDER — TRAZODONE HCL 50 MG PO TABS: 25.0000 mg | ORAL_TABLET | Freq: Every evening | ORAL | 2 refills | Status: DC | PRN

## 2016-12-25 NOTE — Telephone Encounter (Signed)
y

## 2016-12-26 MED ORDER — METHOCARBAMOL 500 MG PO TABS: 500.0000 mg | ORAL_TABLET | Freq: Three times a day (TID) | ORAL | 0 refills | Status: DC | PRN

## 2016-12-29 ENCOUNTER — Other Ambulatory Visit: Payer: Self-pay | Admitting: Family Medicine

## 2016-12-29 DIAGNOSIS — G4700 Insomnia, unspecified: Secondary | ICD-10-CM

## 2016-12-31 ENCOUNTER — Other Ambulatory Visit: Payer: Self-pay | Admitting: Emergency Medicine

## 2016-12-31 MED ORDER — TRAZODONE HCL 50 MG PO TABS: 25.0000 mg | ORAL_TABLET | Freq: Every evening | ORAL | 2 refills | Status: DC | PRN

## 2016-12-31 NOTE — Telephone Encounter (Signed)
Requesting: traZODone (DESYREL) 50 MG tablet Contract UDS Last OV: 10/29/16 Last Refill: 12/25/16  Please Advise

## 2017-01-01 ENCOUNTER — Other Ambulatory Visit: Payer: Self-pay | Admitting: Emergency Medicine

## 2017-01-01 NOTE — Telephone Encounter (Signed)
Requesting: traZODone (DESYREL) 50 MG tablet Contract UDS Last OV: 10/29/16 Last Refill: 12/31/16  Please Advise

## 2017-01-02 DIAGNOSIS — M25562 Pain in left knee: Secondary | ICD-10-CM | POA: Diagnosis not present

## 2017-01-10 DIAGNOSIS — M25562 Pain in left knee: Secondary | ICD-10-CM | POA: Diagnosis not present

## 2017-01-16 ENCOUNTER — Other Ambulatory Visit (INDEPENDENT_AMBULATORY_CARE_PROVIDER_SITE_OTHER): Payer: Self-pay

## 2017-01-16 ENCOUNTER — Telehealth (INDEPENDENT_AMBULATORY_CARE_PROVIDER_SITE_OTHER): Payer: Self-pay | Admitting: Radiology

## 2017-01-16 ENCOUNTER — Encounter (INDEPENDENT_AMBULATORY_CARE_PROVIDER_SITE_OTHER): Payer: Self-pay | Admitting: Orthopedic Surgery

## 2017-01-16 MED ORDER — HYDROCODONE-ACETAMINOPHEN 5-325 MG PO TABS: ORAL_TABLET | ORAL | 0 refills | Status: DC

## 2017-01-16 NOTE — Telephone Encounter (Signed)
Patient calling this morning with severe lower back pain. Ongoing the past 4 days. She is unable to work due to her pain and has missed 1.5 days of work already. I scheduled for first available 01/28/17 at 245 with GD. She is requesting refill of hydrocodone to get her through until her appointment. Can you please advise?

## 2017-01-16 NOTE — Telephone Encounter (Signed)
Ok to rf pls cla lthx

## 2017-01-16 NOTE — Telephone Encounter (Signed)
Can be picked up at front desk. Patient aware.

## 2017-01-16 NOTE — Telephone Encounter (Signed)
See note.  Please advise. Thanks.

## 2017-01-16 NOTE — Telephone Encounter (Signed)
Patient calling this morning with severe lower back pain. Ongoing the past 4 days. She is unable to work due to her pain and has missed 1.5 days of work already. I scheduled for first available 01/28/17 at 245 with GD. Advised her I would keep an eye on the schedule and call her if there is cancellations at [phone].

## 2017-01-23 ENCOUNTER — Ambulatory Visit (INDEPENDENT_AMBULATORY_CARE_PROVIDER_SITE_OTHER): Payer: BLUE CROSS/BLUE SHIELD | Admitting: Family Medicine

## 2017-01-23 VITALS — BP 126/84 | HR 95 | Temp 98.2°F | Ht 68.5 in | Wt 171.8 lb

## 2017-01-23 DIAGNOSIS — M545 Low back pain: Secondary | ICD-10-CM | POA: Diagnosis not present

## 2017-01-23 DIAGNOSIS — Z130 Encounter for screening for diseases of the blood and blood-forming organs and certain disorders involving the immune mechanism: Secondary | ICD-10-CM

## 2017-01-23 DIAGNOSIS — R197 Diarrhea, unspecified: Secondary | ICD-10-CM

## 2017-01-23 DIAGNOSIS — E784 Other hyperlipidemia: Secondary | ICD-10-CM | POA: Diagnosis not present

## 2017-01-23 DIAGNOSIS — M546 Pain in thoracic spine: Secondary | ICD-10-CM

## 2017-01-23 DIAGNOSIS — E785 Hyperlipidemia, unspecified: Secondary | ICD-10-CM

## 2017-01-23 DIAGNOSIS — E7849 Other hyperlipidemia: Secondary | ICD-10-CM

## 2017-01-23 DIAGNOSIS — Z131 Encounter for screening for diabetes mellitus: Secondary | ICD-10-CM

## 2017-01-23 LAB — CBC
HEMATOCRIT: 38.7 % (ref 36.0–46.0)
HEMOGLOBIN: 12.6 g/dL (ref 12.0–15.0)
MCHC: 32.7 g/dL (ref 30.0–36.0)
MCV: 91.8 fl (ref 78.0–100.0)
Platelets: 233 10*3/uL (ref 150.0–400.0)
RBC: 4.22 Mil/uL (ref 3.87–5.11)
RDW: 12.8 % (ref 11.5–15.5)
WBC: 4.2 10*3/uL (ref 4.0–10.5)

## 2017-01-23 LAB — COMPREHENSIVE METABOLIC PANEL
ALBUMIN: 4.5 g/dL (ref 3.5–5.2)
ALT: 31 U/L (ref 0–35)
AST: 23 U/L (ref 0–37)
Alkaline Phosphatase: 47 U/L (ref 39–117)
BUN: 12 mg/dL (ref 6–23)
CHLORIDE: 105 meq/L (ref 96–112)
CO2: 24 mEq/L (ref 19–32)
Calcium: 9.4 mg/dL (ref 8.4–10.5)
Creatinine, Ser: 0.52 mg/dL (ref 0.40–1.20)
GFR: 135.88 mL/min (ref >60.00)
Glucose, Bld: 125 mg/dL — ABNORMAL HIGH (ref 70–99)
POTASSIUM: 4.6 meq/L (ref 3.5–5.1)
SODIUM: 137 meq/L (ref 135–145)
Total Bilirubin: 0.6 mg/dL (ref 0.2–1.2)
Total Protein: 7.2 g/dL (ref 6.0–8.3)

## 2017-01-23 LAB — LIPID PANEL
CHOL/HDL RATIO: 3
CHOLESTEROL: 170 mg/dL (ref 0–200)
HDL: 67.2 mg/dL (ref >39.00)
NonHDL: 102.45
Triglycerides: 213 mg/dL — ABNORMAL HIGH (ref 0.0–149.0)
VLDL: 42.6 mg/dL — AB (ref 0.0–40.0)

## 2017-01-23 LAB — LDL CHOLESTEROL, DIRECT: LDL DIRECT: 75 mg/dL

## 2017-01-23 LAB — HEMOGLOBIN A1C: Hgb A1c MFr Bld: 6.1 % (ref 4.6–6.5)

## 2017-01-23 MED ORDER — SIMVASTATIN 20 MG PO TABS: 20.0000 mg | ORAL_TABLET | Freq: Every evening | ORAL | 3 refills | Status: DC

## 2017-01-23 MED ORDER — DIPHENOXYLATE-ATROPINE 2.5-0.025 MG PO TABS: 1.0000 | ORAL_TABLET | Freq: Four times a day (QID) | ORAL | 1 refills | Status: DC | PRN

## 2017-01-23 NOTE — Progress Notes (Addendum)
[facility] [place] [address] [place], [place] [address] [phone] [phone]  Date:  01/23/2017   Name:  Lisa PonderKim M [name]   DOB:  08/12/1971   MRN:  [id number]  PCP:  Pearline Cablesopland, Navina Wohlers C, MD    Chief Complaint: Medication Refill (Pt here for med refill on Diphenoxylate-Atropine and would like to dicuss ongoing back pain. )   History of Present Illness:  Lisa PonderKim M [name] is a 46 y.o. very pleasant female patient who presents with the following:  She had a left ACL/ meniscal repair back in January per Dr. August Saucerean- this is healing well for her.  She is doing PT some, and is exercising at the gym as well.   BP Readings from Last 3 Encounters:  01/23/17 126/84  11/17/16 (!) 132/93  10/29/16 110/80   Wt Readings from Last 3 Encounters:  01/23/17 171 lb 12.8 oz (77.9 kg)  11/17/16 169 lb (76.7 kg)  10/29/16 166 lb 9.6 oz (75.6 kg)   She does need a refill of her lomotil today- uses this prn for persistent diarrhea  Here today with concern of back pain. She notes that if she is standing and then goes to sit, she will have pain in her lower back.  Sometimes then she is not able to get up from sitting due to pain.  She does not have pain down her legs, no numbness or weakness.   She will take hydrocodone on occasion- this and laying on her side will help relieve the pain. She did have an MRI of her lumbar spine 12/16 as below  IMPRESSION: 1. Symptomatic level suspected to be L3-L4 where there is a right extra-foraminal/far lateral disc extrusion and sequestered disc fragment strongly suspected. Query right L3 radiculitis. 2. Chronic disc and endplate degeneration at L4-L5 and L5-S1. At each level the left lateral recess is most affected (descending left L5 and S1 nerve roots).  She is fasting today for labs She is seeing a new man and the relationship is off to a good staart-they have been together for about 3 months.  He is a type 1 diabetic.   She has a few questions about what to do if he has any sx of hypoglycemia Patient Active Problem List   Diagnosis Date Noted  . Left anterior cruciate ligament tear 08/03/2016  . Acute medial meniscus tear of left knee 08/03/2016  . Acute lateral meniscus tear of left knee 08/03/2016  . History of abnormal cervical Pap smear 09/21/2013  . Leukocytopenia, unspecified 03/02/2013  . S/P hip replacement 02/06/2013  . Hyperlipidemia 10/13/2012  . HTN (hypertension) 10/13/2012    Past Medical History:  Diagnosis Date  . Anemia    hx of  . Anxiety 20 years ago  . Depression 20 years ago  . GERD (gastroesophageal reflux disease)   . Heart murmur   . Hyperlipidemia   . Hypertension    history of htn, no meds now  . Ulcer    x2    Past Surgical History:  Procedure Laterality Date  . ANTERIOR CRUCIATE LIGAMENT REPAIR Left 09/04/2016   Procedure: LEFT KNEE MEDIAL COLLATERAL LIGAMENT REPAIR, ANTERIOR CRUCIATE LIGAMENT RECONSTRUCTION, MENISCAL DEBRIDEMENT VERSUS REPAIR;  Surgeon: Cammy CopaScott Gregory Dean, MD;  Location: [facility];  Service: Orthopedics;  Laterality: Left;  . ESOPHAGOGASTRODUODENOSCOPY ENDOSCOPY     at least 4  . LAPAROSCOPIC APPENDECTOMY N/A 02/23/2015   Procedure: APPENDECTOMY LAPAROSCOPIC;  Surgeon: Gaynelle AduEric Wilson, MD;  Location: [facility];  Service: General;  Laterality: N/A;  . TOTAL HIP ARTHROPLASTY Left 02/06/2013   Procedure: LEFT TOTAL HIP ARTHROPLASTY ANTERIOR APPROACH;  Surgeon: Kathryne Hitch, MD;  Location: [facility];  Service: Orthopedics;  Laterality: Left;    Social History  Substance Use Topics  . Smoking status: Former Smoker    Packs/day: 0.25    Years: 20.00    Types: Cigarettes    Quit date: 08/13/2013  . Smokeless tobacco: Never Used  . Alcohol use 0.0 oz/week     Comment: few glasses of wine a week    Family History  Problem Relation Age of Onset  . Adopted: Yes    Allergies  Allergen Reactions  . Nsaids Other (See Comments)    ulcers     Medication list has been reviewed and updated.  Current Outpatient Prescriptions on File Prior to Visit  Medication Sig Dispense Refill  . HYDROcodone-acetaminophen (NORCO/VICODIN) 5-325 MG tablet 1 po BID prn pain 30 tablet 0  . MAGNESIUM PO Take 1 tablet by mouth daily.    . methocarbamol (ROBAXIN) 500 MG tablet Take 1 tablet (500 mg total) by mouth every 8 (eight) hours as needed for muscle spasms. 30 tablet 0  . ondansetron (ZOFRAN) 4 MG tablet Take 1 tablet (4 mg total) by mouth every 8 (eight) hours as needed for nausea or vomiting. 30 tablet 0  . pantoprazole (PROTONIX) 40 MG tablet Take 40 mg by mouth every morning.    Marland Kitchen POTASSIUM PO Take 1 tablet by mouth daily.    . SUMAtriptan (IMITREX) 50 MG tablet Take 1 tablet (50 mg total) by mouth every 2 (two) hours as needed for migraine. Max 100 mg in 24 hours 10 tablet 3  . traZODone (DESYREL) 50 MG tablet Take 0.5-1 tablets (25-50 mg total) by mouth at bedtime as needed for sleep. May take up to 2 tablets if needed 180 tablet 2   No current facility-administered medications on file prior to visit.     Review of Systems:  As per HPI- otherwise negative.   Physical Examination: Vitals:   01/23/17 0854  BP: 126/84  Pulse: 95  Temp: 98.2 F (36.8 C)   Vitals:   01/23/17 0854  Weight: 171 lb 12.8 oz (77.9 kg)  Height: 5' 8.5" (1.74 m)   Body mass index is 25.74 kg/m. Ideal Body Weight: Weight in (lb) to have BMI = 25: 166.5  GEN: WDWN, NAD, Non-toxic, A & O x 3, looks well HEENT: Atraumatic, Normocephalic. Neck supple. No masses, No LAD. Ears and Nose: No external deformity. CV: RRR, No M/G/R. No JVD. No thrill. No extra heart sounds. PULM: CTA B, no wheezes, crackles, rhonchi. No retractions. No resp. distress. No accessory muscle use. ABD: S, NT, ND EXTR: No c/c/e NEURO Normal gait.  PSYCH: Normally interactive. Conversant. Not depressed or anxious appearing.  Calm demeanor.  Normal strength and sensation of her  BLE, normal patellar reflex on the right- left knee is post- op Negative straight leg raise bilaterally She notes pain in the mid back, but not necessarily over the spine, at the thoracolumbar junction.  Normal ROM but she has discomfort with full flexion or extension   Assessment and Plan: Screening for deficiency anemia - Plan: CBC  Hyperlipidemia, unspecified hyperlipidemia type - Plan: Lipid panel  Diarrhea, unspecified type - Plan: diphenoxylate-atropine (LOMOTIL) 2.5-0.025 MG tablet  Screening for diabetes mellitus - Plan: Comprehensive metabolic panel, Hemoglobin A1c  Other hyperlipidemia - Plan: simvastatin (ZOCOR)  20 MG tablet  Back pain of thoracolumbar region  She is known to have an abnl spine MRI.  She plans to follow-up with one of the spine doctors at [facility] ortho Refilled her medications and obtain labs as above She will let me know if any concerns in the meantime Will plan further follow- up pending labs. She did see GYN for change in her menstrual bleeding pattern- she had a polyp, no sign of endometrial cancer  Signed Abbe Amsterdam, MD Received her labs  Results for orders placed or performed in visit on 01/23/17  CBC  Result Value Ref Range   WBC 4.2 4.0 - 10.5 K/uL   RBC 4.22 3.87 - 5.11 Mil/uL   Platelets 233.0 150.0 - 400.0 K/uL   Hemoglobin 12.6 12.0 - 15.0 g/dL   HCT 16.1 09.6 - 04.5 %   MCV 91.8 78.0 - 100.0 fl   MCHC 32.7 30.0 - 36.0 g/dL   RDW 40.9 81.1 - 91.4 %  Comprehensive metabolic panel  Result Value Ref Range   Sodium 137 135 - 145 mEq/L   Potassium 4.6 3.5 - 5.1 mEq/L   Chloride 105 96 - 112 mEq/L   CO2 24 19 - 32 mEq/L   Glucose, Bld 125 (H) 70 - 99 mg/dL   BUN 12 6 - 23 mg/dL   Creatinine, Ser 7.82 0.40 - 1.20 mg/dL   Total Bilirubin 0.6 0.2 - 1.2 mg/dL   Alkaline Phosphatase 47 39 - 117 U/L   AST 23 0 - 37 U/L   ALT 31 0 - 35 U/L   Total Protein 7.2 6.0 - 8.3 g/dL   Albumin 4.5 3.5 - 5.2 g/dL   Calcium 9.4 8.4 - 95.6  mg/dL   GFR 213.08 >65.78 mL/min  Lipid panel  Result Value Ref Range   Cholesterol 170 0 - 200 mg/dL   Triglycerides 469.6 (H) 0.0 - 149.0 mg/dL   HDL 29.52 >84.13 mg/dL   VLDL 24.4 (H) 0.0 - 01.0 mg/dL   Total CHOL/HDL Ratio 3    NonHDL 102.45   Hemoglobin A1c  Result Value Ref Range   Hgb A1c MFr Bld 6.1 4.6 - 6.5 %  LDL cholesterol, direct  Result Value Ref Range   Direct LDL 75.0 mg/dL

## 2017-01-23 NOTE — Patient Instructions (Signed)
Dr .Ophelia CharterYates or Otelia SergeantNitka at [facility] ortho can help with your back; you can call them and schedule at your convenience.

## 2017-01-24 ENCOUNTER — Encounter: Payer: Self-pay | Admitting: Family Medicine

## 2017-01-28 ENCOUNTER — Ambulatory Visit (INDEPENDENT_AMBULATORY_CARE_PROVIDER_SITE_OTHER): Payer: BLUE CROSS/BLUE SHIELD | Admitting: Orthopedic Surgery

## 2017-01-28 ENCOUNTER — Encounter (INDEPENDENT_AMBULATORY_CARE_PROVIDER_SITE_OTHER): Payer: Self-pay | Admitting: Orthopedic Surgery

## 2017-01-28 DIAGNOSIS — M545 Low back pain, unspecified: Secondary | ICD-10-CM

## 2017-01-28 MED ORDER — METHOCARBAMOL 500 MG PO TABS: 500.0000 mg | ORAL_TABLET | Freq: Three times a day (TID) | ORAL | 0 refills | Status: DC | PRN

## 2017-01-28 MED ORDER — HYDROCODONE-ACETAMINOPHEN 5-325 MG PO TABS: ORAL_TABLET | ORAL | 0 refills | Status: DC

## 2017-01-31 NOTE — Progress Notes (Signed)
Office Visit Note   Patient: Lisa [name]           Date of Birth: 04/19/1971           MRN: [id number] Visit Date: 01/28/2017 Requested by: Pearline Cablesopland, Jessica C, MD [address] [place], [place] [address] PCP: Pearline Cablesopland, Jessica C, MD  Subjective: Chief Complaint  Patient presents with  . Lower Back - Pain    HPI: Lisa [name] is a 46 year old patient with worsening low back pain for the past 2 weeks.  The pain comes and goes.  She is unable to stand for long periods of time without severe pain.  She's had to take some time off of work because of this back pain.  Denies much in way back pain and numbness and tingling.  She had an MRI scan a year and a half ago of the lumbar spine which showed at L3-4 on the right extraforaminal H&P.  She has missed some work because of this.  Only relief is lying on the side with taking pain medicines.  She's had no interval injury.  She cannot take anti-inflammatories because of stomach issue.  She had been in physical therapy for her knee and she is doing well with that.  She is to be able to walk for 40 minutes but now she can only walk 10 minutes.  All of her symptoms are in the lower back region.              ROS: All systems reviewed are negative as they relate to the chief complaint within the history of present illness.  Patient denies  fevers or chills.   Assessment & Plan: Visit Diagnoses:  1. Midline low back pain without sciatica, unspecified chronicity     Plan: Impression is low back pain probable worsening of this disc herniation at L3-4.  I would like to send her to Dr. Alvester MorinNewton for epidural steroid injection along with refill of her Norco and Robaxin.  I'll see her back as needed.  Her knee is doing well.  Follow-Up Instructions: No Follow-up on file.   Orders:  Orders Placed This Encounter  Procedures  . Ambulatory referral to Physical Medicine Rehab   Meds ordered this encounter  Medications  . HYDROcodone-acetaminophen  (NORCO/VICODIN) 5-325 MG tablet    Sig: 1 po BID prn pain    Dispense:  30 tablet    Refill:  0  . methocarbamol (ROBAXIN) 500 MG tablet    Sig: Take 1 tablet (500 mg total) by mouth every 8 (eight) hours as needed for muscle spasms.    Dispense:  30 tablet    Refill:  0      Procedures: No procedures performed   Clinical Data: No additional findings.  Objective: Vital Signs: There were no vitals taken for this visit.  Physical Exam:   Constitutional: Patient appears well-developed HEENT:  Head: Normocephalic Eyes:EOM are normal Neck: Normal range of motion Cardiovascular: Normal rate Pulmonary/chest: Effort normal Neurologic: Patient is alert Skin: Skin is warm Psychiatric: Patient has normal mood and affect    Ortho Exam: Orthopedic exam demonstrates no nerve retention signs some pain with forward lateral bending seizures L1 S1 bilaterally palpable pedal pulses negative Babinski negative clonus no other masses lymph adenopathy or skin changes noted in the back region.  She does have some tenderness to palpation along the paraspinal muscles on the right side compared to the left  Specialty Comments:  No specialty  comments available.  Imaging: No results found.   PMFS History: Patient Active Problem List   Diagnosis Date Noted  . Left anterior cruciate ligament tear 08/03/2016  . Acute medial meniscus tear of left knee 08/03/2016  . Acute lateral meniscus tear of left knee 08/03/2016  . History of abnormal cervical Pap smear 09/21/2013  . Leukocytopenia, unspecified 03/02/2013  . S/P hip replacement 02/06/2013  . Hyperlipidemia 10/13/2012  . HTN (hypertension) 10/13/2012   Past Medical History:  Diagnosis Date  . Anemia    hx of  . Anxiety 20 years ago  . Depression 20 years ago  . GERD (gastroesophageal reflux disease)   . Heart murmur   . Hyperlipidemia   . Hypertension    history of htn, no meds now  . Ulcer    x2    Family History  Problem  Relation Age of Onset  . Adopted: Yes    Past Surgical History:  Procedure Laterality Date  . ANTERIOR CRUCIATE LIGAMENT REPAIR Left 09/04/2016   Procedure: LEFT KNEE MEDIAL COLLATERAL LIGAMENT REPAIR, ANTERIOR CRUCIATE LIGAMENT RECONSTRUCTION, MENISCAL DEBRIDEMENT VERSUS REPAIR;  Surgeon: Cammy Copa, MD;  Location: [facility];  Service: Orthopedics;  Laterality: Left;  . ESOPHAGOGASTRODUODENOSCOPY ENDOSCOPY     at least 4  . LAPAROSCOPIC APPENDECTOMY N/A 02/23/2015   Procedure: APPENDECTOMY LAPAROSCOPIC;  Surgeon: Gaynelle Adu, MD;  Location: [facility];  Service: General;  Laterality: N/A;  . TOTAL HIP ARTHROPLASTY Left 02/06/2013   Procedure: LEFT TOTAL HIP ARTHROPLASTY ANTERIOR APPROACH;  Surgeon: Kathryne Hitch, MD;  Location: [facility];  Service: Orthopedics;  Laterality: Left;   Social History   Occupational History  . Not on file.   Social History Main Topics  . Smoking status: Former Smoker    Packs/day: 0.25    Years: 20.00    Types: Cigarettes    Quit date: 08/13/2013  . Smokeless tobacco: Never Used  . Alcohol use 0.0 oz/week     Comment: few glasses of wine a week  . Drug use: No  . Sexual activity: No

## 2017-02-14 ENCOUNTER — Ambulatory Visit (INDEPENDENT_AMBULATORY_CARE_PROVIDER_SITE_OTHER): Payer: BLUE CROSS/BLUE SHIELD | Admitting: Family Medicine

## 2017-02-14 ENCOUNTER — Other Ambulatory Visit (HOSPITAL_COMMUNITY)
Admission: RE | Admit: 2017-02-14 | Discharge: 2017-02-14 | Disposition: A | Discharge status: Home / Self Care | Payer: BLUE CROSS/BLUE SHIELD | Source: Ambulatory Visit | Attending: Family Medicine | Admitting: Family Medicine

## 2017-02-14 VITALS — BP 109/80 | HR 93 | Temp 98.6°F | Ht 67.5 in | Wt 173.6 lb

## 2017-02-14 DIAGNOSIS — Z124 Encounter for screening for malignant neoplasm of cervix: Secondary | ICD-10-CM | POA: Diagnosis not present

## 2017-02-14 DIAGNOSIS — R7303 Prediabetes: Secondary | ICD-10-CM

## 2017-02-14 DIAGNOSIS — Z0000 Encounter for general adult medical examination without abnormal findings: Secondary | ICD-10-CM | POA: Diagnosis not present

## 2017-02-14 DIAGNOSIS — R87610 Atypical squamous cells of undetermined significance on cytologic smear of cervix (ASC-US): Secondary | ICD-10-CM | POA: Diagnosis not present

## 2017-02-14 DIAGNOSIS — Z3042 Encounter for surveillance of injectable contraceptive: Secondary | ICD-10-CM | POA: Diagnosis not present

## 2017-02-14 DIAGNOSIS — Z01419 Encounter for gynecological examination (general) (routine) without abnormal findings: Secondary | ICD-10-CM | POA: Diagnosis not present

## 2017-02-14 DIAGNOSIS — B379 Candidiasis, unspecified: Secondary | ICD-10-CM | POA: Diagnosis not present

## 2017-02-14 DIAGNOSIS — E119 Type 2 diabetes mellitus without complications: Secondary | ICD-10-CM | POA: Insufficient documentation

## 2017-02-14 MED ORDER — MEDROXYPROGESTERONE ACETATE 150 MG/ML IM SUSP: 150.0000 mg | Freq: Once | INTRAMUSCULAR | 0 refills | Status: DC

## 2017-02-14 MED ORDER — MEDROXYPROGESTERONE ACETATE 150 MG/ML IM SUSP
150.0000 mg | Freq: Once | INTRAMUSCULAR | Status: AC
  Administered 2017-02-14: 150 mg via INTRAMUSCULAR

## 2017-02-14 NOTE — Patient Instructions (Signed)
It was great to see you today as always!   You got your depo shot today I'll be in touch with your pap asap Have a wonderful time on your cruise!

## 2017-02-14 NOTE — Progress Notes (Addendum)
[facility] [address], [address] [place], [place] [address] [phone] [phone]  Date:  02/14/2017   Name:  Lisa [name]   DOB:  10/03/1970   MRN:  [id number]  PCP:  Pearline Cablesopland, Idonia Zollinger C, MD    Chief Complaint: Annual Exam (Pt here for CPE with PAP. )   History of Present Illness:  Lisa PonderKim M [name] is a 46 y.o. very pleasant female patient who presents with the following:  Here today for her pap / CPE today Last pap 2015- negative mammo about one year ago Labs done last month - all looked ok, already discussed with pt  She is getting a cortisone shot on Monday for her back- if this does not help thy will get an MRI.   She is seeing NSG and also PT  11/17/16- [facility] is now- ok to give today  She is otherwise feeling well Still dating same man- notes that this relationship is a positive thing in her life.  Her nearly 44 yo daughter is also doing well, going [facility] [employer] [facility] [employer]  Declines STI screening today    Patient Active Problem List   Diagnosis Date Noted  . Left anterior cruciate ligament tear 08/03/2016  . Acute medial meniscus tear of left knee 08/03/2016  . Acute lateral meniscus tear of left knee 08/03/2016  . History of abnormal cervical Pap smear 09/21/2013  . Leukocytopenia, unspecified 03/02/2013  . S/P hip replacement 02/06/2013  . Hyperlipidemia 10/13/2012  . HTN (hypertension) 10/13/2012    Past Medical History:  Diagnosis Date  . Anemia    hx of  . Anxiety 20 years ago  . Depression 20 years ago  . GERD (gastroesophageal reflux disease)   . Heart murmur   . Hyperlipidemia   . Hypertension    history of htn, no meds now  . Ulcer    x2    Past Surgical History:  Procedure Laterality Date  . ANTERIOR CRUCIATE LIGAMENT REPAIR Left 09/04/2016   Procedure: LEFT KNEE MEDIAL COLLATERAL LIGAMENT REPAIR, ANTERIOR CRUCIATE LIGAMENT RECONSTRUCTION, MENISCAL DEBRIDEMENT VERSUS REPAIR;   Surgeon: Cammy CopaScott Gregory Dean, MD;  Location: [facility] OR;  Service: Orthopedics;  Laterality: Left;  . ESOPHAGOGASTRODUODENOSCOPY ENDOSCOPY     at least 4  . LAPAROSCOPIC APPENDECTOMY N/A 02/23/2015   Procedure: APPENDECTOMY LAPAROSCOPIC;  Surgeon: Gaynelle AduEric Wilson, MD;  Location: [facility];  Service: General;  Laterality: N/A;  . TOTAL HIP ARTHROPLASTY Left 02/06/2013   Procedure: LEFT TOTAL HIP ARTHROPLASTY ANTERIOR APPROACH;  Surgeon: Kathryne Hitchhristopher Y Blackman, MD;  Location: [facility];  Service: Orthopedics;  Laterality: Left;    Social History  Substance Use Topics  . Smoking status: Former Smoker    Packs/day: 0.25    Years: 20.00    Types: Cigarettes    Quit date: 08/13/2013  . Smokeless tobacco: Never Used  . Alcohol use 0.0 oz/week     Comment: few glasses of wine a week    Family History  Problem Relation Age of Onset  . Adopted: Yes    Allergies  Allergen Reactions  . Nsaids Other (See Comments)    ulcers    Medication list has been reviewed and updated.  Current Outpatient Prescriptions on File Prior to Visit  Medication Sig Dispense Refill  . diphenoxylate-atropine (LOMOTIL) 2.5-0.025 MG tablet Take 1 tablet by mouth 4 (four) times daily as needed for diarrhea or loose stools. 60 tablet 1  .  HYDROcodone-acetaminophen (NORCO/VICODIN) 5-325 MG tablet 1 po BID prn pain 30 tablet 0  . MAGNESIUM PO Take 1 tablet by mouth daily.    . methocarbamol (ROBAXIN) 500 MG tablet Take 1 tablet (500 mg total) by mouth every 8 (eight) hours as needed for muscle spasms. 30 tablet 0  . ondansetron (ZOFRAN) 4 MG tablet Take 1 tablet (4 mg total) by mouth every 8 (eight) hours as needed for nausea or vomiting. 30 tablet 0  . pantoprazole (PROTONIX) 40 MG tablet Take 40 mg by mouth every morning.    Marland Kitchen POTASSIUM PO Take 1 tablet by mouth daily.    . simvastatin (ZOCOR) 20 MG tablet Take 1 tablet (20 mg total) by mouth every evening. 90 tablet 3  . SUMAtriptan (IMITREX) 50 MG tablet Take 1 tablet (50 mg  total) by mouth every 2 (two) hours as needed for migraine. Max 100 mg in 24 hours 10 tablet 3  . traZODone (DESYREL) 50 MG tablet Take 0.5-1 tablets (25-50 mg total) by mouth at bedtime as needed for sleep. May take up to 2 tablets if needed 180 tablet 2   No current facility-administered medications on file prior to visit.     Review of Systems:  As per HPI- otherwise negative.   Physical Examination: Vitals:   02/14/17 1719  BP: 109/80  Pulse: 93  Temp: 98.6 F (37 C)   Vitals:   02/14/17 1719  Weight: 173 lb 9.6 oz (78.7 kg)  Height: 5' 7.5" (1.715 m)   Body mass index is 26.79 kg/m. Ideal Body Weight: Weight in (lb) to have BMI = 25: 161.7  GEN: WDWN, NAD, Non-toxic, A & O x 3 HEENT: Atraumatic, Normocephalic. Neck supple. No masses, No LAD. Ears and Nose: No external deformity. CV: RRR, No M/G/R. No JVD. No thrill. No extra heart sounds. PULM: CTA B, no wheezes, crackles, rhonchi. No retractions. No resp. distress. No accessory muscle use. ABD: S, NT, ND, +BS. No rebound. No HSM. EXTR: No c/c/e NEURO Normal gait.  PSYCH: Normally interactive. Conversant. Not depressed or anxious appearing.  Calm demeanor.  Breast: normal exam, no masses/ dimpling/ discharge Pelvic: normal, no vaginal lesions or discharge. Uterus normal, no CMT, no adnexal tendereness or masses  Assessment and Plan: Screening for cervical cancer - Plan: Cytology - PAP  Depo-Provera contraceptive status  Pap today- will contact pt with results asap Also depo shot as she is in her window  Signed Abbe Amsterdam, MD  Received her pap 7/11- HPV positive ASCUS.  Will need to see GYN for likely colpo.  Also needs diflucan for yeast infection.  Message to pt  Results for orders placed or performed in visit on 02/14/17  Cytology - PAP  Result Value Ref Range   Adequacy (A)     Satisfactory for evaluation  endocervical/transformation zone component PRESENT.   Diagnosis (A)     ATYPICAL SQUAMOUS  CELLS OF UNDETERMINED SIGNIFICANCE (ASC-US).   Diagnosis (A)     FUNGAL ORGANISMS PRESENT CONSISTENT WITH CANDIDA SPP.   HPV DETECTED (A)    Material Submitted CervicoVaginal Pap [ThinPrep Imaged] (A)

## 2017-02-18 ENCOUNTER — Ambulatory Visit (INDEPENDENT_AMBULATORY_CARE_PROVIDER_SITE_OTHER): Payer: BLUE CROSS/BLUE SHIELD

## 2017-02-18 ENCOUNTER — Ambulatory Visit (INDEPENDENT_AMBULATORY_CARE_PROVIDER_SITE_OTHER): Payer: BLUE CROSS/BLUE SHIELD | Admitting: Physical Medicine and Rehabilitation

## 2017-02-18 ENCOUNTER — Encounter (INDEPENDENT_AMBULATORY_CARE_PROVIDER_SITE_OTHER): Payer: Self-pay | Admitting: Physical Medicine and Rehabilitation

## 2017-02-18 VITALS — BP 123/94 | HR 91

## 2017-02-18 DIAGNOSIS — M545 Low back pain, unspecified: Secondary | ICD-10-CM

## 2017-02-18 DIAGNOSIS — M47816 Spondylosis without myelopathy or radiculopathy, lumbar region: Secondary | ICD-10-CM

## 2017-02-18 DIAGNOSIS — M5116 Intervertebral disc disorders with radiculopathy, lumbar region: Secondary | ICD-10-CM

## 2017-02-18 DIAGNOSIS — G8929 Other chronic pain: Secondary | ICD-10-CM

## 2017-02-18 MED ORDER — METHYLPREDNISOLONE ACETATE 80 MG/ML IJ SUSP
80.0000 mg | Freq: Once | INTRAMUSCULAR | Status: AC
  Administered 2017-02-18: 80 mg

## 2017-02-18 MED ORDER — LIDOCAINE HCL (PF) 1 % IJ SOLN
2.0000 mL | Freq: Once | INTRAMUSCULAR | Status: AC
  Administered 2017-02-18: 2 mL

## 2017-02-18 NOTE — Progress Notes (Deleted)
   Lisa PonderKim M Pha - 46 y.o. female MRN [id number]  Date of birth: 10/25/1970  Office Visit Note: Visit Date: 02/18/2017 PCP: Pearline Cablesopland, Jessica C, MD Referred by: Pearline Cablesopland, Jessica C, MD  Subjective: Chief Complaint  Patient presents with  . Lower Back - Pain   HPI:      ROS Otherwise per HPI.  Assessment & Plan: Visit Diagnoses: No diagnosis found.  Plan: No additional findings.   Meds & Orders: No orders of the defined types were placed in this encounter.  No orders of the defined types were placed in this encounter.   Follow-up: No Follow-up on file.   Procedures: No procedures performed  No notes on file   Clinical History: No specialty comments available.  She reports that she quit smoking about 3 years ago. Her smoking use included Cigarettes. She has a 5.00 pack-year smoking history. She has never used smokeless tobacco.  Recent Labs  01/23/17 0950  HGBA1C 6.1    Objective:  VS:  HT:    WT:   BMI:     BP:   HR: bpm  TEMP: ( )  RESP:  Physical Exam  Ortho Exam Imaging: No results found.  Past Medical/Family/Surgical/Social History: Medications & Allergies reviewed per EMR Patient Active Problem List   Diagnosis Date Noted  . Pre-diabetes 02/14/2017  . Left anterior cruciate ligament tear 08/03/2016  . Acute medial meniscus tear of left knee 08/03/2016  . Acute lateral meniscus tear of left knee 08/03/2016  . History of abnormal cervical Pap smear 09/21/2013  . Leukocytopenia, unspecified 03/02/2013  . S/P hip replacement 02/06/2013  . Hyperlipidemia 10/13/2012  . HTN (hypertension) 10/13/2012   Past Medical History:  Diagnosis Date  . Anemia    hx of  . Anxiety 20 years ago  . Depression 20 years ago  . GERD (gastroesophageal reflux disease)   . Heart murmur   . Hyperlipidemia   . Hypertension    history of htn, no meds now  . Ulcer    x2   Family History  Problem Relation Age of Onset  . Adopted: Yes   Past Surgical History:    Procedure Laterality Date  . ANTERIOR CRUCIATE LIGAMENT REPAIR Left 09/04/2016   Procedure: LEFT KNEE MEDIAL COLLATERAL LIGAMENT REPAIR, ANTERIOR CRUCIATE LIGAMENT RECONSTRUCTION, MENISCAL DEBRIDEMENT VERSUS REPAIR;  Surgeon: Cammy CopaScott Gregory Dean, MD;  Location: [facility];  Service: Orthopedics;  Laterality: Left;  . ESOPHAGOGASTRODUODENOSCOPY ENDOSCOPY     at least 4  . LAPAROSCOPIC APPENDECTOMY N/A 02/23/2015   Procedure: APPENDECTOMY LAPAROSCOPIC;  Surgeon: Gaynelle AduEric Wilson, MD;  Location: [facility];  Service: General;  Laterality: N/A;  . TOTAL HIP ARTHROPLASTY Left 02/06/2013   Procedure: LEFT TOTAL HIP ARTHROPLASTY ANTERIOR APPROACH;  Surgeon: Kathryne Hitchhristopher Y Blackman, MD;  Location: [facility];  Service: Orthopedics;  Laterality: Left;   Social History   Occupational History  . Not on file.   Social History Main Topics  . Smoking status: Former Smoker    Packs/day: 0.25    Years: 20.00    Types: Cigarettes    Quit date: 08/13/2013  . Smokeless tobacco: Never Used  . Alcohol use 0.0 oz/week     Comment: few glasses of wine a week  . Drug use: No  . Sexual activity: No

## 2017-02-18 NOTE — Progress Notes (Deleted)
Increased pain across lower back for several months. Right side is worse. Denies leg pain. Pain worse with increased walking. Can't walk for longer than 5 to 10 minutes with out pain and sitting is no longer helping like it used to.

## 2017-02-18 NOTE — Patient Instructions (Signed)

## 2017-02-19 LAB — CYTOLOGY - PAP
Diagnosis: UNDETERMINED — AB
HPV (WINDOPATH): DETECTED — AB

## 2017-02-19 NOTE — Procedures (Signed)
Lumbar Epidural Steroid Injection - Interlaminar Approach with Fluoroscopic Guidance  Patient: Lisa [name]      Date of Birth: 10/28/1970 MRN: [id number] PCP: Pearline Cablesopland, Jessica C, MD      Visit Date: 02/18/2017  Lisa [name] is a very pleasant 46 year old female followed by Dr. August Saucerean who requested potential interventional procedures for this patient's chronic worsening low back pain. Her brief history is that she is the daughter of a patient we do see through him his been having hip issues for many years. Her mother is not her birth mother. The patient has actually had an injection by me several years ago for left hip osteoarthritis even at her young age. She comes in today with a complaint of axial low back pain across the lumbar sacral junction about L4-L5. This is worse with standing and going from sit to stand but it's also worse with forward flexion. She has a lot of pain with forward flexion even more so than going sit to stand. Her worst pain though is standing. MRI of the lumbar spine has shown a right lateral disc herniation at L3 this was a few years ago. She also had facet arthropathy at L4-5 and L5-S1. She had disc herniation as well at L4-5 and L5-S1 which were more broad paracentral protrusions. I think her pain is multifactorial but we will complete a general interlaminar epidural injection and see how she does first with that. Depending on her relief we would either look at right-sided transforaminal injection at L3 if it declares itself to be more of a problem with that versus facet joint blocks. She has been working with physical therapy with Designer, multimediaAart Schulenklopper. She should continue with that after she sees how the injection does. She is actually going on vacation for a week or 2 and I think having a calm down during that time would be wise as well.   Universal Protocol:    Date/Time: 07/10/186:09 AM  Consent Given By: the patient  Position: PRONE  Additional Comments: Vital signs  were monitored before and after the procedure. Patient was prepped and draped in the usual sterile fashion. The correct patient, procedure, and site was verified.   Injection Procedure Details:  Procedure Site One Meds Administered:  Meds ordered this encounter  Medications  . lidocaine (PF) (XYLOCAINE) 1 % injection 2 mL  . methylPREDNISolone acetate (DEPO-MEDROL) injection 80 mg     Laterality: Right  Location/Site:  L4-L5  Needle size: 20 G  Needle type: Tuohy  Needle Placement: Paramedian epidural  Findings:  -Contrast Used: 1 mL iohexol 180 mg iodine/mL   -Comments: Excellent flow of contrast into the epidural space.  Procedure Details: Using a paramedian approach from the side mentioned above, the region overlying the inferior lamina was localized under fluoroscopic visualization and the soft tissues overlying this structure were infiltrated with 4 ml. of 1% Lidocaine without Epinephrine. The Tuohy needle was inserted into the epidural space using a paramedian approach.   The epidural space was localized using loss of resistance along with lateral and bi-planar fluoroscopic views.  After negative aspirate for air, blood, and CSF, a 2 ml. volume of Isovue-250 was injected into the epidural space and the flow of contrast was observed. Radiographs were obtained for documentation purposes.    The injectate was administered into the level noted above.   Additional Comments:  The patient tolerated the procedure well No complications occurred Dressing: Band-Aid    Post-procedure details: Patient was observed during the procedure.  Post-procedure instructions were reviewed.  Patient left the clinic in stable condition.

## 2017-02-20 ENCOUNTER — Encounter: Payer: Self-pay | Admitting: Family Medicine

## 2017-02-20 MED ORDER — FLUCONAZOLE 150 MG PO TABS: 150.0000 mg | ORAL_TABLET | Freq: Once | ORAL | 0 refills | Status: AC

## 2017-02-20 NOTE — Addendum Note (Signed)
Addended by: Abbe AmsterdamOPLAND, Pacey Willadsen C on: 02/20/2017 06:02 AM   Modules accepted: Orders

## 2017-03-04 DIAGNOSIS — M545 Low back pain: Secondary | ICD-10-CM | POA: Diagnosis not present

## 2017-03-10 ENCOUNTER — Encounter (INDEPENDENT_AMBULATORY_CARE_PROVIDER_SITE_OTHER): Payer: Self-pay | Admitting: Orthopedic Surgery

## 2017-03-11 ENCOUNTER — Telehealth (INDEPENDENT_AMBULATORY_CARE_PROVIDER_SITE_OTHER): Payer: Self-pay

## 2017-03-11 NOTE — Telephone Encounter (Signed)
Dr. August Saucerean,     If you could please pass this on the Dr. Alvester MorinNewton as well? It's been 3 weeks since the cortisone shot. I had it on 02/18/17. The majority of the pain lessened at times but never completely away. Today was the worst day that I have had since December 2016. I got up early this morning and went on errands. My back hurt a little after standing at home in front of the mirror but nothing bad. I went to 2 grocery stores and after standing in the first for about 15 min, I was starting to hurt bad. By the time I left the 2nd one, I could stand straight up but couldn't bend at the waist and try to pick something up any further than my hand would drop about 4 inches or less. It has been excruciating since. I took a Hydrocodone and a muscle relaxer and didn't help a bit. I need to know what we do from here. I can come in as soon as you can see me. Thank you.      Lisa [name]    Please review and advise. This was sent via mychart.

## 2017-03-11 NOTE — Telephone Encounter (Signed)
Needs surgical eval with yates or nitka here or cram jones out of here her choice pls clal thx

## 2017-03-11 NOTE — Telephone Encounter (Signed)
Notified patient via mychart which is how her original msg was sent.

## 2017-03-11 NOTE — Telephone Encounter (Signed)
Left message #1

## 2017-03-11 NOTE — Telephone Encounter (Signed)
Right L3 versus L4-5 facets

## 2017-03-11 NOTE — Telephone Encounter (Signed)
Pt is requesting another injection. Had Rt L4-5 IL on 02/18/17. Said it did ease pain some but she has been in sever pain the last 2 days and at the last appt you mentioned a different injection that you could try. Please advise.

## 2017-03-12 NOTE — Telephone Encounter (Signed)
Pt scheduled for 03/18/17 @ 2:45 w/driver

## 2017-03-18 ENCOUNTER — Ambulatory Visit (INDEPENDENT_AMBULATORY_CARE_PROVIDER_SITE_OTHER): Payer: BLUE CROSS/BLUE SHIELD | Admitting: Physical Medicine and Rehabilitation

## 2017-03-18 ENCOUNTER — Ambulatory Visit (INDEPENDENT_AMBULATORY_CARE_PROVIDER_SITE_OTHER): Payer: BLUE CROSS/BLUE SHIELD

## 2017-03-18 ENCOUNTER — Encounter (INDEPENDENT_AMBULATORY_CARE_PROVIDER_SITE_OTHER): Payer: Self-pay | Admitting: Physical Medicine and Rehabilitation

## 2017-03-18 VITALS — BP 142/98 | HR 97

## 2017-03-18 DIAGNOSIS — M5116 Intervertebral disc disorders with radiculopathy, lumbar region: Secondary | ICD-10-CM

## 2017-03-18 MED ORDER — BETAMETHASONE SOD PHOS & ACET 6 (3-3) MG/ML IJ SUSP
12.0000 mg | Freq: Once | INTRAMUSCULAR | Status: AC
  Administered 2017-03-18: 12 mg

## 2017-03-18 MED ORDER — LIDOCAINE HCL (PF) 1 % IJ SOLN
2.0000 mL | Freq: Once | INTRAMUSCULAR | Status: AC
  Administered 2017-03-18: 2 mL

## 2017-03-18 NOTE — Progress Notes (Unsigned)
Fluoro Time: 26 sec Mgy: 23.32

## 2017-03-18 NOTE — Progress Notes (Deleted)
States she had relief with last injection for a few days. Maybe 50 to 60% relief. Severe pain last Sunday and lasted for 3 days. Had to take off work for those 3 days. Pain medicine did not help. Some better the end of last week and this weekend. Tried working today and was having  Intense pressure when sitting so she had to leave work. Denies any pain down leg.

## 2017-03-18 NOTE — Patient Instructions (Signed)

## 2017-03-19 ENCOUNTER — Ambulatory Visit (INDEPENDENT_AMBULATORY_CARE_PROVIDER_SITE_OTHER): Payer: BLUE CROSS/BLUE SHIELD | Admitting: Orthopaedic Surgery

## 2017-03-19 ENCOUNTER — Encounter (INDEPENDENT_AMBULATORY_CARE_PROVIDER_SITE_OTHER): Payer: Self-pay | Admitting: Orthopaedic Surgery

## 2017-03-19 VITALS — BP 145/100 | Ht 67.0 in | Wt 173.0 lb

## 2017-03-19 DIAGNOSIS — M545 Low back pain: Secondary | ICD-10-CM

## 2017-03-19 DIAGNOSIS — G8929 Other chronic pain: Secondary | ICD-10-CM | POA: Diagnosis not present

## 2017-03-19 MED ORDER — HYDROCODONE-ACETAMINOPHEN 5-325 MG PO TABS: ORAL_TABLET | ORAL | 0 refills | Status: DC

## 2017-03-19 NOTE — Progress Notes (Signed)
Office Visit Note   Patient: Lisa [name]           Date of Birth: [birth year]           MRN: [id number] Visit Date: 03/19/2017              Requested by: Pearline Cables, MD [address] [place], [place] [address] PCP: Pearline Cables, MD   Assessment & Plan: Visit Diagnoses:  1. Chronic right-sided low back pain, with sciatica presence unspecified     Plan: Patient just had the right foraminal L3-4 epidural yesterday. We'll check her back in a few weeks if she continues to have problems we'll consider repeat MRI scan for comparison to her 2016 scan. PET scan results were reviewed she does have some facet arthropathy as well as the suggestion of far lateral disc on the right at L3-4 which at that time was a little bit more fluid-filled then most extruded fragments. Prescription for Norco 20 tablets prescribed 1 by mouth twice a day when necessary we discussed avoidance of the narcotic medicine she cannot take anti-inflammatories due to past stomach ulcer bleed. She can use Tylenol mineral ice, aspircream  etc.  Follow-Up Instructions: Return in about 2 weeks (around 04/02/2017).   Orders:  No orders of the defined types were placed in this encounter.  No orders of the defined types were placed in this encounter.     Procedures: No procedures performed   Clinical Data: No additional findings.   Subjective: Chief Complaint  Patient presents with  . Lower Back - Follow-up    ESi x 2 with FN    HPI 46 year old female with the greater than year history of back pain on the right side only no left-sided symptoms. She's had previous MRI 2016 showed far lateral disc on the right at L3-4. She's had a facet injection and yesterday had right L3-4 foraminal injection by Dr. Alvester Morin. 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 Pain with turning twisting. She can ambulate for about 10 minutes and she  begins to have some back pain on the right side. She gets relief with sitting. No associated bowel bladder symptoms she has had past history of bleeding ulcer. She denies chills or fever. Since yesterday's injection she's gotten about 80% pain relief.  Review of Systems ulcer hypertension hyperlipidemia previous anterior cruciate ligament reconstruction on the left. Total hip arthroplasty on the left for years ago Dr. Magnus Ivan. Left knee anterior cruciate ligament reconstruction. Prediabetes, low back pain otherwise 14 point review of systems is negative.   Objective: Vital Signs: BP (!) 145/100   Ht 5\' 7"  (1.702 m)   Wt 173 lb (78.5 kg)   BMI 27.10 kg/m   Physical Exam  Constitutional: She is oriented to person, place, and time. She appears well-developed.  HENT:  Head: Normocephalic.  Right Ear: External ear normal.  Left Ear: External ear normal.  Eyes: Pupils are equal, round, and reactive to light.  Neck: No tracheal deviation present. No thyromegaly present.  Cardiovascular: Normal rate.   Pulmonary/Chest: Effort normal.  Abdominal: Soft.  Musculoskeletal:  Patient name [name] gets from sitting standing position on off exam table easily she can heel and toe walk. Quad strength is good. Well-healed scars left anterior cruciate ligament left total hip arthroplasty. Tenderness L2-L5 on the right side paralumbar. Negative sciatic notch tenderness negative straight leg raising  90. Minimal discomfort right hip with 30 internal rotation. No pain with left hip internal/external rotation. Knees reach full extension. Negative anterior drawer left knee post anterior cruciate ligament reconstruction. Distal pulses are intact knee and ankle jerk 1+ and symmetrical. Anterior tib EHL is strong. No hip adductor weakness. Symmetrical quad strength. No hip flexion weakness.  Neurological: She is alert and oriented to person, place, and time.  Skin: Skin is warm and dry.  Psychiatric: She has a normal  mood and affect. Her behavior is normal.    Ortho Exam  Specialty Comments:  No specialty comments available.  Imaging: Xr C-arm No Report  Result Date: 03/18/2017 Please see Notes or Procedures tab for imaging impression.    PMFS History: Patient Active Problem List   Diagnosis Date Noted  . Pre-diabetes 02/14/2017  . Left anterior cruciate ligament tear 08/03/2016  . Acute medial meniscus tear of left knee 08/03/2016  . Acute lateral meniscus tear of left knee 08/03/2016  . History of abnormal cervical Pap smear 09/21/2013  . Leukocytopenia, unspecified 03/02/2013  . S/P hip replacement 02/06/2013  . Hyperlipidemia 10/13/2012  . HTN (hypertension) 10/13/2012   Past Medical History:  Diagnosis Date  . Anemia    hx of  . Anxiety 20 years ago  . Depression 20 years ago  . GERD (gastroesophageal reflux disease)   . Heart murmur   . Hyperlipidemia   . Hypertension    history of htn, no meds now  . Ulcer    x2    Family History  Problem Relation Age of Onset  . Adopted: Yes    Past Surgical History:  Procedure Laterality Date  . ANTERIOR CRUCIATE LIGAMENT REPAIR Left 09/04/2016   Procedure: LEFT KNEE MEDIAL COLLATERAL LIGAMENT REPAIR, ANTERIOR CRUCIATE LIGAMENT RECONSTRUCTION, MENISCAL DEBRIDEMENT VERSUS REPAIR;  Surgeon: Cammy CopaScott Gregory Dean, MD;  Location: [facility];  Service: Orthopedics;  Laterality: Left;  . ESOPHAGOGASTRODUODENOSCOPY ENDOSCOPY     at least 4  . LAPAROSCOPIC APPENDECTOMY N/A 02/23/2015   Procedure: APPENDECTOMY LAPAROSCOPIC;  Surgeon: Gaynelle AduEric Wilson, MD;  Location: [facility];  Service: General;  Laterality: N/A;  . TOTAL HIP ARTHROPLASTY Left 02/06/2013   Procedure: LEFT TOTAL HIP ARTHROPLASTY ANTERIOR APPROACH;  Surgeon: Kathryne Hitchhristopher Y Blackman, MD;  Location: [facility];  Service: Orthopedics;  Laterality: Left;   Social History   Occupational History  . Not on file.   Social History Main Topics  . Smoking status: Former Smoker    Packs/day: 0.25     Years: 20.00    Types: Cigarettes    Quit date: 08/13/2013  . Smokeless tobacco: Never Used  . Alcohol use 0.0 oz/week     Comment: few glasses of wine a week  . Drug use: No  . Sexual activity: No

## 2017-03-19 NOTE — Procedures (Signed)
Lisa [name] is a 46 year old female followed by Dr. August Saucerean. She had an MRI from a couple of years ago showing right lateral disc herniation at L3-4 as well as some facet arthropathy at L4-5 Doreene AdasHurley for her age. We completed recent intralaminar epidural injection at L4-5 with 50-60% relief for a few days. Then she had severe pain that seemed to worsen for 3 days and she actually had to take off work. She reports no pain medicines help at this point. If she was some better at the end of the week and this weekend. She's tried working the day but is having increased pressure when sitting so she had leave work once again. She has no leg pain. Her pain is worse with flexion. I still think this is more of a disc related pain. Going to complete a right L3 transforaminal injection. She does have an appointment with Dr. Ophelia CharterYates tomorrow for evaluation. They may wish to repeat her MRI. She is undergoing physical therapy [facility] Designer, multimediaAart [facility]  Lumbosacral Transforaminal Epidural Steroid Injection - Sub-Pedicular Approach with Fluoroscopic Guidance  Patient: Lisa [name]      Date of Birth: 09/20/1970 MRN: [id number] PCP: Pearline Cablesopland, Jessica C, MD      Visit Date: 03/18/2017   Universal Protocol:    Date/Time: 03/18/2017  Consent Given By: the patient  Position: PRONE  Additional Comments: Vital signs were monitored before and after the procedure. Patient was prepped and draped in the usual sterile fashion. The correct patient, procedure, and site was verified.   Injection Procedure Details:  Procedure Site One Meds Administered:  Meds ordered this encounter  Medications  . lidocaine (PF) (XYLOCAINE) 1 % injection 2 mL  . betamethasone acetate-betamethasone sodium phosphate (CELESTONE) injection 12 mg    Laterality: Right  Location/Site:  L3-L4  Needle size: 22 G  Needle type: Spinal  Needle Placement: Transforaminal  Findings:  -Contrast Used: 1 mL iohexol 180  mg iodine/mL   -Comments: Excellent flow of contrast along the nerve and into the epidural space.  Procedure Details: After squaring off the end-plates to get a true AP view, the C-arm was positioned so that an oblique view of the foramen as noted above was visualized. The target area is just inferior to the "nose of the scotty dog" or sub pedicular. The soft tissues overlying this structure were infiltrated with 2-3 ml. of 1% Lidocaine without Epinephrine.  The spinal needle was inserted toward the target using a "trajectory" view along the fluoroscope beam.  Under AP and lateral visualization, the needle was advanced so it did not puncture dura and was located close the 6 O'Clock position of the pedical in AP tracterory. Biplanar projections were used to confirm position. Aspiration was confirmed to be negative for CSF and/or blood. A 1-2 ml. volume of Isovue-250 was injected and flow of contrast was noted at each level. Radiographs were obtained for documentation purposes.   After attaining the desired flow of contrast documented above, a 0.5 to 1.0 ml test dose of 0.25% Marcaine was injected into each respective transforaminal space.  The patient was observed for 90 seconds post injection.  After no sensory deficits were reported, and normal lower extremity motor function was noted,   the above injectate was administered so that equal amounts of the injectate were placed at each foramen (level) into the transforaminal epidural space.   Additional Comments:  The patient tolerated the procedure well Dressing: Band-Aid    Post-procedure details: Patient was  observed during the procedure. Post-procedure instructions were reviewed.  Patient left the clinic in stable condition.

## 2017-03-20 ENCOUNTER — Ambulatory Visit (INDEPENDENT_AMBULATORY_CARE_PROVIDER_SITE_OTHER): Payer: BLUE CROSS/BLUE SHIELD | Admitting: Obstetrics & Gynecology

## 2017-03-20 VITALS — BP 128/82 | HR 99 | Wt 174.0 lb

## 2017-03-20 DIAGNOSIS — Z3202 Encounter for pregnancy test, result negative: Secondary | ICD-10-CM | POA: Diagnosis not present

## 2017-03-20 DIAGNOSIS — R87811 Vaginal high risk human papillomavirus (HPV) DNA test positive: Secondary | ICD-10-CM | POA: Diagnosis not present

## 2017-03-20 DIAGNOSIS — Z01812 Encounter for preprocedural laboratory examination: Secondary | ICD-10-CM

## 2017-03-20 DIAGNOSIS — R87620 Atypical squamous cells of undetermined significance on cytologic smear of vagina (ASC-US): Secondary | ICD-10-CM

## 2017-03-20 LAB — POCT URINE PREGNANCY: Preg Test, Ur: NEGATIVE

## 2017-03-21 ENCOUNTER — Other Ambulatory Visit (HOSPITAL_COMMUNITY)
Admission: RE | Admit: 2017-03-21 | Discharge: 2017-03-21 | Disposition: A | Discharge status: Home / Self Care | Payer: BLUE CROSS/BLUE SHIELD | Source: Ambulatory Visit | Attending: Obstetrics & Gynecology | Admitting: Obstetrics & Gynecology

## 2017-03-21 ENCOUNTER — Encounter: Payer: Self-pay | Admitting: Obstetrics & Gynecology

## 2017-03-21 DIAGNOSIS — R87620 Atypical squamous cells of undetermined significance on cytologic smear of vagina (ASC-US): Secondary | ICD-10-CM | POA: Insufficient documentation

## 2017-03-21 DIAGNOSIS — A630 Anogenital (venereal) warts: Secondary | ICD-10-CM | POA: Diagnosis not present

## 2017-03-21 DIAGNOSIS — N72 Inflammatory disease of cervix uteri: Secondary | ICD-10-CM | POA: Diagnosis not present

## 2017-03-21 DIAGNOSIS — R87811 Vaginal high risk human papillomavirus (HPV) DNA test positive: Secondary | ICD-10-CM | POA: Diagnosis not present

## 2017-03-22 NOTE — Progress Notes (Signed)
Pt was referred for management and eval of an abnormal PAP. After a full discussion of what PAP smears screen for and how dysplasia works including its relationship to HPV, pt requested further evaluation with a colposcopy.  Patient given informed consent, signed copy in the chart, time out was performed.  Placed in lithotomy position. Cervix viewed with speculum and colposcope after application of acetic acid.  02/14/2017 Diagnosis Endometrium, curettage - BENIGN SECRETORY-TYPE ENDOMETRIUM. - BENIGN ENDOCERVICAL-TYPE MUCOSA. - THERE IS NO EVIDENCE OF HYPERPLASIA OR MALIGNANCY.  Colposcopy adequate?  yes Acetowhite lesions? yes Punctation? no Mosaicism?  no Abnormal vasculature?  no Biopsies? Yes @ 12 o'clock ECC? yes   Patient was given post procedure instructions.  She wants her results called to her.  Her follow up will be determined by the results of her biopsies which I expect to be low grade.   Doren Kaspar L. Harraway-Smith, M.D., Evern CoreFACOG

## 2017-03-26 ENCOUNTER — Other Ambulatory Visit (INDEPENDENT_AMBULATORY_CARE_PROVIDER_SITE_OTHER): Payer: Self-pay

## 2017-03-26 ENCOUNTER — Encounter (INDEPENDENT_AMBULATORY_CARE_PROVIDER_SITE_OTHER): Payer: Self-pay | Admitting: Orthopedic Surgery

## 2017-03-26 DIAGNOSIS — G8929 Other chronic pain: Secondary | ICD-10-CM

## 2017-03-26 DIAGNOSIS — Z7689 Persons encountering health services in other specified circumstances: Secondary | ICD-10-CM

## 2017-03-26 DIAGNOSIS — M5442 Lumbago with sciatica, left side: Principal | ICD-10-CM

## 2017-04-02 ENCOUNTER — Encounter (INDEPENDENT_AMBULATORY_CARE_PROVIDER_SITE_OTHER): Payer: Self-pay | Admitting: Orthopaedic Surgery

## 2017-04-02 ENCOUNTER — Ambulatory Visit (INDEPENDENT_AMBULATORY_CARE_PROVIDER_SITE_OTHER): Payer: BLUE CROSS/BLUE SHIELD | Admitting: Orthopaedic Surgery

## 2017-04-02 VITALS — BP 136/92 | HR 104 | Ht 67.0 in | Wt 173.0 lb

## 2017-04-02 DIAGNOSIS — M5126 Other intervertebral disc displacement, lumbar region: Secondary | ICD-10-CM | POA: Diagnosis not present

## 2017-04-02 NOTE — Progress Notes (Signed)
Office Visit Note   Patient: Lisa [name]           Date of Birth: [birth year]           MRN: [id number] Visit Date: 04/02/2017              Requested by: Pearline Cables, MD [address] [place], [place] [address] PCP: Pearline Cables, MD   Assessment & Plan: Visit Diagnoses:  1. Protrusion of lumbar intervertebral disc     Plan: Her pain is progress is interfered with her work she's had pain medication, muscle relaxants epidural steroid injections 2. We'll proceed with a new MRI scan for comparison to the scan in 2016 which showed prominent far lateral L3-4 disc extrusion on the right office follow-up after scan  Follow-Up Instructions: No Follow-up on file.   Orders:  No orders of the defined types were placed in this encounter.  No orders of the defined types were placed in this encounter.     Procedures: No procedures performed   Clinical Data: No additional findings.   Subjective: Chief Complaint  Patient presents with  . Lower Back - Pain    HPI 46 year old female returns to the ongoing problems with back pain. She states she's lost about 6 days work in the last month due to her back pain. She had some weight gain with epidural and states epidural may be gave her 40% to 50% relief. She still has problems when she bends forward feels like her back locks up. She is used hydrocodone and also Robaxin with some relief. Patient states she's concerned about the amount of work that she is messed and about the performing a doing a good job when she works.  Review of Systems patient's had previous anterior cruciate ligament reconstruction with partial medial and lateral meniscectomies by Dr. August Saucer in December 2017. also left total hip arthroplasty by Dr. Magnus Ivan.   Objective: Vital Signs: BP (!) 136/92   Pulse (!) 104   Ht 5\' 7"  (1.702 m)   Wt 173 lb (78.5 kg)   BMI 27.10 kg/m   Physical Exam  Constitutional: She is oriented to person, place,  and time. She appears well-developed.  HENT:  Head: Normocephalic.  Right Ear: External ear normal.  Left Ear: External ear normal.  Eyes: Pupils are equal, round, and reactive to light.  Neck: No tracheal deviation present. No thyromegaly present.  Cardiovascular: Normal rate.   Pulmonary/Chest: Effort normal.  Abdominal: Soft.  Musculoskeletal:  Patient has some sciatic notch tenderness mild right and left. No trochanteric bursal tenderness negative straight leg raising 90. Some pain with reverse straight leg raising on the right. Quads are strong and intact knee and ankle jerk. Gastrocsoleus is functioning as his anterior tib.  Neurological: She is alert and oriented to person, place, and time.  Skin: Skin is warm and dry.  Psychiatric: She has a normal mood and affect. Her behavior is normal.    Ortho Exam  Specialty Comments:  No specialty comments available.  Imaging: No results found.   PMFS History: Patient Active Problem List   Diagnosis Date Noted  . Pre-diabetes 02/14/2017  . Left anterior cruciate ligament tear 08/03/2016  . Acute medial meniscus tear of left knee 08/03/2016  . Acute lateral meniscus tear of left knee 08/03/2016  . History of abnormal cervical Pap smear 09/21/2013  . Leukocytopenia, unspecified 03/02/2013  . S/P hip replacement 02/06/2013  . Hyperlipidemia 10/13/2012  .  HTN (hypertension) 10/13/2012   Past Medical History:  Diagnosis Date  . Anemia    hx of  . Anxiety 20 years ago  . Depression 20 years ago  . GERD (gastroesophageal reflux disease)   . Heart murmur   . Hyperlipidemia   . Hypertension    history of htn, no meds now  . Ulcer    x2  . Vaginal Pap smear, abnormal     Family History  Problem Relation Age of Onset  . Adopted: Yes    Past Surgical History:  Procedure Laterality Date  . ANTERIOR CRUCIATE LIGAMENT REPAIR Left 09/04/2016   Procedure: LEFT KNEE MEDIAL COLLATERAL LIGAMENT REPAIR, ANTERIOR CRUCIATE LIGAMENT  RECONSTRUCTION, MENISCAL DEBRIDEMENT VERSUS REPAIR;  Surgeon: Cammy Copa, MD;  Location: [facility];  Service: Orthopedics;  Laterality: Left;  . ESOPHAGOGASTRODUODENOSCOPY ENDOSCOPY     at least 4  . LAPAROSCOPIC APPENDECTOMY N/A 02/23/2015   Procedure: APPENDECTOMY LAPAROSCOPIC;  Surgeon: Gaynelle Adu, MD;  Location: [facility];  Service: General;  Laterality: N/A;  . TOTAL HIP ARTHROPLASTY Left 02/06/2013   Procedure: LEFT TOTAL HIP ARTHROPLASTY ANTERIOR APPROACH;  Surgeon: Kathryne Hitch, MD;  Location: [facility];  Service: Orthopedics;  Laterality: Left;   Social History   Occupational History  . Not on file.   Social History Main Topics  . Smoking status: Former Smoker    Packs/day: 0.25    Years: 20.00    Types: Cigarettes    Quit date: 08/13/2013  . Smokeless tobacco: Never Used  . Alcohol use 0.0 oz/week     Comment: few glasses of wine a week  . Drug use: No  . Sexual activity: No

## 2017-04-02 NOTE — Telephone Encounter (Signed)
Patient requested referral to either Dr Wynetta Emery or Dr Yetta Barre. Referral was entered into patients chart.

## 2017-04-02 NOTE — Addendum Note (Signed)
Addended by: Rogers Seeds on: 04/02/2017 02:02 PM   Modules accepted: Orders

## 2017-04-03 ENCOUNTER — Ambulatory Visit
Admission: RE | Admit: 2017-04-03 | Discharge: 2017-04-03 | Disposition: A | Discharge status: Home / Self Care | Payer: BLUE CROSS/BLUE SHIELD | Source: Ambulatory Visit | Attending: Orthopaedic Surgery | Admitting: Orthopaedic Surgery

## 2017-04-03 DIAGNOSIS — M5126 Other intervertebral disc displacement, lumbar region: Secondary | ICD-10-CM | POA: Diagnosis not present

## 2017-04-05 ENCOUNTER — Other Ambulatory Visit (INDEPENDENT_AMBULATORY_CARE_PROVIDER_SITE_OTHER): Payer: Self-pay | Admitting: Orthopedic Surgery

## 2017-04-05 ENCOUNTER — Other Ambulatory Visit (INDEPENDENT_AMBULATORY_CARE_PROVIDER_SITE_OTHER): Payer: Self-pay

## 2017-04-05 ENCOUNTER — Telehealth: Payer: Self-pay

## 2017-04-05 MED ORDER — METHOCARBAMOL 500 MG PO TABS: 500.0000 mg | ORAL_TABLET | Freq: Three times a day (TID) | ORAL | 0 refills | Status: DC | PRN

## 2017-04-05 NOTE — Telephone Encounter (Signed)
Left message for patient to return call to office.  Ly Bacchi RNBSN 

## 2017-04-05 NOTE — Telephone Encounter (Signed)
-----   Message from Willodean Rosenthal, MD sent at 04/03/2017  2:43 PM EDT ----- Please call pt. Her bx were WNL. She needs a repeat PAP in 1 year.  Thx ch-S

## 2017-04-05 NOTE — Telephone Encounter (Signed)
y

## 2017-04-08 DIAGNOSIS — M5126 Other intervertebral disc displacement, lumbar region: Secondary | ICD-10-CM | POA: Diagnosis not present

## 2017-04-08 DIAGNOSIS — Z6826 Body mass index (BMI) 26.0-26.9, adult: Secondary | ICD-10-CM | POA: Diagnosis not present

## 2017-04-08 DIAGNOSIS — R030 Elevated blood-pressure reading, without diagnosis of hypertension: Secondary | ICD-10-CM | POA: Diagnosis not present

## 2017-04-12 ENCOUNTER — Telehealth (INDEPENDENT_AMBULATORY_CARE_PROVIDER_SITE_OTHER): Payer: Self-pay | Admitting: Physical Medicine and Rehabilitation

## 2017-04-12 NOTE — Telephone Encounter (Signed)
Patient aware we have not received referral yet. She will call and check on this.

## 2017-04-12 NOTE — Telephone Encounter (Signed)
See if we can confirm with Wynetta Emeryram on site of injection

## 2017-04-19 ENCOUNTER — Encounter (INDEPENDENT_AMBULATORY_CARE_PROVIDER_SITE_OTHER): Payer: Self-pay | Admitting: Orthopedic Surgery

## 2017-04-22 ENCOUNTER — Other Ambulatory Visit (INDEPENDENT_AMBULATORY_CARE_PROVIDER_SITE_OTHER): Payer: Self-pay

## 2017-04-22 ENCOUNTER — Encounter (INDEPENDENT_AMBULATORY_CARE_PROVIDER_SITE_OTHER): Payer: Self-pay | Admitting: Orthopedic Surgery

## 2017-04-22 MED ORDER — HYDROCODONE-ACETAMINOPHEN 5-325 MG PO TABS: ORAL_TABLET | ORAL | 0 refills | Status: DC

## 2017-04-22 NOTE — Telephone Encounter (Signed)
Referral received and patient scheduled

## 2017-04-22 NOTE — Telephone Encounter (Signed)
Patient requesting refill on norco. Please advise.

## 2017-04-23 ENCOUNTER — Ambulatory Visit (INDEPENDENT_AMBULATORY_CARE_PROVIDER_SITE_OTHER): Payer: BLUE CROSS/BLUE SHIELD | Admitting: Orthopaedic Surgery

## 2017-04-23 DIAGNOSIS — H5213 Myopia, bilateral: Secondary | ICD-10-CM | POA: Diagnosis not present

## 2017-04-23 DIAGNOSIS — H40013 Open angle with borderline findings, low risk, bilateral: Secondary | ICD-10-CM | POA: Diagnosis not present

## 2017-04-30 ENCOUNTER — Ambulatory Visit (INDEPENDENT_AMBULATORY_CARE_PROVIDER_SITE_OTHER): Payer: Self-pay

## 2017-04-30 ENCOUNTER — Encounter (INDEPENDENT_AMBULATORY_CARE_PROVIDER_SITE_OTHER): Payer: Self-pay | Admitting: Physical Medicine and Rehabilitation

## 2017-04-30 ENCOUNTER — Ambulatory Visit (INDEPENDENT_AMBULATORY_CARE_PROVIDER_SITE_OTHER): Payer: BLUE CROSS/BLUE SHIELD | Admitting: Physical Medicine and Rehabilitation

## 2017-04-30 VITALS — BP 120/85 | HR 82

## 2017-04-30 DIAGNOSIS — M545 Low back pain, unspecified: Secondary | ICD-10-CM

## 2017-04-30 DIAGNOSIS — G8929 Other chronic pain: Secondary | ICD-10-CM | POA: Diagnosis not present

## 2017-04-30 DIAGNOSIS — M47816 Spondylosis without myelopathy or radiculopathy, lumbar region: Secondary | ICD-10-CM

## 2017-04-30 MED ORDER — METHYLPREDNISOLONE ACETATE 80 MG/ML IJ SUSP
80.0000 mg | Freq: Once | INTRAMUSCULAR | Status: AC
  Administered 2017-04-30: 80 mg

## 2017-04-30 MED ORDER — LIDOCAINE HCL (PF) 1 % IJ SOLN
2.0000 mL | Freq: Once | INTRAMUSCULAR | Status: AC
  Administered 2017-04-30: 2 mL

## 2017-04-30 NOTE — Patient Instructions (Signed)

## 2017-04-30 NOTE — Progress Notes (Deleted)
50-60% relief with last injection for 2 weeks and then pain right back. Pain across back. Right side is worse. Worse with standing any length of time. Denies leg pain.

## 2017-05-02 ENCOUNTER — Encounter: Payer: Self-pay | Admitting: Family Medicine

## 2017-05-02 DIAGNOSIS — R197 Diarrhea, unspecified: Secondary | ICD-10-CM

## 2017-05-02 MED ORDER — PANTOPRAZOLE SODIUM 40 MG PO TBEC: 40.0000 mg | DELAYED_RELEASE_TABLET | Freq: Every day | ORAL | 3 refills | Status: DC

## 2017-05-02 MED ORDER — DIPHENOXYLATE-ATROPINE 2.5-0.025 MG PO TABS: 1.0000 | ORAL_TABLET | Freq: Four times a day (QID) | ORAL | 1 refills | Status: DC | PRN

## 2017-05-03 NOTE — Progress Notes (Signed)
Lisa [name] - 46 y.o. female MRN [id number]  Date of birth: Dec 24, 1970  Office Visit Note: Visit Date: 04/30/2017 PCP: Pearline Cables, MD Referred by: Pearline Cables, MD  Subjective: Chief Complaint  Patient presents with  . Lower Back - Pain   HPI: Lisa [name] is a 47 year old female that I seen on a few occasions in the past. She has had prior total hip arthroplasty and mainly followed by Dr. August Saucer in our office for orthopedic care. She has had a repeat lumbar spine MRI since I've last seen her in this is reviewed below. We completed an epidural injection a few months ago without really much relief of her symptoms. She is follow-up with both Dr. Ophelia Charter and Dr. Wynetta Emery. Dr. Wynetta Emery stenosis a request for bilateral facet joint blocks at L4-5 and L5-S1 diagnostically and hopefully therapeutically.    ROS Otherwise per HPI.  Assessment & Plan: Visit Diagnoses:  1. Spondylosis without myelopathy or radiculopathy, lumbar region   2. Chronic bilateral low back pain without sciatica     Plan: Findings:  L4-5 and L5-S1 diagnostic and therapeutic intra-articular facet joint blocks performed today. The patient will follow up with Dr. Wynetta Emery.    Meds & Orders:  Meds ordered this encounter  Medications  . lidocaine (PF) (XYLOCAINE) 1 % injection 2 mL  . methylPREDNISolone acetate (DEPO-MEDROL) injection 80 mg    Orders Placed This Encounter  Procedures  . Facet Injection  . XR C-ARM NO REPORT    Follow-up: Return if symptoms worsen or fail to improve, for Dr. Wynetta Emery.   Procedures: No procedures performed  Lumbar Facet Joint Intra-Articular Injection(s) with Fluoroscopic Guidance  Patient: Lisa [name]      Date of Birth: September 18, 1970 MRN: [id number] PCP: Pearline Cables, MD      Visit Date: 04/30/2017   Universal Protocol:    Date/Time: 04/30/2017  Consent Given By: the patient  Position: PRONE   Additional Comments: Vital signs were monitored before and after the  procedure. Patient was prepped and draped in the usual sterile fashion. The correct patient, procedure, and site was verified.   Injection Procedure Details:  Procedure Site One Meds Administered:  Meds ordered this encounter  Medications  . lidocaine (PF) (XYLOCAINE) 1 % injection 2 mL  . methylPREDNISolone acetate (DEPO-MEDROL) injection 80 mg     Laterality: Bilateral  Location/Site:  L4-L5 L5-S1  Needle size: 22 guage  Needle type: Spinal  Needle Placement: Articular  Findings:  -Contrast Used: 1 mL iohexol 180 mg iodine/mL   -Comments: Excellent flow of contrast producing a partial arthrogram.  Procedure Details: The fluoroscope beam is vertically oriented in AP, and the inferior recess is visualized beneath the lower pole of the inferior apophyseal process, which represents the target point for needle insertion. When direct visualization is difficult the target point is located at the medial projection of the vertebral pedicle. The region overlying each aforementioned target is locally anesthetized with a 1 to 2 ml. volume of 1% Lidocaine without Epinephrine.   The spinal needle was inserted into each of the above mentioned facet joints using biplanar fluoroscopic guidance. A 0.25 to 0.5 ml. volume of Isovue-250 was injected and a partial facet joint arthrogram was obtained. A single spot film was obtained of the resulting arthrogram.    One to 1.25 ml of the steroid/anesthetic solution was then injected into each of the facet joints noted above.   Additional Comments:  The patient tolerated the procedure well  Dressing: Band-Aid    Post-procedure details: Patient was observed during the procedure. Post-procedure instructions were reviewed.  Patient left the clinic in stable condition.     Clinical History: MRI LUMBAR SPINE WITHOUT CONTRAST  TECHNIQUE: Multiplanar, multisequence MR imaging of the lumbar spine was performed. No intravenous contrast was  administered.  COMPARISON:  Lumbar spine MRI 07/22/2015  FINDINGS: Segmentation:  Standard.  Alignment:  Physiologic.  Vertebrae:  No fracture, evidence of discitis, or bone lesion.  Conus medullaris: Extends to the L1 level and appears normal.  Paraspinal and other soft tissues: Negative.  Disc levels:  T12-L1: Normal disc space. No spinal canal stenosis. No neural foraminal stenosis. Normal facets.  L1-L2: Normal disc space. No spinal canal stenosis. No neural foraminal stenosis. Normal facets.  L2-L3: Small left foraminal protrusion No spinal canal stenosis. No neural foraminal stenosis. Normal facets.  L3-L4: Right foraminal disc protrusion with annular fissure. No spinal canal stenosis. No neural foraminal stenosis. Normal facets.  L4-L5: Small central disc protrusion, unchanged. Mild left lateral recess narrowing. No spinal canal stenosis. No neural foraminal stenosis. Normal facets.  L5-S1: Medium-sized left subarticular protrusion, unchanged. Mild narrowing of the left lateral recess. No spinal canal stenosis. No neural foraminal stenosis. Normal facets.  Visualized sacrum: Normal.  IMPRESSION: 1. Unchanged left subarticular disc protrusion at L5-S1 narrowing the left lateral recess. Correlate for left S1 radiculopathy. 2. Small central disc protrusion at L4-L5, unchanged, narrowing the left lateral recess. Correlate for left L5 radiculopathy. 3. Right foraminal disc protrusion at L3-L4 appears less prominent. No associated neural impingement.   Electronically Signed   By: Deatra Robinson M.D.   On: 04/04/2017 04:26  She reports that she quit smoking about 3 years ago. Her smoking use included Cigarettes. She has a 5.00 pack-year smoking history. She has never used smokeless tobacco.   Recent Labs  01/23/17 0950  HGBA1C 6.1    Objective:  VS:  HT:    WT:   BMI:     BP:120/85  HR:82bpm  TEMP: ( )  RESP:99 % Physical Exam    Musculoskeletal:  Patient has pain with extension rotation of the lumbar spine. No pain over the greater trochanters and good distal strength.    Ortho Exam Imaging: No results found.  Past Medical/Family/Surgical/Social History: Medications & Allergies reviewed per EMR Patient Active Problem List   Diagnosis Date Noted  . Pre-diabetes 02/14/2017  . Left anterior cruciate ligament tear 08/03/2016  . Acute medial meniscus tear of left knee 08/03/2016  . Acute lateral meniscus tear of left knee 08/03/2016  . History of abnormal cervical Pap smear 09/21/2013  . Leukocytopenia, unspecified 03/02/2013  . S/P hip replacement 02/06/2013  . Hyperlipidemia 10/13/2012  . HTN (hypertension) 10/13/2012   Past Medical History:  Diagnosis Date  . Anemia    hx of  . Anxiety 20 years ago  . Depression 20 years ago  . GERD (gastroesophageal reflux disease)   . Heart murmur   . Hyperlipidemia   . Hypertension    history of htn, no meds now  . Ulcer    x2  . Vaginal Pap smear, abnormal    Family History  Problem Relation Age of Onset  . Adopted: Yes   Past Surgical History:  Procedure Laterality Date  . ANTERIOR CRUCIATE LIGAMENT REPAIR Left 09/04/2016   Procedure: LEFT KNEE MEDIAL COLLATERAL LIGAMENT REPAIR, ANTERIOR CRUCIATE LIGAMENT RECONSTRUCTION, MENISCAL DEBRIDEMENT VERSUS REPAIR;  Surgeon: Cammy Copa, MD;  Location: [facility];  Service: Orthopedics;  Laterality: Left;  . ESOPHAGOGASTRODUODENOSCOPY ENDOSCOPY     at least 4  . LAPAROSCOPIC APPENDECTOMY N/A 02/23/2015   Procedure: APPENDECTOMY LAPAROSCOPIC;  Surgeon: Gaynelle Adu, MD;  Location: [facility];  Service: General;  Laterality: N/A;  . TOTAL HIP ARTHROPLASTY Left 02/06/2013   Procedure: LEFT TOTAL HIP ARTHROPLASTY ANTERIOR APPROACH;  Surgeon: Kathryne Hitch, MD;  Location: [facility];  Service: Orthopedics;  Laterality: Left;   Social History   Occupational History  . Not on file.   Social History Main Topics  .  Smoking status: Former Smoker    Packs/day: 0.25    Years: 20.00    Types: Cigarettes    Quit date: 08/13/2013  . Smokeless tobacco: Never Used  . Alcohol use 0.0 oz/week     Comment: few glasses of wine a week  . Drug use: No  . Sexual activity: No

## 2017-05-03 NOTE — Procedures (Signed)
Lumbar Facet Joint Intra-Articular Injection(s) with Fluoroscopic Guidance  Patient: Lisa [name]      Date of Birth: 1970/10/24 MRN: [id number] PCP: Pearline Cables, MD      Visit Date: 04/30/2017   Universal Protocol:    Date/Time: 04/30/2017  Consent Given By: the patient  Position: PRONE   Additional Comments: Vital signs were monitored before and after the procedure. Patient was prepped and draped in the usual sterile fashion. The correct patient, procedure, and site was verified.   Injection Procedure Details:  Procedure Site One Meds Administered:  Meds ordered this encounter  Medications  . lidocaine (PF) (XYLOCAINE) 1 % injection 2 mL  . methylPREDNISolone acetate (DEPO-MEDROL) injection 80 mg     Laterality: Bilateral  Location/Site:  L4-L5 L5-S1  Needle size: 22 guage  Needle type: Spinal  Needle Placement: Articular  Findings:  -Contrast Used: 1 mL iohexol 180 mg iodine/mL   -Comments: Excellent flow of contrast producing a partial arthrogram.  Procedure Details: The fluoroscope beam is vertically oriented in AP, and the inferior recess is visualized beneath the lower pole of the inferior apophyseal process, which represents the target point for needle insertion. When direct visualization is difficult the target point is located at the medial projection of the vertebral pedicle. The region overlying each aforementioned target is locally anesthetized with a 1 to 2 ml. volume of 1% Lidocaine without Epinephrine.   The spinal needle was inserted into each of the above mentioned facet joints using biplanar fluoroscopic guidance. A 0.25 to 0.5 ml. volume of Isovue-250 was injected and a partial facet joint arthrogram was obtained. A single spot film was obtained of the resulting arthrogram.    One to 1.25 ml of the steroid/anesthetic solution was then injected into each of the facet joints noted above.   Additional Comments:  The patient tolerated the  procedure well Dressing: Band-Aid    Post-procedure details: Patient was observed during the procedure. Post-procedure instructions were reviewed.  Patient left the clinic in stable condition.

## 2017-05-08 DIAGNOSIS — M545 Low back pain: Secondary | ICD-10-CM | POA: Diagnosis not present

## 2017-05-14 DIAGNOSIS — Z23 Encounter for immunization: Secondary | ICD-10-CM | POA: Diagnosis not present

## 2017-05-15 DIAGNOSIS — M545 Low back pain: Secondary | ICD-10-CM | POA: Diagnosis not present

## 2017-05-17 ENCOUNTER — Ambulatory Visit (INDEPENDENT_AMBULATORY_CARE_PROVIDER_SITE_OTHER): Payer: BLUE CROSS/BLUE SHIELD | Admitting: Physician Assistant

## 2017-05-17 VITALS — BP 120/88 | Temp 98.6°F

## 2017-05-17 DIAGNOSIS — Z30013 Encounter for initial prescription of injectable contraceptive: Secondary | ICD-10-CM

## 2017-05-17 LAB — POCT URINE PREGNANCY: Preg Test, Ur: NEGATIVE

## 2017-05-17 MED ORDER — MEDROXYPROGESTERONE ACETATE 150 MG/ML IM SUSP
150.0000 mg | Freq: Once | INTRAMUSCULAR | Status: AC
  Administered 2017-05-17: 150 mg via INTRAMUSCULAR

## 2017-05-17 NOTE — Progress Notes (Signed)
I did not see this pt. She is here for depo-provera injection only.

## 2017-05-21 DIAGNOSIS — M5440 Lumbago with sciatica, unspecified side: Secondary | ICD-10-CM | POA: Diagnosis not present

## 2017-05-22 DIAGNOSIS — M545 Low back pain: Secondary | ICD-10-CM | POA: Diagnosis not present

## 2017-05-22 DIAGNOSIS — M25562 Pain in left knee: Secondary | ICD-10-CM | POA: Diagnosis not present

## 2017-05-22 DIAGNOSIS — M47896 Other spondylosis, lumbar region: Secondary | ICD-10-CM | POA: Diagnosis not present

## 2017-05-27 ENCOUNTER — Encounter: Payer: Self-pay | Admitting: Physician Assistant

## 2017-05-27 ENCOUNTER — Ambulatory Visit (INDEPENDENT_AMBULATORY_CARE_PROVIDER_SITE_OTHER): Payer: BLUE CROSS/BLUE SHIELD

## 2017-05-27 ENCOUNTER — Ambulatory Visit (INDEPENDENT_AMBULATORY_CARE_PROVIDER_SITE_OTHER): Payer: BLUE CROSS/BLUE SHIELD | Admitting: Physician Assistant

## 2017-05-27 VITALS — BP 126/82 | HR 100 | Temp 98.0°F | Resp 16 | Ht 67.0 in | Wt 170.0 lb

## 2017-05-27 DIAGNOSIS — M533 Sacrococcygeal disorders, not elsewhere classified: Secondary | ICD-10-CM

## 2017-05-27 DIAGNOSIS — S34139A Unspecified injury to sacral spinal cord, initial encounter: Secondary | ICD-10-CM | POA: Diagnosis not present

## 2017-05-27 DIAGNOSIS — S300XXA Contusion of lower back and pelvis, initial encounter: Secondary | ICD-10-CM

## 2017-05-27 MED ORDER — HYDROCODONE-ACETAMINOPHEN 5-325 MG PO TABS: 1.0000 | ORAL_TABLET | Freq: Four times a day (QID) | ORAL | 0 refills | Status: AC | PRN

## 2017-05-27 NOTE — Progress Notes (Signed)
PRIMARY CARE AT [facility] [address] [place] [address] [phone]  Date:  05/27/2017   Name:  Lisa [name]   DOB:  1971/05/18   MRN:  [id number]  PCP:  Pearline Cables, MD    History of Present Illness:  Lisa [name] is a 46 y.o. female patient who presents to PCP with  Chief Complaint  Patient presents with  . Fall    x 2 days  . Back Pain     Fell 2 days ago, in the bathroom and fell.  She felt fine, but woke up with terrible back pain.  Aggravated with sitting up straight.   Low back and tailbone pain that is more localized.  No numbness opr tingling.   She has never injured that area of her back before.   She has done tylenol for pain, which has not helped much.   Patient Active Problem List   Diagnosis Date Noted  . Pre-diabetes 02/14/2017  . Left anterior cruciate ligament tear 08/03/2016  . Acute medial meniscus tear of left knee 08/03/2016  . Acute lateral meniscus tear of left knee 08/03/2016  . History of abnormal cervical Pap smear 09/21/2013  . Leukocytopenia, unspecified 03/02/2013  . S/P hip replacement 02/06/2013  . Hyperlipidemia 10/13/2012  . HTN (hypertension) 10/13/2012    Past Medical History:  Diagnosis Date  . Anemia    hx of  . Anxiety 20 years ago  . Depression 20 years ago  . GERD (gastroesophageal reflux disease)   . Heart murmur   . Hyperlipidemia   . Hypertension    history of htn, no meds now  . Ulcer    x2  . Vaginal Pap smear, abnormal     Past Surgical History:  Procedure Laterality Date  . ANTERIOR CRUCIATE LIGAMENT REPAIR Left 09/04/2016   Procedure: LEFT KNEE MEDIAL COLLATERAL LIGAMENT REPAIR, ANTERIOR CRUCIATE LIGAMENT RECONSTRUCTION, MENISCAL DEBRIDEMENT VERSUS REPAIR;  Surgeon: Cammy Copa, MD;  Location: [facility] OR;  Service: Orthopedics;  Laterality: Left;  . ESOPHAGOGASTRODUODENOSCOPY ENDOSCOPY     at least 4  . LAPAROSCOPIC APPENDECTOMY N/A 02/23/2015   Procedure: APPENDECTOMY LAPAROSCOPIC;  Surgeon: Gaynelle Adu, MD;  Location: [facility];  Service: General;  Laterality: N/A;  . TOTAL HIP ARTHROPLASTY Left 02/06/2013   Procedure: LEFT TOTAL HIP ARTHROPLASTY ANTERIOR APPROACH;  Surgeon: Kathryne Hitch, MD;  Location: [facility];  Service: Orthopedics;  Laterality: Left;    Social History  Substance Use Topics  . Smoking status: Former Smoker    Packs/day: 0.25    Years: 20.00    Types: Cigarettes    Quit date: 08/13/2013  . Smokeless tobacco: Never Used  . Alcohol use 0.0 oz/week     Comment: few glasses of wine a week    Family History  Problem Relation Age of Onset  . Adopted: Yes    Allergies  Allergen Reactions  . Nsaids Other (See Comments)    ulcers    Medication list has been reviewed and updated.  Current Outpatient Prescriptions on File Prior to Visit  Medication Sig Dispense Refill  . diphenoxylate-atropine (LOMOTIL) 2.5-0.025 MG tablet Take 1 tablet by mouth 4 (four) times daily as needed for diarrhea or loose stools. 180 tablet 1  . HYDROcodone-acetaminophen (NORCO/VICODIN) 5-325 MG tablet 1 po BID prn pain 20 tablet 0  . MAGNESIUM PO Take 1 tablet by mouth daily.    . methocarbamol (ROBAXIN) 500 MG tablet Take 1 tablet (500 mg total) by mouth  every 8 (eight) hours as needed for muscle spasms. 30 tablet 0  . ondansetron (ZOFRAN) 4 MG tablet Take 1 tablet (4 mg total) by mouth every 8 (eight) hours as needed for nausea or vomiting. 30 tablet 0  . pantoprazole (PROTONIX) 40 MG tablet Take 1 tablet (40 mg total) by mouth daily. 90 tablet 3  . POTASSIUM PO Take 1 tablet by mouth daily.    . simvastatin (ZOCOR) 20 MG tablet Take 1 tablet (20 mg total) by mouth every evening. 90 tablet 3  . SUMAtriptan (IMITREX) 50 MG tablet Take 1 tablet (50 mg total) by mouth every 2 (two) hours as needed for migraine. Max 100 mg in 24 hours 10 tablet 3  . traZODone (DESYREL) 50 MG tablet Take 0.5-1 tablets (25-50 mg total) by mouth at bedtime as needed for sleep. May take up to 2 tablets  if needed 180 tablet 2  . medroxyPROGESTERone (DEPO-PROVERA) 150 MG/ML injection Inject 1 mL (150 mg total) into the muscle once. 1 mL 0   No current facility-administered medications on file prior to visit.     ROS ROS otherwise unremarkable unless listed above.  Physical Examination: BP 126/82   Pulse 100   Temp 98 F (36.7 C) (Oral)   Resp 16   Ht  (1.702 m)   Wt 170 lb (77.1 kg)   SpO2 98%   BMI 26.63 kg/m  Ideal Body Weight: Weight in (lb) to have BMI = 25: 159.3  Physical Exam  Constitutional: She is oriented to person, place, and time. She appears well-developed and well-nourished. No distress.  HENT:  Head: Normocephalic and atraumatic.  Right Ear: External ear normal.  Left Ear: External ear normal.  Eyes: Pupils are equal, round, and reactive to light. Conjunctivae and EOM are normal.  Cardiovascular: Normal rate.   Pulmonary/Chest: Effort normal. No respiratory distress.  Musculoskeletal:  Tenderness at the sacrum and coccyx without erythema.  Some rom difficulty secondary to pan with right leg extension.  No crepitus.   Neurological: She is alert and oriented to person, place, and time.  Skin: She is not diaphoretic.  Psychiatric: She has a normal mood and affect. Her behavior is normal.    Dg Sacrum/coccyx  Result Date: 05/27/2017 CLINICAL DATA:  Fall, tailbone pain. EXAM: SACRUM AND COCCYX - 2+ VIEW COMPARISON:  CT 02/23/2015 FINDINGS: Prior left hip replacement. SI joints are symmetric and unremarkable. No visible sacral or coccygeal fracture. IMPRESSION: No acute bony abnormality. Electronically Signed   By: Charlett Nose M.D.   On: 05/27/2017 11:04   Assessment and Plan: Lisa [name] is a 46 y.o. female who is here today for cc of advised icing three times per day Reviewed the substance report.  Given 5 days.  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  She will return at that time.  Advised obtainment of circular cushioning  (donut) Sacral pain - Plan: DG Sacrum/Coccyx, HYDROcodone-acetaminophen (NORCO) 5-325 MG tablet  Coccyx contusion, initial encounter  Trena Platt, PA-C [facility] 10/16/20189:02 AM

## 2017-05-27 NOTE — Patient Instructions (Addendum)
Please ice the back three times per day for 15 minutes. Please pick up a donut from a pharmacy or  [facility] [address], [place], [place] [address] [phone]  Tailbone Injury The tailbone is the small bone at the lower end of the backbone (spine). You may have stretched tissues, bruises, or a broken bone (fracture). These injuries can be painful. Most tailbone injuries get better on their own in 4-6 weeks. Follow these instructions at home:  Take medicines only as told by your doctor.  If told, apply ice to the injured area. ? Put ice in a plastic bag. ? Place a towel between your skin and the bag. ? Leave the ice on for 20 minutes, 2-3 times per day. Do this for the first 1-2 days.  Sit on a large, rubber or inflated ring or cushion to lessen pain. Lean forward when you sit to help lessen pain.  Avoid sitting in one place for a long time.  Increase your activity as the pain allows.  Do exercises as told by your doctor or physical therapist.  If it is painful to poop, take medicine to help you poop (stool softeners) as told by your doctor.  Eat foods that have plenty of fiber.  Keep all follow-up visits as told by your doctor. This is important. Contact a doctor if:  Your pain gets worse.  Pooping causes you pain.  You cannot poop (constipation).  You are leaking pee (urinary incontinence).  You have a fever. This information is not intended to replace advice given to you by your health care provider. Make sure you discuss any questions you have with your health care provider. Document Released: 09/01/2010 Document Revised: 03/29/2016 Document Reviewed: 07/26/2014 Elsevier Interactive Patient Education  2018 [employer].     IF you received an x-ray today, you will receive an invoice from [facility]. Please contact [facility] at [phone] with questions or concerns regarding your invoice.   IF you received labwork  today, you will receive an invoice from [facility]. Please contact [facility] at [phone] with questions or concerns regarding your invoice.   Our billing staff will not be able to assist you with questions regarding bills from these companies.  You will be contacted with the lab results as soon as they are available. The fastest way to get your results is to activate your My Chart account. Instructions are located on the last page of this paperwork. If you have not heard from [place] regarding the results in 2 weeks, please contact this office.

## 2017-08-03 ENCOUNTER — Ambulatory Visit (INDEPENDENT_AMBULATORY_CARE_PROVIDER_SITE_OTHER): Payer: BLUE CROSS/BLUE SHIELD | Admitting: Physician Assistant

## 2017-08-03 DIAGNOSIS — Z3042 Encounter for surveillance of injectable contraceptive: Secondary | ICD-10-CM | POA: Diagnosis not present

## 2017-08-03 MED ORDER — MEDROXYPROGESTERONE ACETATE 150 MG/ML IM SUSP
150.0000 mg | Freq: Once | INTRAMUSCULAR | Status: AC
  Administered 2017-08-03: 150 mg via INTRAMUSCULAR

## 2017-08-08 NOTE — Progress Notes (Signed)
Depo provera injection only.

## 2017-09-05 DIAGNOSIS — F4310 Post-traumatic stress disorder, unspecified: Secondary | ICD-10-CM | POA: Diagnosis not present

## 2017-09-17 DIAGNOSIS — F4310 Post-traumatic stress disorder, unspecified: Secondary | ICD-10-CM | POA: Diagnosis not present

## 2017-09-30 ENCOUNTER — Encounter (INDEPENDENT_AMBULATORY_CARE_PROVIDER_SITE_OTHER): Payer: Self-pay | Admitting: Orthopedic Surgery

## 2017-09-30 ENCOUNTER — Other Ambulatory Visit (INDEPENDENT_AMBULATORY_CARE_PROVIDER_SITE_OTHER): Payer: Self-pay

## 2017-09-30 MED ORDER — HYDROCODONE-ACETAMINOPHEN 5-325 MG PO TABS: ORAL_TABLET | ORAL | 0 refills | Status: DC

## 2017-09-30 NOTE — Telephone Encounter (Signed)
Please see mychart message patient sent and advise. Thanks.  Dr. August Saucerean,     This morning, out of the blue, my lower back was hurting again. It hasn't bothered me in months. At the present time, I am unable to bend over or stand back up without excruciating pain. Would it be possible to refill the hydrocodone? I currently have 1 pill left. Also, if it continues, to set up a cortisone shot with Dr. Alvester MorinNewton?    Thank you,    Durenda HurtKim Meenan

## 2017-10-09 DIAGNOSIS — F4321 Adjustment disorder with depressed mood: Secondary | ICD-10-CM | POA: Diagnosis not present

## 2017-10-24 DIAGNOSIS — F4310 Post-traumatic stress disorder, unspecified: Secondary | ICD-10-CM | POA: Diagnosis not present

## 2017-10-26 ENCOUNTER — Ambulatory Visit (INDEPENDENT_AMBULATORY_CARE_PROVIDER_SITE_OTHER): Payer: BLUE CROSS/BLUE SHIELD | Admitting: *Deleted

## 2017-10-26 VITALS — BP 106/82 | Temp 98.6°F

## 2017-10-26 DIAGNOSIS — Z789 Other specified health status: Secondary | ICD-10-CM

## 2017-10-26 DIAGNOSIS — Z309 Encounter for contraceptive management, unspecified: Secondary | ICD-10-CM | POA: Diagnosis not present

## 2017-10-26 MED ORDER — MEDROXYPROGESTERONE ACETATE 150 MG/ML IM SUSP
150.0000 mg | Freq: Once | INTRAMUSCULAR | Status: AC
  Administered 2017-10-26: 150 mg via INTRAMUSCULAR

## 2017-10-26 NOTE — Patient Instructions (Signed)
Depo-Provera injection only

## 2017-11-20 ENCOUNTER — Encounter: Payer: Self-pay | Admitting: Physician Assistant

## 2017-11-25 ENCOUNTER — Encounter: Payer: Self-pay | Admitting: Family Medicine

## 2017-11-25 DIAGNOSIS — R197 Diarrhea, unspecified: Secondary | ICD-10-CM

## 2017-11-26 MED ORDER — DIPHENOXYLATE-ATROPINE 2.5-0.025 MG PO TABS: 1.0000 | ORAL_TABLET | Freq: Four times a day (QID) | ORAL | 1 refills | Status: DC | PRN

## 2017-11-26 NOTE — Progress Notes (Addendum)
Healthcare at [facility] [address] [place], [place] [address] [phone]  Date:  11/27/2017   Name:  Lisa [name]   DOB:  [birth year]   MRN:  [id number]  PCP:  Pearline Cables, MD    Chief Complaint: Diarrhea (5 times in past 2 weeks, woken up with diarrhea, no otc meds)   History of Present Illness:  Lisa [name] is a 47 y.o. very pleasant female patient who presents with the following:  [name] had sent me an email about diarrhea and I asked her to come in  I don't know if I need to come in to see you or not. In the last 2, I have woken up at night from going to the bathroom, #2, in my bed. Like diarrhea, to where I have to change underwear, the sheets etc. I've taken the Diphenoxylate right before bed but last night it happened again. Do you have any suggestions or should I make an appointment to come see you?   She notes that about 2.5 weeks ago she started having fecal incontinence during the night. This has occurred 5x. She will be asleep, will wake up and find that she has passed stool in the bed.  The stool will be liquid/ very loose.  However it is definitely stool and not urine.   No accidents during the day She does take trazodone for sleep but this is not new  She has a GI appt on 4/29 She has lomotil to use prn.  She takes these just as needed, but has started using before bed. She is not sure if this helps as she had an incident even after taking her lomotil.   No change in her diet No recent travel No exposure to GI illness   She has IBS and lactose intolerance, but has never had this problem in the past   No recent abx No exposure to C diff known  She othrewise feels well No vomiting No fever  She is not fasting- however would like to go ahead and get labs while she is here today   Patient Active Problem List   Diagnosis Date Noted  . Pre-diabetes 02/14/2017  . Left anterior cruciate ligament tear  08/03/2016  . Acute medial meniscus tear of left knee 08/03/2016  . Acute lateral meniscus tear of left knee 08/03/2016  . History of abnormal cervical Pap smear 09/21/2013  . Leukocytopenia, unspecified 03/02/2013  . S/P hip replacement 02/06/2013  . Hyperlipidemia 10/13/2012  . HTN (hypertension) 10/13/2012    Past Medical History:  Diagnosis Date  . Anemia    hx of  . Anxiety 20 years ago  . Depression 20 years ago  . GERD (gastroesophageal reflux disease)   . Heart murmur   . Hyperlipidemia   . Hypertension    history of htn, no meds now  . Ulcer    x2  . Vaginal Pap smear, abnormal     Past Surgical History:  Procedure Laterality Date  . ANTERIOR CRUCIATE LIGAMENT REPAIR Left 09/04/2016   Procedure: LEFT KNEE MEDIAL COLLATERAL LIGAMENT REPAIR, ANTERIOR CRUCIATE LIGAMENT RECONSTRUCTION, MENISCAL DEBRIDEMENT VERSUS REPAIR;  Surgeon: Cammy Copa, MD;  Location: [facility];  Service: Orthopedics;  Laterality: Left;  . ESOPHAGOGASTRODUODENOSCOPY ENDOSCOPY     at least 4  . LAPAROSCOPIC APPENDECTOMY N/A 02/23/2015   Procedure: APPENDECTOMY LAPAROSCOPIC;  Surgeon: Gaynelle Adu, MD;  Location: [facility];  Service: General;  Laterality: N/A;  . TOTAL HIP ARTHROPLASTY Left 02/06/2013   Procedure: LEFT TOTAL HIP ARTHROPLASTY ANTERIOR APPROACH;  Surgeon: Kathryne Hitchhristopher Y Blackman, MD;  Location: [facility];  Service: Orthopedics;  Laterality: Left;    Social History   Tobacco Use  . Smoking status: Former Smoker    Packs/day: 0.25    Years: 20.00    Pack years: 5.00    Types: Cigarettes    Last attempt to quit: 08/13/2013    Years since quitting: 4.2  . Smokeless tobacco: Never Used  Substance Use Topics  . Alcohol use: Yes    Alcohol/week: 0.0 oz    Comment: few glasses of wine a week  . Drug use: No    Family History  Adopted: Yes    Allergies  Allergen Reactions  . Nsaids Other (See Comments)    ulcers    Medication list has been reviewed and updated.  Current  Outpatient Medications on File Prior to Visit  Medication Sig Dispense Refill  . diphenoxylate-atropine (LOMOTIL) 2.5-0.025 MG tablet Take 1 tablet by mouth 4 (four) times daily as needed for diarrhea or loose stools. 180 tablet 1  . HYDROcodone-acetaminophen (NORCO/VICODIN) 5-325 MG tablet 1 po BID prn pain 20 tablet 0  . MAGNESIUM PO Take 1 tablet by mouth daily.    . ondansetron (ZOFRAN) 4 MG tablet Take 1 tablet (4 mg total) by mouth every 8 (eight) hours as needed for nausea or vomiting. 30 tablet 0  . pantoprazole (PROTONIX) 40 MG tablet Take 1 tablet (40 mg total) by mouth daily. 90 tablet 3  . POTASSIUM PO Take 1 tablet by mouth daily.    . simvastatin (ZOCOR) 20 MG tablet Take 1 tablet (20 mg total) by mouth every evening. 90 tablet 3  . SUMAtriptan (IMITREX) 50 MG tablet Take 1 tablet (50 mg total) by mouth every 2 (two) hours as needed for migraine. Max 100 mg in 24 hours 10 tablet 3  . traZODone (DESYREL) 50 MG tablet Take 0.5-1 tablets (25-50 mg total) by mouth at bedtime as needed for sleep. May take up to 2 tablets if needed 180 tablet 2  . medroxyPROGESTERone (DEPO-PROVERA) 150 MG/ML injection Inject 1 mL (150 mg total) into the muscle once. 1 mL 0   No current facility-administered medications on file prior to visit.     Review of Systems:  As per HPI- otherwise negative. No blood in her stools   Physical Examination: Vitals:   11/27/17 1503  BP: 124/72  Pulse: 95  Resp: 16  SpO2: 97%   Vitals:   11/27/17 1503  Weight: 177 lb (80.3 kg)  Height: 5\' 7"  (1.702 m)   Body mass index is 27.72 kg/m. Ideal Body Weight: Weight in (lb) to have BMI = 25: 159.3  GEN: WDWN, NAD, Non-toxic, A & O x 3, overweight, looks well  HEENT: Atraumatic, Normocephalic. Neck supple. No masses, No LAD.  Marland Kitchen.et Ears and Nose: No external deformity. CV: RRR, No M/G/R. No JVD. No thrill. No extra heart sounds. PULM: CTA B, no wheezes, crackles, rhonchi. No retractions. No resp. distress.  No accessory muscle use. ABD: S, NT, ND, +BS. No rebound. No HSM.  Benign belly Normal rectal tone EXTR: No c/c/e NEURO Normal gait.  PSYCH: Normally interactive. Conversant. Not depressed or anxious appearing.  Calm demeanor.    Assessment and Plan: Full incontinence of feces  Diarrhea, unspecified type - Plan: CBC, Comprehensive metabolic panel, Stool culture, C. difficile GDH and Toxin A/B, CANCELED: Clostridium  difficile EIA  Pre-diabetes - Plan: Hemoglobin A1c  Other hyperlipidemia - Plan: Lipid panel  Here today with episodes of fecal incontinence during the night for the last 2-3 weeks She has a GI appt pending in about 10 days Will start eval with labs as above She is to contact me if getting worse or if any other concerns in the meantime    Signed Abbe Amsterdam, MD  Received her labs so far, message to pt 4/18  Your cholesterol looks quite good Blood counts are normal Metabolic profile is normal Your A1c is still in the pre-diabetes range, ok.  I'll be in touch with your other labs asap!  Results for orders placed or performed in visit on 11/27/17  CBC  Result Value Ref Range   WBC 4.6 4.0 - 10.5 K/uL   RBC 3.80 (L) 3.87 - 5.11 Mil/uL   Platelets 225.0 150.0 - 400.0 K/uL   Hemoglobin 12.0 12.0 - 15.0 g/dL   HCT 23.5 57.3 - 22.0 %   MCV 96.5 78.0 - 100.0 fl   MCHC 32.7 30.0 - 36.0 g/dL   RDW 25.4 27.0 - 62.3 %  Comprehensive metabolic panel  Result Value Ref Range   Sodium 139 135 - 145 mEq/L   Potassium 4.3 3.5 - 5.1 mEq/L   Chloride 103 96 - 112 mEq/L   CO2 26 19 - 32 mEq/L   Glucose, Bld 107 (H) 70 - 99 mg/dL   BUN 6 6 - 23 mg/dL   Creatinine, Ser 7.62 0.40 - 1.20 mg/dL   Total Bilirubin 0.4 0.2 - 1.2 mg/dL   Alkaline Phosphatase 47 39 - 117 U/L   AST 35 0 - 37 U/L   ALT 29 0 - 35 U/L   Total Protein 6.8 6.0 - 8.3 g/dL   Albumin 4.2 3.5 - 5.2 g/dL   Calcium 9.0 8.4 - 83.1 mg/dL   GFR 51.76 >16.07 mL/min  Lipid panel  Result Value Ref Range    Cholesterol 176 0 - 200 mg/dL   Triglycerides 371.0 (H) 0.0 - 149.0 mg/dL   HDL 62.69 >48.54 mg/dL   Total CHOL/HDL Ratio 3   Hemoglobin A1c  Result Value Ref Range   Hgb A1c MFr Bld 6.3 4.6 - 6.5 %  LDL cholesterol, direct  Result Value Ref Range   Direct LDL 69.0 mg/dL

## 2017-11-27 ENCOUNTER — Encounter: Payer: Self-pay | Admitting: Family Medicine

## 2017-11-27 ENCOUNTER — Ambulatory Visit: Payer: BLUE CROSS/BLUE SHIELD | Admitting: Family Medicine

## 2017-11-27 VITALS — BP 124/72 | HR 95 | Resp 16 | Ht 67.0 in | Wt 177.0 lb

## 2017-11-27 DIAGNOSIS — R197 Diarrhea, unspecified: Secondary | ICD-10-CM | POA: Diagnosis not present

## 2017-11-27 DIAGNOSIS — R159 Full incontinence of feces: Secondary | ICD-10-CM

## 2017-11-27 DIAGNOSIS — R7303 Prediabetes: Secondary | ICD-10-CM | POA: Diagnosis not present

## 2017-11-27 DIAGNOSIS — E7849 Other hyperlipidemia: Secondary | ICD-10-CM

## 2017-11-27 NOTE — Patient Instructions (Addendum)
Good to see you today- but I am sorry that you are having this problem.  I will get some blood tests for you, and also will have you collect some stool at home for testing  Please let me know if your symptoms are changing or getting worse!   We will plan to have you see Dr. Bosie ClosSchooler as scheduled

## 2017-11-28 ENCOUNTER — Encounter: Payer: Self-pay | Admitting: Family Medicine

## 2017-11-28 LAB — CBC
HEMATOCRIT: 36.7 % (ref 36.0–46.0)
Hemoglobin: 12 g/dL (ref 12.0–15.0)
MCHC: 32.7 g/dL (ref 30.0–36.0)
MCV: 96.5 fl (ref 78.0–100.0)
Platelets: 225 10*3/uL (ref 150.0–400.0)
RBC: 3.8 Mil/uL — AB (ref 3.87–5.11)
RDW: 14.3 % (ref 11.5–15.5)
WBC: 4.6 10*3/uL (ref 4.0–10.5)

## 2017-11-28 LAB — COMPREHENSIVE METABOLIC PANEL
ALBUMIN: 4.2 g/dL (ref 3.5–5.2)
ALK PHOS: 47 U/L (ref 39–117)
ALT: 29 U/L (ref 0–35)
AST: 35 U/L (ref 0–37)
BUN: 6 mg/dL (ref 6–23)
CHLORIDE: 103 meq/L (ref 96–112)
CO2: 26 mEq/L (ref 19–32)
CREATININE: 0.79 mg/dL (ref 0.40–1.20)
Calcium: 9 mg/dL (ref 8.4–10.5)
GFR: 83.54 mL/min (ref >60.00)
GLUCOSE: 107 mg/dL — AB (ref 70–99)
POTASSIUM: 4.3 meq/L (ref 3.5–5.1)
SODIUM: 139 meq/L (ref 135–145)
TOTAL PROTEIN: 6.8 g/dL (ref 6.0–8.3)
Total Bilirubin: 0.4 mg/dL (ref 0.2–1.2)

## 2017-11-28 LAB — LDL CHOLESTEROL, DIRECT: LDL DIRECT: 69 mg/dL

## 2017-11-28 LAB — LIPID PANEL
CHOL/HDL RATIO: 3
CHOLESTEROL: 176 mg/dL (ref 0–200)
HDL: 68.8 mg/dL (ref >39.00)
Triglycerides: 458 mg/dL — ABNORMAL HIGH (ref 0.0–149.0)

## 2017-11-28 LAB — HEMOGLOBIN A1C: HEMOGLOBIN A1C: 6.3 % (ref 4.6–6.5)

## 2017-12-09 DIAGNOSIS — R197 Diarrhea, unspecified: Secondary | ICD-10-CM | POA: Diagnosis not present

## 2017-12-09 DIAGNOSIS — R159 Full incontinence of feces: Secondary | ICD-10-CM | POA: Diagnosis not present

## 2017-12-27 DIAGNOSIS — R197 Diarrhea, unspecified: Secondary | ICD-10-CM | POA: Diagnosis not present

## 2017-12-27 DIAGNOSIS — K640 First degree hemorrhoids: Secondary | ICD-10-CM | POA: Diagnosis not present

## 2017-12-27 DIAGNOSIS — K5730 Diverticulosis of large intestine without perforation or abscess without bleeding: Secondary | ICD-10-CM | POA: Diagnosis not present

## 2017-12-27 DIAGNOSIS — K621 Rectal polyp: Secondary | ICD-10-CM | POA: Diagnosis not present

## 2017-12-27 DIAGNOSIS — R159 Full incontinence of feces: Secondary | ICD-10-CM | POA: Diagnosis not present

## 2017-12-31 DIAGNOSIS — K621 Rectal polyp: Secondary | ICD-10-CM | POA: Diagnosis not present

## 2018-01-02 ENCOUNTER — Encounter: Payer: Self-pay | Admitting: Family Medicine

## 2018-01-15 ENCOUNTER — Encounter: Payer: Self-pay | Admitting: Family Medicine

## 2018-01-15 DIAGNOSIS — R159 Full incontinence of feces: Secondary | ICD-10-CM | POA: Insufficient documentation

## 2018-01-21 ENCOUNTER — Ambulatory Visit (INDEPENDENT_AMBULATORY_CARE_PROVIDER_SITE_OTHER): Payer: BLUE CROSS/BLUE SHIELD | Admitting: Emergency Medicine

## 2018-01-21 DIAGNOSIS — Z309 Encounter for contraceptive management, unspecified: Secondary | ICD-10-CM | POA: Diagnosis not present

## 2018-01-21 MED ORDER — MEDROXYPROGESTERONE ACETATE 150 MG/ML IM SUSP
150.0000 mg | Freq: Once | INTRAMUSCULAR | Status: AC
  Administered 2018-01-21: 150 mg via INTRAMUSCULAR

## 2018-01-21 NOTE — Progress Notes (Signed)
Patient here for Depo-Provera 150 mg injection only and within calendar schedule visit.

## 2018-01-24 IMAGING — MR MR LUMBAR SPINE W/O CM
4 of 5 series · 20 of 48 positions shown · non-contrast
Comparison: Lumbar spine MRI 07/22/2015

CLINICAL DATA: Chronic back pain.

EXAM:
MRI LUMBAR SPINE WITHOUT CONTRAST
TECHNIQUE: Multiplanar, multisequence MR imaging of the lumbar spine was
performed. No intravenous contrast was administered.

[Series 7: T1 · sagittal · 4.0mm · 0.73mm/px · 3 of 15 slices shown (1 of 2)]
[im 3/15]
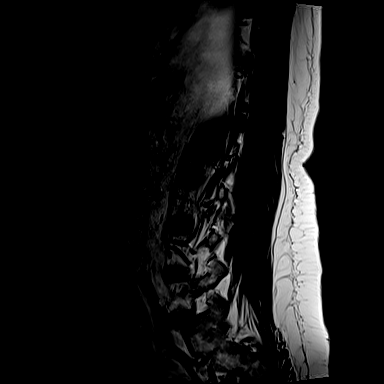
[im 9/15]
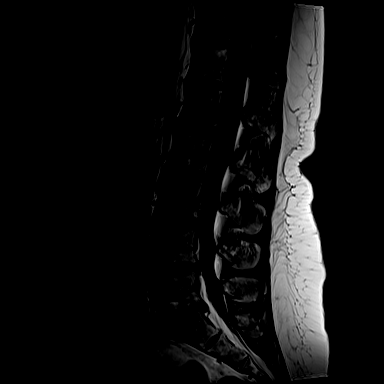
[im 15/15]
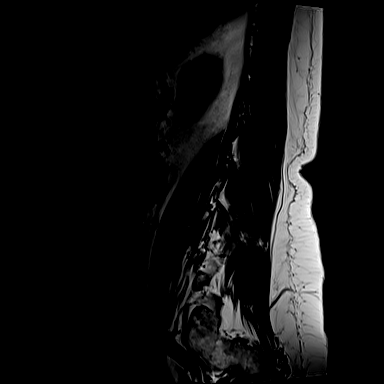

[Series 8: T2 · sagittal · 4.0mm · 0.73mm/px · 6 of 15 slices shown (1 of 2)]
[im 1/15]
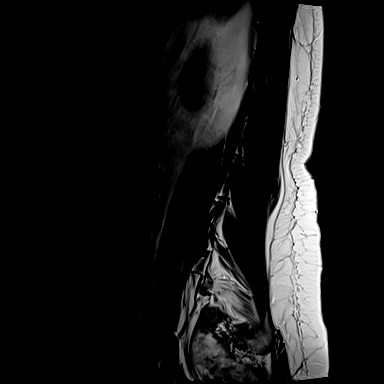
[im 3/15]
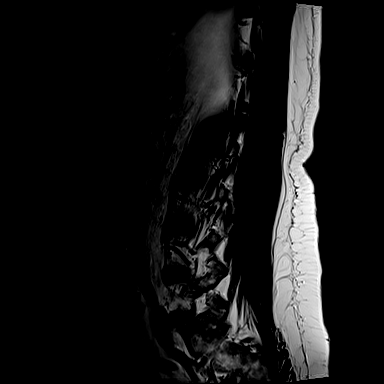
[im 6/15]
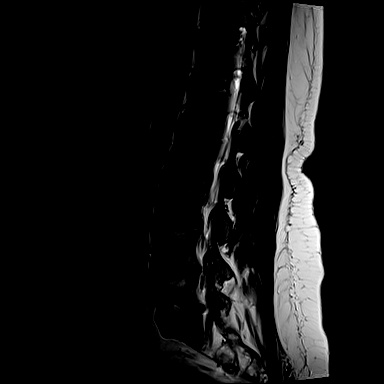
[im 9/15]
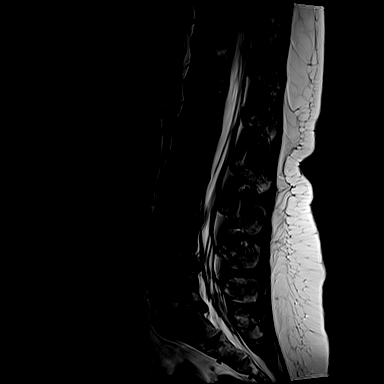
[im 12/15]
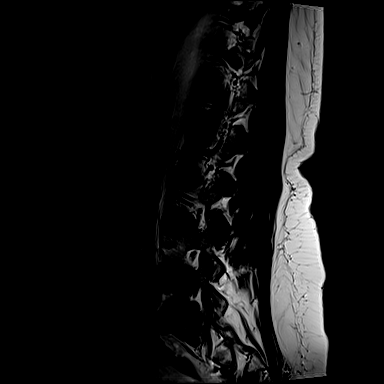
[im 15/15]
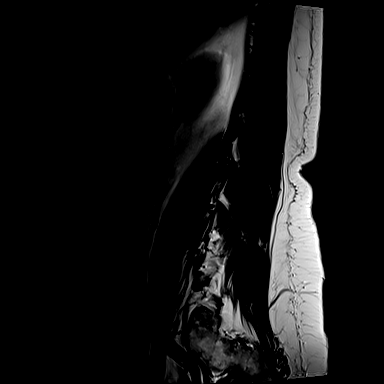

[Series 9: T1 · axial · 4.0mm · 0.56mm/px · z∈[-27,+129]mm · 3 of 39 slices shown (2 of 2)]
[im 6/39]
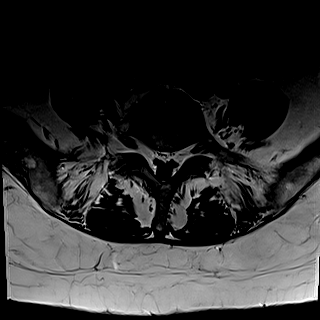
[im 20/39]
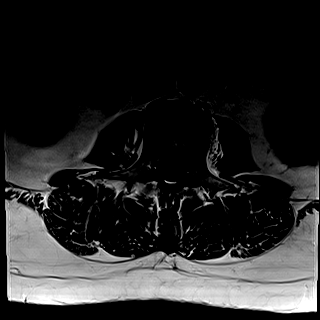
[im 33/39]
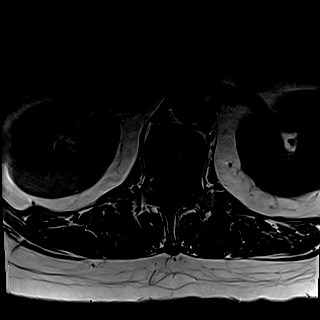

[Series 10: T2 · axial · 4.0mm · 0.28mm/px · z∈[-51,+129]mm · 8 of 39 slices shown (2 of 2)]
[im 1/39]
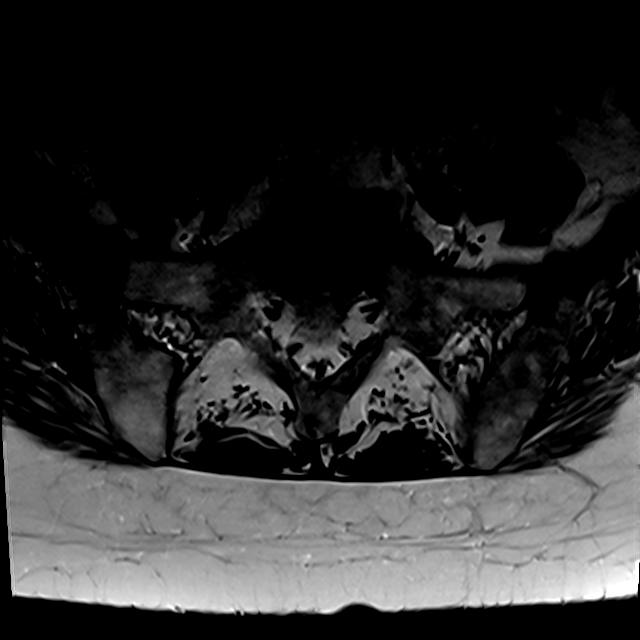
[im 6/39]
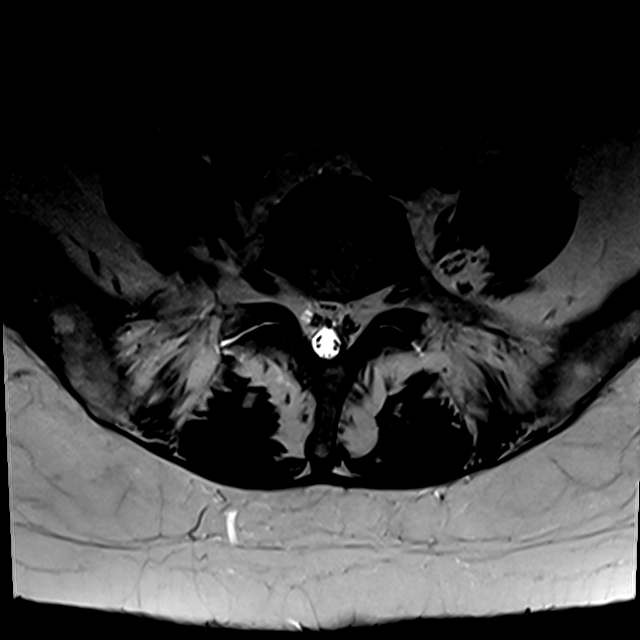
[im 11/39]
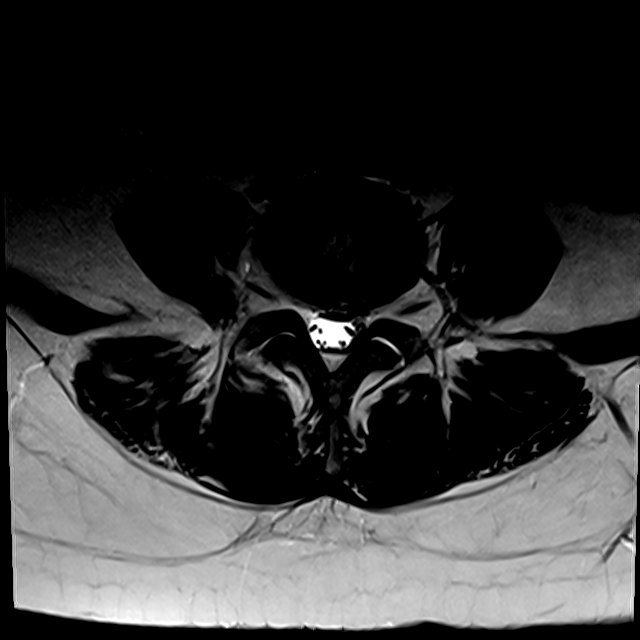
[im 17/39]
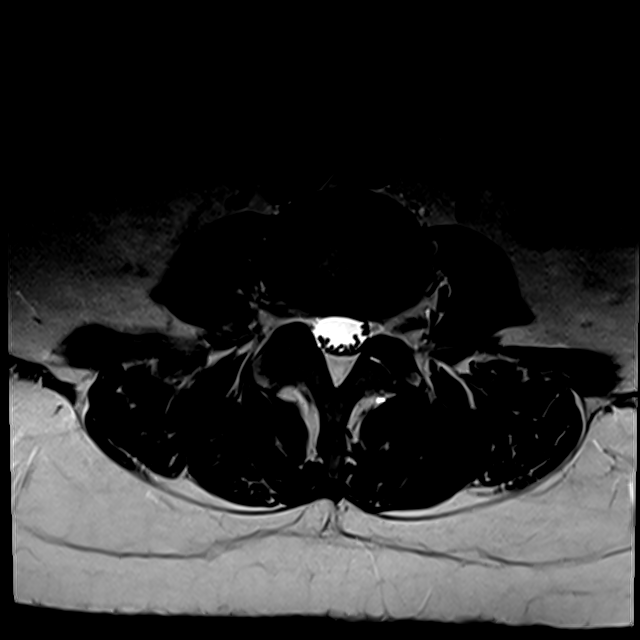
[im 20/39]
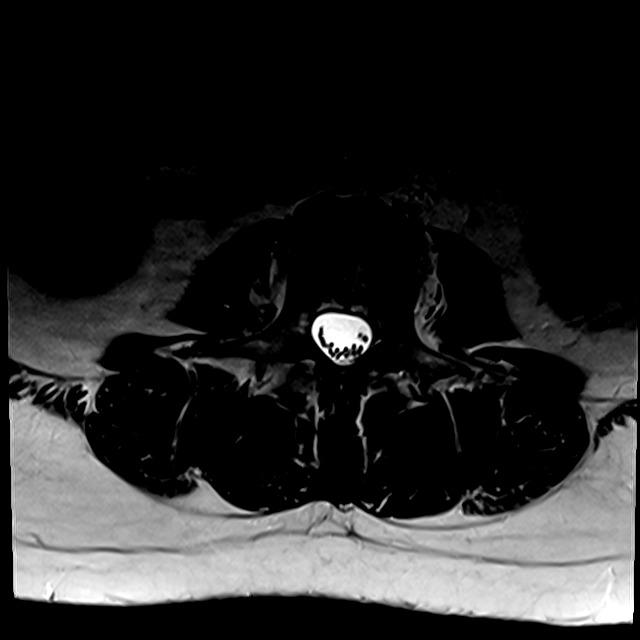
[im 22/39]
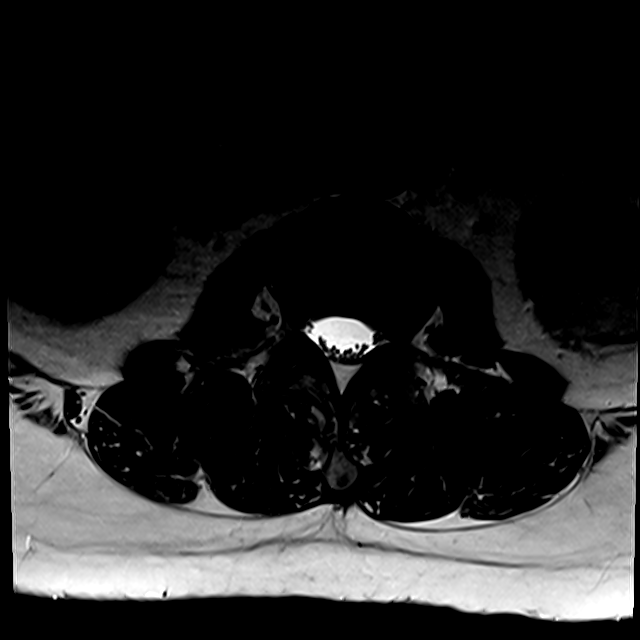
[im 28/39]
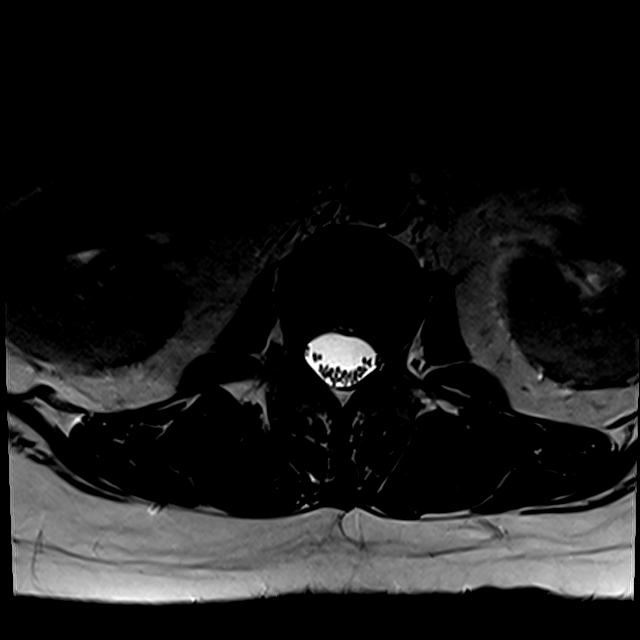
[im 33/39]
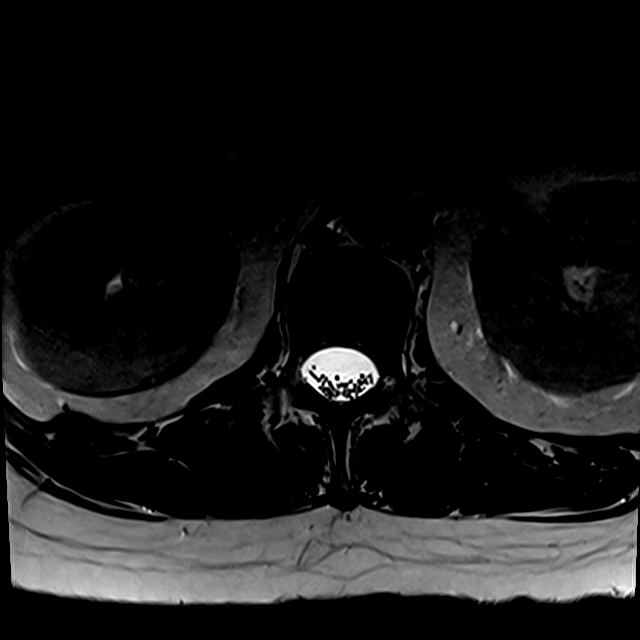

[20 of 48 positions shown; findings below may reference images not displayed]

FINDINGS: Segmentation:  Standard.

Alignment:  Physiologic.

Vertebrae:  No fracture, evidence of discitis, or bone lesion.

Conus medullaris: Extends to the L1 level and appears normal.

Paraspinal and other soft tissues: Negative.

Disc levels:

T12-L1: Normal disc space. No spinal canal stenosis. No neural
foraminal stenosis. Normal facets.

L1-L2: Normal disc space. No spinal canal stenosis. No neural
foraminal stenosis. Normal facets.

L2-L3: Small left foraminal protrusion No spinal canal stenosis. No
neural foraminal stenosis. Normal facets.

L3-L4: Right foraminal disc protrusion with annular fissure. No
spinal canal stenosis. No neural foraminal stenosis. Normal facets.

L4-L5: Small central disc protrusion, unchanged. Mild left lateral
recess narrowing. No spinal canal stenosis. No neural foraminal
stenosis. Normal facets.

L5-S1: Medium-sized left subarticular protrusion, unchanged. Mild
narrowing of the left lateral recess. No spinal canal stenosis. No
neural foraminal stenosis. Normal facets.

Visualized sacrum: Normal.
IMPRESSION: 1. Unchanged left subarticular disc protrusion at L5-S1 narrowing
the left lateral recess. Correlate for left S1 radiculopathy.
2. Small central disc protrusion at L4-L5, unchanged, narrowing the
left lateral recess. Correlate for left L5 radiculopathy.
3. Right foraminal disc protrusion at L3-L4 appears less prominent.
No associated neural impingement.

## 2018-03-18 ENCOUNTER — Emergency Department (HOSPITAL_BASED_OUTPATIENT_CLINIC_OR_DEPARTMENT_OTHER)
Admission: EM | Admit: 2018-03-18 | Discharge: 2018-03-18 | Disposition: A | Discharge status: Home / Self Care | Payer: BLUE CROSS/BLUE SHIELD | Attending: Emergency Medicine | Admitting: Emergency Medicine

## 2018-03-18 ENCOUNTER — Encounter (HOSPITAL_BASED_OUTPATIENT_CLINIC_OR_DEPARTMENT_OTHER): Payer: Self-pay

## 2018-03-18 ENCOUNTER — Ambulatory Visit: Payer: Self-pay | Admitting: *Deleted

## 2018-03-18 ENCOUNTER — Other Ambulatory Visit: Payer: Self-pay

## 2018-03-18 ENCOUNTER — Emergency Department (HOSPITAL_BASED_OUTPATIENT_CLINIC_OR_DEPARTMENT_OTHER): Payer: BLUE CROSS/BLUE SHIELD

## 2018-03-18 DIAGNOSIS — S069X1A Unspecified intracranial injury with loss of consciousness of 30 minutes or less, initial encounter: Secondary | ICD-10-CM | POA: Diagnosis not present

## 2018-03-18 DIAGNOSIS — W010XXA Fall on same level from slipping, tripping and stumbling without subsequent striking against object, initial encounter: Secondary | ICD-10-CM | POA: Diagnosis not present

## 2018-03-18 DIAGNOSIS — Y939 Activity, unspecified: Secondary | ICD-10-CM | POA: Diagnosis not present

## 2018-03-18 DIAGNOSIS — Z96642 Presence of left artificial hip joint: Secondary | ICD-10-CM | POA: Diagnosis not present

## 2018-03-18 DIAGNOSIS — I10 Essential (primary) hypertension: Secondary | ICD-10-CM | POA: Insufficient documentation

## 2018-03-18 DIAGNOSIS — Z87891 Personal history of nicotine dependence: Secondary | ICD-10-CM | POA: Insufficient documentation

## 2018-03-18 DIAGNOSIS — R55 Syncope and collapse: Secondary | ICD-10-CM

## 2018-03-18 DIAGNOSIS — S060X1A Concussion with loss of consciousness of 30 minutes or less, initial encounter: Secondary | ICD-10-CM | POA: Diagnosis not present

## 2018-03-18 DIAGNOSIS — S0990XA Unspecified injury of head, initial encounter: Secondary | ICD-10-CM | POA: Diagnosis present

## 2018-03-18 DIAGNOSIS — Y999 Unspecified external cause status: Secondary | ICD-10-CM | POA: Diagnosis not present

## 2018-03-18 DIAGNOSIS — Y929 Unspecified place or not applicable: Secondary | ICD-10-CM | POA: Diagnosis not present

## 2018-03-18 DIAGNOSIS — R51 Headache: Secondary | ICD-10-CM | POA: Diagnosis not present

## 2018-03-18 LAB — CBC WITH DIFFERENTIAL/PLATELET
BASOS ABS: 0 10*3/uL (ref 0.0–0.1)
BASOS PCT: 1 %
Eosinophils Absolute: 0 10*3/uL (ref 0.0–0.7)
Eosinophils Relative: 1 %
HCT: 37.9 % (ref 36.0–46.0)
Hemoglobin: 12.4 g/dL (ref 12.0–15.0)
LYMPHS PCT: 36 %
Lymphs Abs: 1.4 10*3/uL (ref 0.7–4.0)
MCH: 32 pg (ref 26.0–34.0)
MCHC: 32.7 g/dL (ref 30.0–36.0)
MCV: 97.9 fL (ref 78.0–100.0)
Monocytes Absolute: 0.4 10*3/uL (ref 0.1–1.0)
Monocytes Relative: 10 %
NEUTROS ABS: 2.1 10*3/uL (ref 1.7–7.7)
Neutrophils Relative %: 54 %
PLATELETS: 234 10*3/uL (ref 150–400)
RBC: 3.87 MIL/uL (ref 3.87–5.11)
RDW: 13.7 % (ref 11.5–15.5)
WBC: 4 10*3/uL (ref 4.0–10.5)

## 2018-03-18 LAB — BASIC METABOLIC PANEL
ANION GAP: 11 (ref 5–15)
BUN: 8 mg/dL (ref 6–20)
CALCIUM: 8.8 mg/dL — AB (ref 8.9–10.3)
CO2: 21 mmol/L — ABNORMAL LOW (ref 22–32)
Chloride: 104 mmol/L (ref 98–111)
Creatinine, Ser: 0.49 mg/dL (ref 0.44–1.00)
GFR calc Af Amer: >60 mL/min (ref >60)
Glucose, Bld: 171 mg/dL — ABNORMAL HIGH (ref 70–99)
POTASSIUM: 3.5 mmol/L (ref 3.5–5.1)
SODIUM: 136 mmol/L (ref 135–145)

## 2018-03-18 LAB — PREGNANCY, URINE: PREG TEST UR: NEGATIVE

## 2018-03-18 MED ORDER — METOCLOPRAMIDE HCL 10 MG PO TABS: 10.0000 mg | ORAL_TABLET | Freq: Four times a day (QID) | ORAL | 0 refills | Status: DC | PRN

## 2018-03-18 MED ORDER — DIPHENHYDRAMINE HCL 50 MG/ML IJ SOLN
25.0000 mg | Freq: Once | INTRAMUSCULAR | Status: DC
  Filled 2018-03-18: qty 1

## 2018-03-18 MED ORDER — DEXAMETHASONE SODIUM PHOSPHATE 10 MG/ML IJ SOLN
10.0000 mg | Freq: Once | INTRAMUSCULAR | Status: AC
  Administered 2018-03-18: 10 mg via INTRAVENOUS
  Filled 2018-03-18: qty 1

## 2018-03-18 MED ORDER — METOCLOPRAMIDE HCL 5 MG/ML IJ SOLN
10.0000 mg | Freq: Once | INTRAMUSCULAR | Status: AC
Start: 2018-03-18 — End: 2018-03-18
  Administered 2018-03-18: 10 mg via INTRAVENOUS
  Filled 2018-03-18: qty 2

## 2018-03-18 MED FILL — METOCLOPRAMIDE 10 MG TABLET: 10 | 7 days supply | Qty: 30 | Fill #0

## 2018-03-18 NOTE — ED Notes (Signed)
States she  Slipped onBR floor at  About 1 am and  Then she woke up on floor  In the  br then went to bedroom and passed out again, realized she hit her head this am  Had to craw around bed to get back in bed ,  This happened early Monday am  Has bruise on her  Left forehead

## 2018-03-18 NOTE — ED Triage Notes (Signed)
Pt slipped/fell in her BR approx 1am 8/4-reports + LOC-bruise noted to left forehead-pt with cont'd HA-NAD-steady gait

## 2018-03-18 NOTE — ED Provider Notes (Signed)
Emergency Department Provider Note   I have reviewed the triage vital signs and the nursing notes.   HISTORY  Chief Complaint Head Injury   HPI Lisa [name] is a 47 y.o. female presents to the ED for evaluation of head injury and syncope. Event occurred 2 days prior. Patient believes that she slipped on the floor and fell hitting her forehead. He reports a possible LOC. She stood from the floor to walk back to her bed and suddenly felt lightheaded with resulting syncope. She was sparks in her vision. Denies and CP, SOB, or palpitations. No fever or chills. She has had a persistent HA for the last 2 days. She went to work and coworkers saw the forehead hematoma so encouraged her to present to the ED.   Past Medical History:  Diagnosis Date  . Anemia    hx of  . Anxiety 20 years ago  . Depression 20 years ago  . GERD (gastroesophageal reflux disease)   . Heart murmur   . Hyperlipidemia   . Hypertension    history of htn, no meds now  . Ulcer    x2  . Vaginal Pap smear, abnormal     Patient Active Problem List   Diagnosis Date Noted  . Fecal incontinence 01/15/2018  . Pre-diabetes 02/14/2017  . Left anterior cruciate ligament tear 08/03/2016  . Acute medial meniscus tear of left knee 08/03/2016  . Acute lateral meniscus tear of left knee 08/03/2016  . History of abnormal cervical Pap smear 09/21/2013  . Leukocytopenia, unspecified 03/02/2013  . S/P hip replacement 02/06/2013  . Hyperlipidemia 10/13/2012  . HTN (hypertension) 10/13/2012    Past Surgical History:  Procedure Laterality Date  . ANTERIOR CRUCIATE LIGAMENT REPAIR Left 09/04/2016   Procedure: LEFT KNEE MEDIAL COLLATERAL LIGAMENT REPAIR, ANTERIOR CRUCIATE LIGAMENT RECONSTRUCTION, MENISCAL DEBRIDEMENT VERSUS REPAIR;  Surgeon: Cammy Copa, MD;  Location: [facility];  Service: Orthopedics;  Laterality: Left;  . ESOPHAGOGASTRODUODENOSCOPY ENDOSCOPY     at least 4  . LAPAROSCOPIC APPENDECTOMY N/A 02/23/2015   Procedure: APPENDECTOMY LAPAROSCOPIC;  Surgeon: Gaynelle Adu, MD;  Location: [facility];  Service: General;  Laterality: N/A;  . TOTAL HIP ARTHROPLASTY Left 02/06/2013   Procedure: LEFT TOTAL HIP ARTHROPLASTY ANTERIOR APPROACH;  Surgeon: Kathryne Hitch, MD;  Location: [facility];  Service: Orthopedics;  Laterality: Left;    Allergies Nsaids  Family History  Adopted: Yes    Social History Social History   Tobacco Use  . Smoking status: Former Smoker    Packs/day: 0.25    Years: 20.00    Pack years: 5.00    Types: Cigarettes    Last attempt to quit: 08/13/2013    Years since quitting: 4.5  . Smokeless tobacco: Never Used  Substance Use Topics  . Alcohol use: Yes    Comment: few glasses of wine a week  . Drug use: No    Review of Systems  Constitutional: No fever/chills Eyes: No visual changes. ENT: No sore throat. Cardiovascular: Denies chest pain. Positive syncope.  Respiratory: Denies shortness of breath. Gastrointestinal: No abdominal pain.  No nausea, no vomiting.  No diarrhea.  No constipation. Genitourinary: Negative for dysuria. Musculoskeletal: Negative for back pain. Skin: Negative for rash. Neurological: Negative for focal weakness or numbness. Positive HA.   10-point ROS otherwise negative.  ____________________________________________   PHYSICAL EXAM:  VITAL SIGNS: ED Triage Vitals  Enc Vitals Group     BP 03/18/18 1149 (!) 139/92     Pulse Rate  03/18/18 1149 (!) 102     Resp 03/18/18 1149 18     Temp 03/18/18 1149 98.2 F (36.8 C)     Temp Source 03/18/18 1149 Oral     SpO2 03/18/18 1149 99 %     Weight 03/18/18 1149 167 lb 8.8 oz (76 kg)     Height 03/18/18 1149 5\' 8"  (1.727 m)     Pain Score 03/18/18 1154 4   Constitutional: Alert and oriented. Well appearing and in no acute distress. Eyes: Conjunctivae are normal. PERRL. Head: Ecchymosis and bruising over the left forehead. No abrasion or laceration.  Nose: No  congestion/rhinnorhea. Mouth/Throat: Mucous membranes are moist.  Neck: No stridor. No cervical spine tenderness to palpation. Cardiovascular: Normal rate, regular rhythm. Good peripheral circulation. Grossly normal heart sounds.   Respiratory: Normal respiratory effort.  No retractions. Lungs CTAB. Gastrointestinal: Soft and nontender. No distention.  Musculoskeletal: No lower extremity tenderness nor edema. No gross deformities of extremities. Neurologic:  Normal speech and language. No gross focal neurologic deficits are appreciated.  Skin:  Skin is warm, dry and intact. No rash noted.  ____________________________________________   LABS (all labs ordered are listed, but only abnormal results are displayed)  Labs Reviewed  BASIC METABOLIC PANEL - Abnormal; Notable for the following components:      Result Value   CO2 21 (*)    Glucose, Bld 171 (*)    Calcium 8.8 (*)    All other components within normal limits  PREGNANCY, URINE  CBC WITH DIFFERENTIAL/PLATELET   ____________________________________________  EKG   EKG Interpretation  Date/Time:  Tuesday March 18 2018 13:31:46 EDT Ventricular Rate:  87 PR Interval:    QRS Duration: 95 QT Interval:  388 QTC Calculation: 467 R Axis:   106 Text Interpretation:  Sinus rhythm Right axis deviation Baseline wander in lead(s) I III aVL V2 V6 No STEMI.  Confirmed by Alona BeneLong, Tyona Nilsen [phone]) on 03/18/2018 1:46:19 PM       ____________________________________________  RADIOLOGY  Ct Head Wo Contrast  Result Date: 03/18/2018 CLINICAL DATA:  Headache since falling at home on Sunday and hitting lt forehead, loc. Contusion, swelling to lt forehead. EXAM: CT HEAD WITHOUT CONTRAST TECHNIQUE: Contiguous axial images were obtained from the base of the skull through the vertex without intravenous contrast. COMPARISON:  08/26/2009 FINDINGS: Brain: No evidence of acute infarction, hemorrhage, hydrocephalus, extra-axial collection or mass  lesion/mass effect. Vascular: No hyperdense vessel or unexpected calcification. Skull: Normal. Negative for fracture or focal lesion. Sinuses/Orbits: No acute finding. Other: There is LEFT frontal scalp edema not associated with fracture. IMPRESSION: 1.  No evidence for acute intracranial abnormality. 2. LEFT frontal scalp edema. Electronically Signed   By: Norva PavlovElizabeth  Brown M.D.   On: 03/18/2018 13:34    ____________________________________________   PROCEDURES  Procedure(s) performed:   Procedures  None ____________________________________________   INITIAL IMPRESSION / ASSESSMENT AND PLAN / ED COURSE  Pertinent labs & imaging results that were available during my care of the patient were reviewed by me and considered in my medical decision making (see chart for details).  Patient presents to the ED with head injury and resulting syncope. The first head injury appears to be slip-and-fall type mechanism but the resulting syncope is more concerning. Possibly orthostatic vs vasovagal but cannot exclude cardiogenic etiology. No head injury on CT head. Suspect concussion clinically. Advised PCP follow up and referral to Neurology if post-concussion symptoms continue.   Labs and EKG reviewed. No acute findings. Plan for Cardiology follow  up. Reglan and Benadryl given for HA. Cannot have Toradol with bleeding ulcer history.   At this time, I do not feel there is any life-threatening condition present. I have reviewed and discussed all results (EKG, imaging, lab, urine as appropriate), exam findings with patient. I have reviewed nursing notes and appropriate previous records.  I feel the patient is safe to be discharged home without further emergent workup. Discussed usual and customary return precautions. Patient and family (if present) verbalize understanding and are comfortable with this plan.  Patient will follow-up with their primary care provider. If they do not have a primary care provider,  information for follow-up has been provided to them. All questions have been answered.    ____________________________________________  FINAL CLINICAL IMPRESSION(S) / ED DIAGNOSES  Final diagnoses:  Injury of head, initial encounter  Concussion with loss of consciousness of 30 minutes or less, initial encounter  Syncope, unspecified syncope type     MEDICATIONS GIVEN DURING THIS VISIT:  Medications  metoCLOPramide (REGLAN) injection 10 mg (10 mg Intravenous Given 03/18/18 1407)  dexamethasone (DECADRON) injection 10 mg (10 mg Intravenous Given 03/18/18 1407)     NEW OUTPATIENT MEDICATIONS STARTED DURING THIS VISIT:  Discharge Medication List as of 03/18/2018  2:35 PM    START taking these medications   Details  metoCLOPramide (REGLAN) 10 MG tablet Take 1 tablet (10 mg total) by mouth every 6 (six) hours as needed for nausea (or headache)., Starting Tue 03/18/2018, Print        Note:  This document was prepared using Dragon voice recognition software and may include unintentional dictation errors.  Alona Bene, MD Emergency Medicine    Chandi Nicklin, Arlyss Repress, MD 03/18/18 [phone]

## 2018-03-18 NOTE — Telephone Encounter (Signed)
Pt called with having gone to the bathroom on Sunday night and slipped and hit her head and was knocked out. She got up, and went back to her bedroom saw stars and passed out for about 40 mins. On Monday she had a headache and took med for migraines. Today she is at work and using an ice pack to her bump that is located right about her left eye. She has a headache and the area is tender to touch. She has taken Tylenol for the headache. She is calling for an appointment. She drove her self to work today. Pt advised that she should be seen at a emergency department to r/o a concussion.   [facility] notified and agrees with disposition.  Pt voiced that [facility].  Reason for Disposition . [1] Knocked out (unconscious) < 1 minute AND [2] now fine  Answer Assessment - Initial Assessment Questions 1. MECHANISM: "How did the injury happen?" For falls, ask: "What height did you fall from?" and "What surface did you fall against?"      Fell in the bathroom, slipped on floor 2. ONSET: "When did the injury happen?" (Minutes or hours ago)      sunday 3. NEUROLOGIC SYMPTOMS: "Was there any loss of consciousness?" "Are there any other neurological symptoms?"      Yes, and seeing stars after getting up and walking back to bedroom 4. MENTAL STATUS: "Does the person know who he is, who you are, and where he is?"      yes 5. LOCATION: "What part of the head was hit?"      Hit head above the left eye 6. SCALP APPEARANCE: "What does the scalp look like? Is it bleeding now?" If so, ask: "Is it difficult to stop?"      no 7. SIZE: For cuts, bruises, or swelling, ask: "How large is it?" (e.g., inches or centimeters)      Bump about the size of a silver dollar 8. PAIN: "Is there any pain?" If so, ask: "How bad is it?"  (e.g., Scale 1-10; or mild, moderate, severe)     Headache with pain of # 3 9. TETANUS: For any breaks in the skin, ask: "When was the  last tetanus booster?"     no 10. OTHER SYMPTOMS: "Do you have any other symptoms?" (e.g., neck pain, vomiting)       No nausea 11. PREGNANCY: "Is there any chance you are pregnant?" "When was your last menstrual period?"       No periods on Depo  Protocols used: HEAD INJURY-A-AH

## 2018-03-18 NOTE — Discharge Instructions (Signed)

## 2018-03-19 ENCOUNTER — Encounter: Payer: Self-pay | Admitting: Family Medicine

## 2018-03-19 DIAGNOSIS — E7849 Other hyperlipidemia: Secondary | ICD-10-CM

## 2018-03-19 IMAGING — DX DG SACRUM/COCCYX 2+V
3 series · 3 of 3 positions shown · non-contrast
Comparison: CT 02/23/2015

CLINICAL DATA: Fall, tailbone pain.

EXAM:
SACRUM AND COCCYX - 2+ VIEW

[coccyx ap]
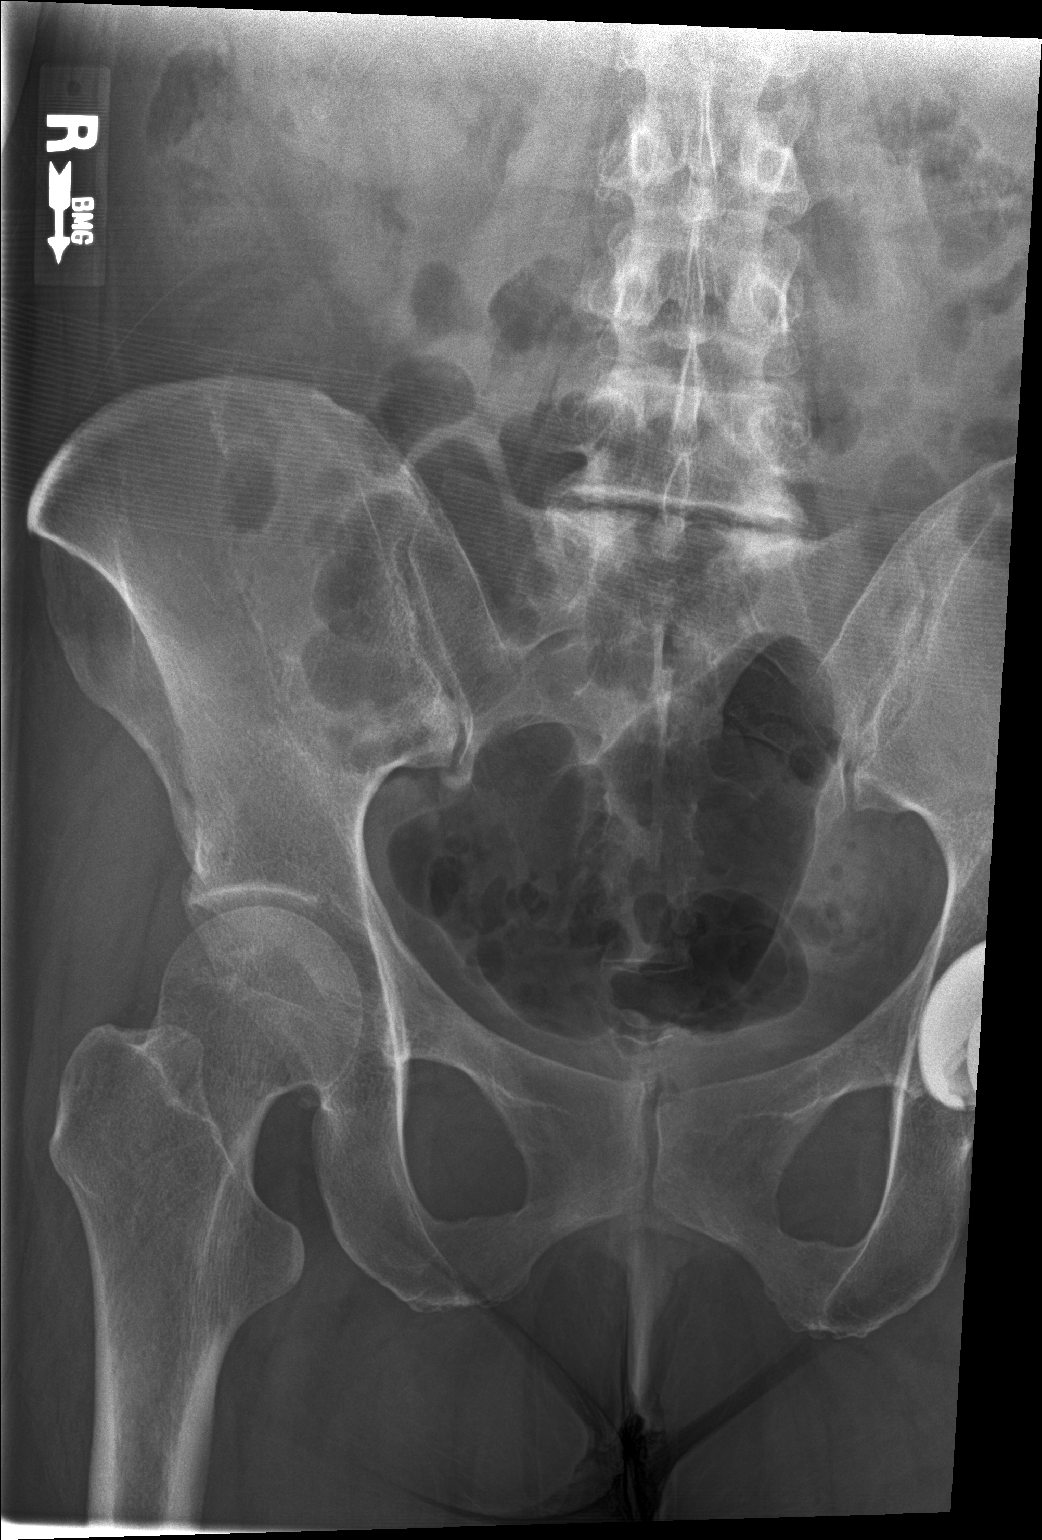

[sacrum ap]
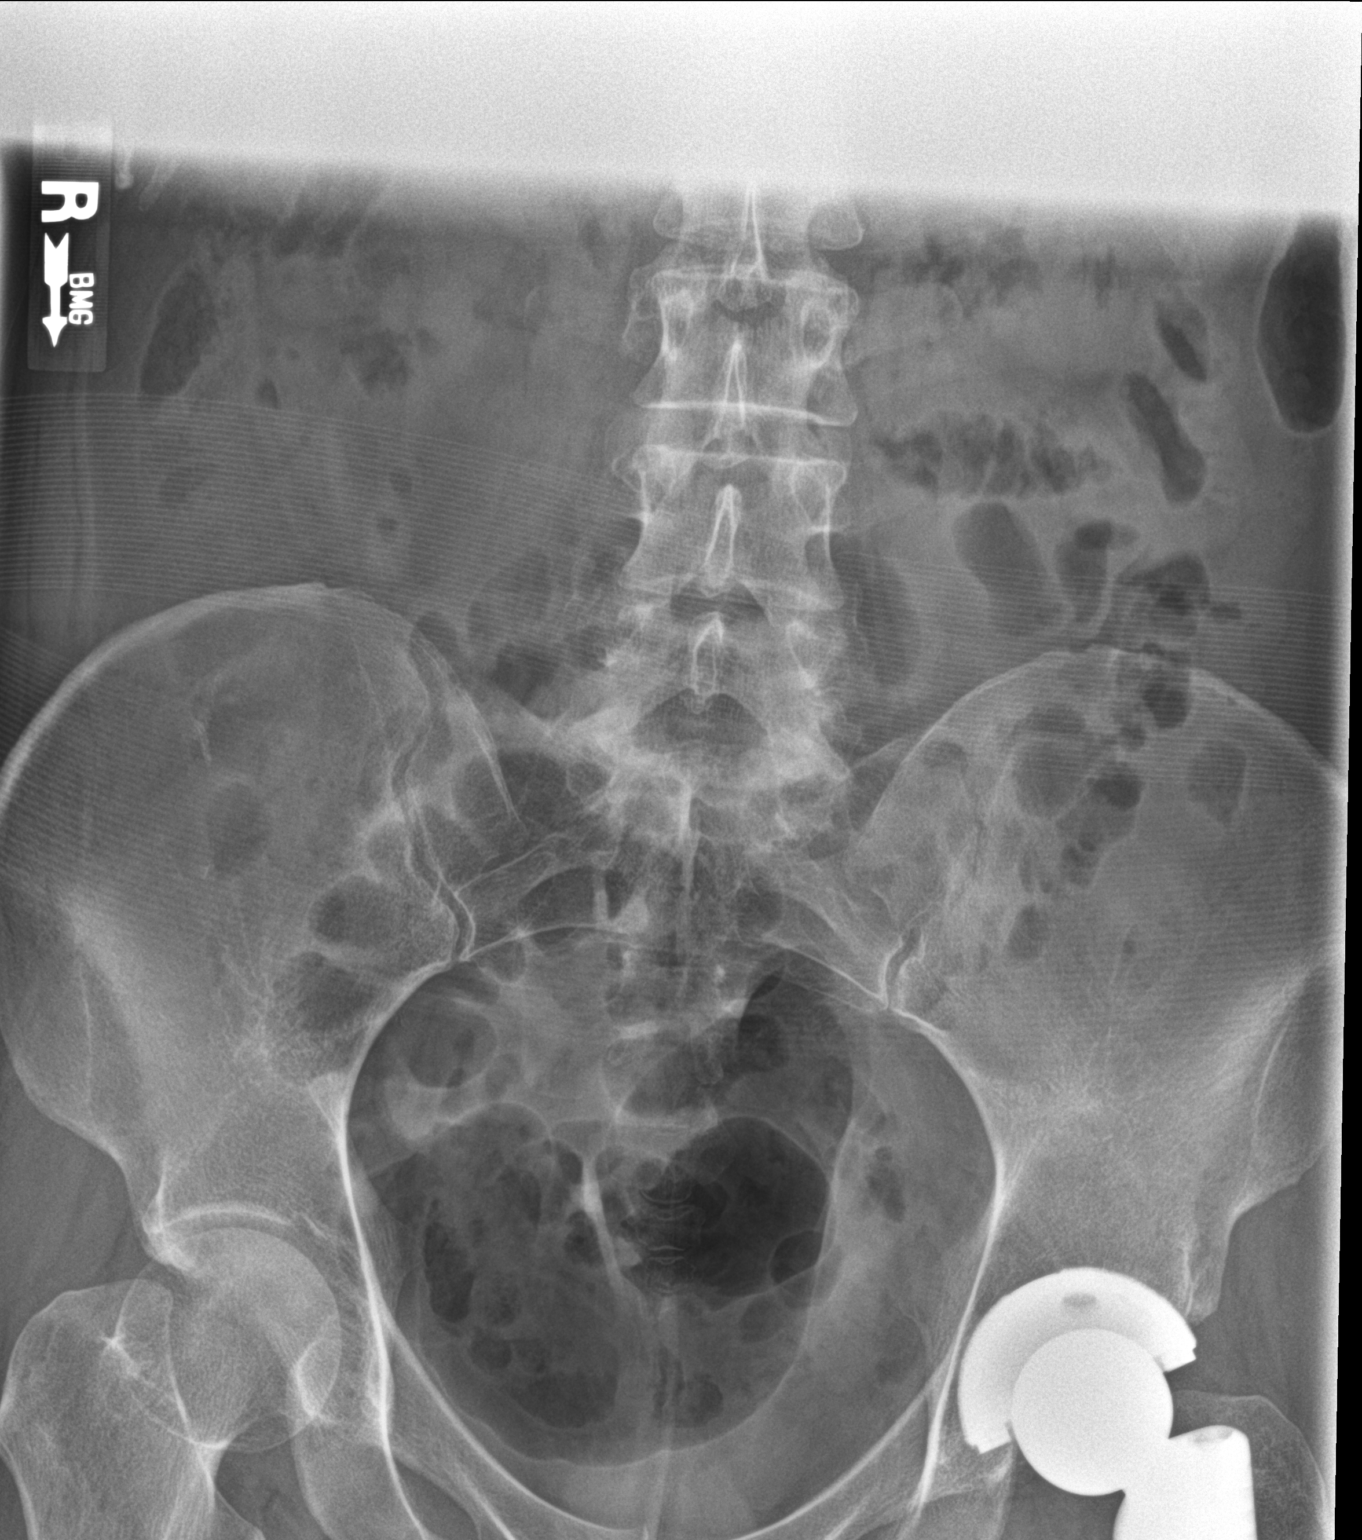

[sacrum lat]
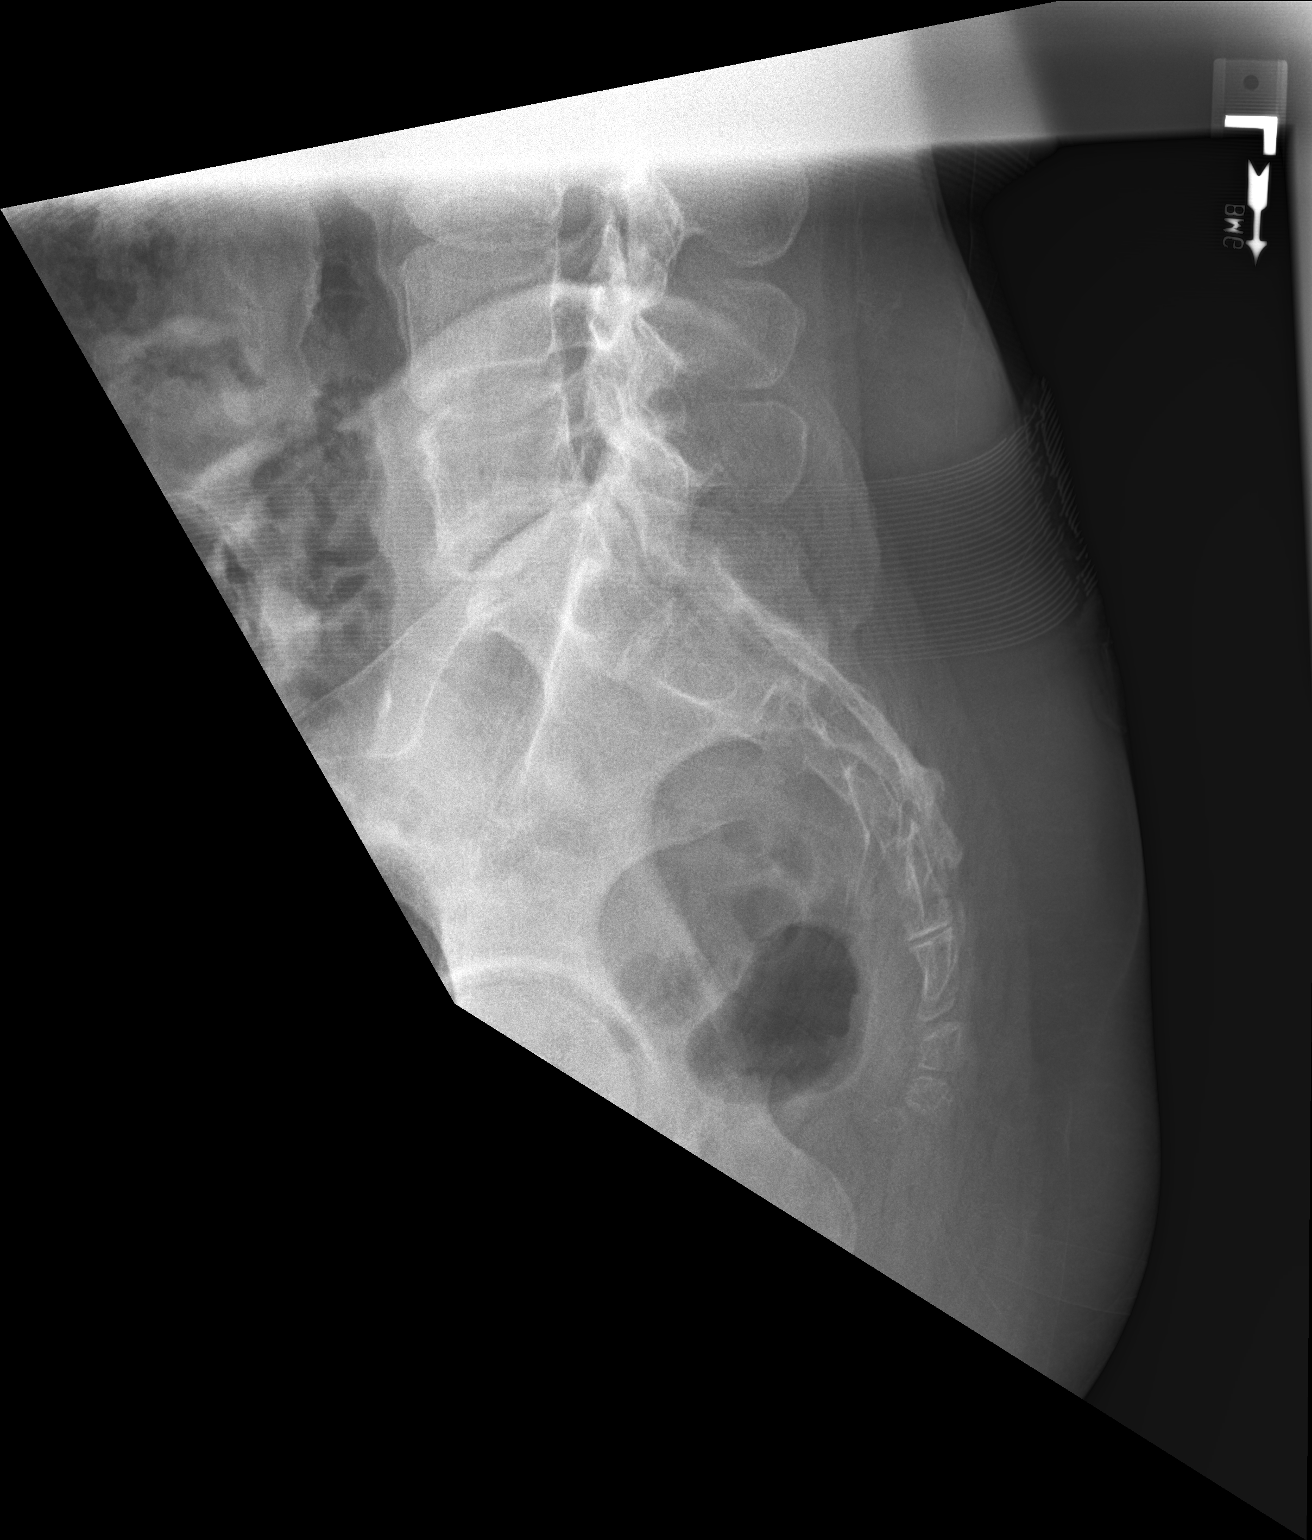

[3 of 3 positions shown; findings below may reference images not displayed]

FINDINGS: Prior left hip replacement. SI joints are symmetric and
unremarkable. No visible sacral or coccygeal fracture..
IMPRESSION: No acute bony abnormality.

## 2018-03-19 MED ORDER — TRAZODONE HCL 50 MG PO TABS: 25.0000 mg | ORAL_TABLET | Freq: Every evening | ORAL | 3 refills | Status: DC | PRN

## 2018-03-19 MED ORDER — SIMVASTATIN 20 MG PO TABS: 20.0000 mg | ORAL_TABLET | Freq: Every evening | ORAL | 3 refills | Status: DC

## 2018-04-12 ENCOUNTER — Ambulatory Visit (INDEPENDENT_AMBULATORY_CARE_PROVIDER_SITE_OTHER): Payer: BLUE CROSS/BLUE SHIELD | Admitting: Family Medicine

## 2018-04-12 DIAGNOSIS — Z3042 Encounter for surveillance of injectable contraceptive: Secondary | ICD-10-CM | POA: Diagnosis not present

## 2018-04-12 MED ORDER — MEDROXYPROGESTERONE ACETATE 150 MG/ML IM SUSP
150.0000 mg | Freq: Once | INTRAMUSCULAR | Status: AC
  Administered 2018-04-12: 150 mg via INTRAMUSCULAR

## 2018-04-12 NOTE — Progress Notes (Signed)
Nurse visit for Depo-Provera 150 mg injection in her LUOQ.  Patient is on time per depo-provera calender.   Patient will return anytime from November 16 to July 12, 2018.  She was given a calender for reference.

## 2018-04-29 ENCOUNTER — Ambulatory Visit: Payer: BLUE CROSS/BLUE SHIELD | Admitting: Family Medicine

## 2018-04-29 ENCOUNTER — Encounter: Payer: Self-pay | Admitting: Family Medicine

## 2018-04-29 VITALS — BP 128/64 | HR 86 | Ht 68.0 in | Wt 169.0 lb

## 2018-04-29 DIAGNOSIS — R0781 Pleurodynia: Secondary | ICD-10-CM

## 2018-04-29 DIAGNOSIS — R0789 Other chest pain: Secondary | ICD-10-CM | POA: Insufficient documentation
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
## 2018-04-29 NOTE — Assessment & Plan Note (Signed)
No trauma. No step off appreciated. Possible to be sprain of costochondral joints. -Provided samples of Pennsaid.  She is unable take NSAIDs with history of GI bleed. -Counseled on supportive care. -If no improvement will consider imaging

## 2018-04-29 NOTE — Patient Instructions (Signed)
Nice to meet you  Please try the medication  Please let me know if your symptoms don't improve  Please try compression on your ribs

## 2018-04-30 ENCOUNTER — Ambulatory Visit: Payer: BLUE CROSS/BLUE SHIELD | Admitting: Urgent Care

## 2018-04-30 DIAGNOSIS — H5213 Myopia, bilateral: Secondary | ICD-10-CM | POA: Diagnosis not present

## 2018-04-30 DIAGNOSIS — H40023 Open angle with borderline findings, high risk, bilateral: Secondary | ICD-10-CM | POA: Diagnosis not present

## 2018-05-01 DIAGNOSIS — R159 Full incontinence of feces: Secondary | ICD-10-CM | POA: Diagnosis not present

## 2018-05-01 DIAGNOSIS — Z1329 Encounter for screening for other suspected endocrine disorder: Secondary | ICD-10-CM | POA: Diagnosis not present

## 2018-05-01 DIAGNOSIS — R197 Diarrhea, unspecified: Secondary | ICD-10-CM | POA: Diagnosis not present

## 2018-05-14 DIAGNOSIS — Z23 Encounter for immunization: Secondary | ICD-10-CM | POA: Diagnosis not present

## 2018-05-26 DIAGNOSIS — R159 Full incontinence of feces: Secondary | ICD-10-CM | POA: Diagnosis not present

## 2018-05-26 DIAGNOSIS — K6289 Other specified diseases of anus and rectum: Secondary | ICD-10-CM | POA: Diagnosis not present

## 2018-06-09 ENCOUNTER — Encounter: Payer: Self-pay | Admitting: Family Medicine

## 2018-06-09 MED ORDER — PANTOPRAZOLE SODIUM 40 MG PO TBEC
40.0000 mg | DELAYED_RELEASE_TABLET | Freq: Every day | ORAL | 3 refills | Status: DC
Start: 2018-06-09 — End: 2019-09-25

## 2018-07-01 DIAGNOSIS — Z1231 Encounter for screening mammogram for malignant neoplasm of breast: Secondary | ICD-10-CM | POA: Diagnosis not present

## 2018-07-07 DIAGNOSIS — N390 Urinary tract infection, site not specified: Secondary | ICD-10-CM | POA: Diagnosis not present

## 2018-07-08 DIAGNOSIS — R159 Full incontinence of feces: Secondary | ICD-10-CM | POA: Diagnosis not present

## 2018-07-11 ENCOUNTER — Ambulatory Visit (INDEPENDENT_AMBULATORY_CARE_PROVIDER_SITE_OTHER): Payer: BLUE CROSS/BLUE SHIELD | Admitting: Emergency Medicine

## 2018-07-11 DIAGNOSIS — Z309 Encounter for contraceptive management, unspecified: Secondary | ICD-10-CM

## 2018-07-11 MED ORDER — MEDROXYPROGESTERONE ACETATE 150 MG/ML IM SUSP
150.0000 mg | Freq: Once | INTRAMUSCULAR | Status: AC
  Administered 2018-07-11: 150 mg via INTRAMUSCULAR

## 2018-07-11 MED ORDER — MEDROXYPROGESTERONE ACETATE 150 MG/ML IM SUSP: 150.0000 mg | Freq: Once | INTRAMUSCULAR | Status: DC

## 2018-07-11 NOTE — Progress Notes (Signed)
Nurse visit for Depo Provera only.  She received the injection in her left arm (deltoid) due to complications of her gluteus area.   Patient was in schedule window time.  She was provider with a new calender and advised to return September 26, 2018 through October 10, 2018.  Patient tolerated injection well and was released with no signs of distress or adverse effects.

## 2018-07-14 DIAGNOSIS — R159 Full incontinence of feces: Secondary | ICD-10-CM | POA: Diagnosis not present

## 2018-08-04 DIAGNOSIS — R159 Full incontinence of feces: Secondary | ICD-10-CM | POA: Diagnosis not present

## 2018-08-13 HISTORY — PX: SPINAL CORD STIMULATOR IMPLANT: SHX2422

## 2018-08-22 DIAGNOSIS — H401133 Primary open-angle glaucoma, bilateral, severe stage: Secondary | ICD-10-CM | POA: Diagnosis not present

## 2018-09-16 DIAGNOSIS — R159 Full incontinence of feces: Secondary | ICD-10-CM | POA: Diagnosis not present

## 2018-09-18 ENCOUNTER — Other Ambulatory Visit: Payer: Self-pay | Admitting: Family Medicine

## 2018-09-18 DIAGNOSIS — R197 Diarrhea, unspecified: Secondary | ICD-10-CM

## 2018-09-19 ENCOUNTER — Other Ambulatory Visit: Payer: Self-pay

## 2018-09-19 DIAGNOSIS — R197 Diarrhea, unspecified: Secondary | ICD-10-CM

## 2018-09-19 MED ORDER — DIPHENOXYLATE-ATROPINE 2.5-0.025 MG PO TABS: 1.0000 | ORAL_TABLET | Freq: Four times a day (QID) | ORAL | 1 refills | Status: DC | PRN

## 2018-09-29 ENCOUNTER — Encounter: Payer: Self-pay | Admitting: Family Medicine

## 2018-09-29 ENCOUNTER — Ambulatory Visit: Payer: BLUE CROSS/BLUE SHIELD | Admitting: Family Medicine

## 2018-09-29 ENCOUNTER — Other Ambulatory Visit: Payer: Self-pay

## 2018-09-29 VITALS — BP 146/82 | HR 110 | Temp 99.0°F | Resp 17 | Ht 68.0 in | Wt 164.6 lb

## 2018-09-29 DIAGNOSIS — R6889 Other general symptoms and signs: Secondary | ICD-10-CM

## 2018-09-29 DIAGNOSIS — J069 Acute upper respiratory infection, unspecified: Secondary | ICD-10-CM | POA: Diagnosis not present

## 2018-09-29 LAB — POC INFLUENZA A&B (BINAX/QUICKVUE)
INFLUENZA B, POC: NEGATIVE
Influenza A, POC: NEGATIVE

## 2018-09-29 NOTE — Patient Instructions (Signed)
Upper Respiratory Infection, Adult An upper respiratory infection (URI) is a common viral infection of the nose, throat, and upper air passages that lead to the lungs. The most common type of URI is the common cold. URIs usually get better on their own, without medical treatment. What are the causes? A URI is caused by a virus. You may catch a virus by:  Breathing in droplets from an infected person's cough or sneeze.  Touching something that has been exposed to the virus (contaminated) and then touching your mouth, nose, or eyes. What increases the risk? You are more likely to get a URI if:  You are very young or very old.  It is autumn or winter.  You have close contact with others, such as at a daycare, school, or health care facility.  You smoke.  You have long-term (chronic) heart or lung disease.  You have a weakened disease-fighting (immune) system.  You have nasal allergies or asthma.  You are experiencing a lot of stress.  You work in an area that has poor air circulation.  You have poor nutrition. What are the signs or symptoms? A URI usually involves some of the following symptoms:  Runny or stuffy (congested) nose.  Sneezing.  Cough.  Sore throat.  Headache.  Fatigue.  Fever.  Loss of appetite.  Pain in your forehead, behind your eyes, and over your cheekbones (sinus pain).  Muscle aches.  Redness or irritation of the eyes.  Pressure in the ears or face. How is this diagnosed? This condition may be diagnosed based on your medical history and symptoms, and a physical exam. Your health care provider may use a cotton swab to take a mucus sample from your nose (nasal swab). This sample can be tested to determine what virus is causing the illness. How is this treated? URIs usually get better on their own within 7-10 days. You can take steps at home to relieve your symptoms. Medicines cannot cure URIs, but your health care provider may recommend  certain medicines to help relieve symptoms, such as:  Over-the-counter cold medicines.  Cough suppressants. Coughing is a type of defense against infection that helps to clear the respiratory system, so take these medicines only as recommended by your health care provider.  Fever-reducing medicines. Follow these instructions at home: Activity  Rest as needed.  If you have a fever, stay home from work or school until your fever is gone or until your health care provider says you are no longer contagious. Your health care provider may have you wear a face mask to prevent your infection from spreading. Relieving symptoms  Gargle with a salt-water mixture 3-4 times a day or as needed. To make a salt-water mixture, completely dissolve -1 tsp of salt in 1 cup of warm water.  Use a cool-mist humidifier to add moisture to the air. This can help you breathe more easily. Eating and drinking   Drink enough fluid to keep your urine pale yellow.  Eat soups and other clear broths. General instructions   Take over-the-counter and prescription medicines only as told by your health care provider. These include cold medicines, fever reducers, and cough suppressants.  Do not use any products that contain nicotine or tobacco, such as cigarettes and e-cigarettes. If you need help quitting, ask your health care provider.  Stay away from secondhand smoke.  Stay up to date on all immunizations, including the yearly (annual) flu vaccine.  Keep all follow-up visits as told by your health   care provider. This is important. How to prevent the spread of infection to others   URIs can be passed from person to person (are contagious). To prevent the infection from spreading: ? Wash your hands often with soap and water. If soap and water are not available, use hand sanitizer. ? Avoid touching your mouth, face, eyes, or nose. ? Cough or sneeze into a tissue or your sleeve or elbow instead of into your hand  or into the air. Contact a health care provider if:  You are getting worse instead of better.  You have a fever or chills.  Your mucus is brown or red.  You have yellow or brown discharge coming from your nose.  You have pain in your face, especially when you bend forward.  You have swollen neck glands.  You have pain while swallowing.  You have white areas in the back of your throat. Get help right away if:  You have shortness of breath that gets worse.  You have severe or persistent: ? Headache. ? Ear pain. ? Sinus pain. ? Chest pain.  You have chronic lung disease along with any of the following: ? Wheezing. ? Prolonged cough. ? Coughing up blood. ? A change in your usual mucus.  You have a stiff neck.  You have changes in your: ? Vision. ? Hearing. ? Thinking. ? Mood. Summary  An upper respiratory infection (URI) is a common infection of the nose, throat, and upper air passages that lead to the lungs.  A URI is caused by a virus.  URIs usually get better on their own within 7-10 days.  Medicines cannot cure URIs, but your health care provider may recommend certain medicines to help relieve symptoms. This information is not intended to replace advice given to you by your health care provider. Make sure you discuss any questions you have with your health care provider. Document Released: 01/23/2001 Document Revised: 03/15/2017 Document Reviewed: 03/15/2017 Elsevier Interactive Patient Education  2019 [employer]    

## 2018-09-29 NOTE — Progress Notes (Signed)
Established Patient Office Visit  Subjective:  Patient ID: Lisa [name], female    DOB: February 18, 1971  Age: 48 y.o. MRN: [id number]  CC:  Chief Complaint  Patient presents with  . ? flu symptoms    pt got flu vaccine, fevers,chills,sweats, fatigue, loss of appetite, rn and cough.  onset: fever 2 pm yesterday and loss appetite started friday/saturday    HPI Lisa [name] presents for   Pt reports that she has been having fevers for 24 hours and also noted appetite changes 2 days ago She reports that she has been very cold and having sweats and chills as well as body aches and headaches She reports that she has a fast pulse  She states that she has a coworker who also was sick No history of asthma Currently on keflex for a skin incision   Past Medical History:  Diagnosis Date  . Anemia    hx of  . Anxiety 20 years ago  . Depression 20 years ago  . GERD (gastroesophageal reflux disease)   . Heart murmur   . Hyperlipidemia   . Hypertension    history of htn, no meds now  . Ulcer    x2  . Vaginal Pap smear, abnormal     Past Surgical History:  Procedure Laterality Date  . ANTERIOR CRUCIATE LIGAMENT REPAIR Left 09/04/2016   Procedure: LEFT KNEE MEDIAL COLLATERAL LIGAMENT REPAIR, ANTERIOR CRUCIATE LIGAMENT RECONSTRUCTION, MENISCAL DEBRIDEMENT VERSUS REPAIR;  Surgeon: Cammy Copa, MD;  Location: [facility];  Service: Orthopedics;  Laterality: Left;  . ESOPHAGOGASTRODUODENOSCOPY ENDOSCOPY     at least 4  . LAPAROSCOPIC APPENDECTOMY N/A 02/23/2015   Procedure: APPENDECTOMY LAPAROSCOPIC;  Surgeon: Gaynelle Adu, MD;  Location: [facility];  Service: General;  Laterality: N/A;  . TOTAL HIP ARTHROPLASTY Left 02/06/2013   Procedure: LEFT TOTAL HIP ARTHROPLASTY ANTERIOR APPROACH;  Surgeon: Kathryne Hitch, MD;  Location: [facility];  Service: Orthopedics;  Laterality: Left;    Family History  Adopted: Yes    Social History   Socioeconomic History  . Marital status: Single   Spouse name: Not on file  . Number of children: Not on file  . Years of education: Not on file  . Highest education level: Not on file  Occupational History  . Not on file  Social Needs  . Financial resource strain: Not on file  . Food insecurity:    Worry: Not on file    Inability: Not on file  . Transportation needs:    Medical: Not on file    Non-medical: Not on file  Tobacco Use  . Smoking status: Former Smoker    Packs/day: 0.25    Years: 20.00    Pack years: 5.00    Types: Cigarettes    Last attempt to quit: 08/13/2013    Years since quitting: 5.1  . Smokeless tobacco: Never Used  Substance and Sexual Activity  . Alcohol use: Yes    Comment: few glasses of wine a week  . Drug use: No  . Sexual activity: Not on file  Lifestyle  . Physical activity:    Days per week: Not on file    Minutes per session: Not on file  . Stress: Not on file  Relationships  . Social connections:    Talks on phone: Not on file    Gets together: Not on file    Attends religious service: Not on file    Active member of club or organization: Not on  file    Attends meetings of clubs or organizations: Not on file    Relationship status: Not on file  . Intimate partner violence:    Fear of current or ex partner: Not on file    Emotionally abused: Not on file    Physically abused: Not on file    Forced sexual activity: Not on file  Other Topics Concern  . Not on file  Social History Narrative  . Not on file    Outpatient Medications Prior to Visit  Medication Sig Dispense Refill  . cephALEXin (KEFLEX) 500 MG capsule TAKE 1 CAPSULE (500 MG TOTAL) BY MOUTH 2 TIMES DAILY FOR 7 DAYS.    Marland Kitchen. diphenoxylate-atropine (LOMOTIL) 2.5-0.025 MG tablet Take 1 tablet by mouth 4 (four) times daily as needed for diarrhea or loose stools. 120 tablet 1  . HYDROcodone-acetaminophen (NORCO/VICODIN) 5-325 MG tablet Take 1 tablet by mouth every 6 (six) hours as needed for moderate pain.    Marland Kitchen. MAGNESIUM PO Take 1  tablet by mouth daily.    . metoCLOPramide (REGLAN) 10 MG tablet Take 1 tablet (10 mg total) by mouth every 6 (six) hours as needed for nausea (or headache). 30 tablet 0  . ondansetron (ZOFRAN) 4 MG tablet Take 1 tablet (4 mg total) by mouth every 8 (eight) hours as needed for nausea or vomiting. 30 tablet 0  . pantoprazole (PROTONIX) 40 MG tablet Take 1 tablet (40 mg total) by mouth daily. 90 tablet 3  . POTASSIUM PO Take 1 tablet by mouth daily.    . simvastatin (ZOCOR) 20 MG tablet Take 1 tablet (20 mg total) by mouth every evening. 90 tablet 3  . SUMAtriptan (IMITREX) 50 MG tablet Take 1 tablet (50 mg total) by mouth every 2 (two) hours as needed for migraine. Max 100 mg in 24 hours 10 tablet 3  . traZODone (DESYREL) 50 MG tablet Take 0.5-1 tablets (25-50 mg total) by mouth at bedtime as needed for sleep. May take up to 2 tablets if needed 180 tablet 3  . medroxyPROGESTERone (DEPO-PROVERA) 150 MG/ML injection Inject 1 mL (150 mg total) into the muscle once. 1 mL 0   Facility-Administered Medications Prior to Visit  Medication Dose Route Frequency Provider Last Rate Last Dose  . medroxyPROGESTERone (DEPO-PROVERA) injection 150 mg  150 mg Intramuscular Once Myles LippsSantiago, Irma M, MD        Allergies  Allergen Reactions  . Nsaids Other (See Comments)    ulcers    ROS Review of Systems See hpi   Objective:    Physical Exam  BP (!) 146/82 (BP Location: Right Arm, Patient Position: Sitting, Cuff Size: Normal)   Pulse (!) 110   Temp 99 F (37.2 C) (Oral)   Resp 17   Ht 5\' 8"  (1.727 m)   Wt 164 lb 9.6 oz (74.7 kg)   SpO2 98%   BMI 25.03 kg/m  Wt Readings from Last 3 Encounters:  09/29/18 164 lb 9.6 oz (74.7 kg)  04/29/18 169 lb (76.7 kg)  03/18/18 167 lb 8.8 oz (76 kg)   General: alert, oriented, in NAD Head: normocephalic, atraumatic, no sinus tenderness Eyes: EOM intact, no scleral icterus or conjunctival injection Ears: TM clear bilaterally Nose: mucosa nonerythematous,  nonedematous Throat: no pharyngeal exudate or erythema Lymph: no posterior auricular, submental or cervical lymph adenopathy Heart: normal rate, normal sinus rhythm, no murmurs Lungs: clear to auscultation bilaterally, no wheezing    There are no preventive care reminders to display for this  patient.  There are no preventive care reminders to display for this patient.  Lab Results  Component Value Date   TSH 1.14 11/07/2015   Lab Results  Component Value Date   WBC 4.0 03/18/2018   HGB 12.4 03/18/2018   HCT 37.9 03/18/2018   MCV 97.9 03/18/2018   PLT 234 03/18/2018   Lab Results  Component Value Date   NA 136 03/18/2018   K 3.5 03/18/2018   CHLORIDE 102 03/02/2013   CO2 21 (L) 03/18/2018   GLUCOSE 171 (H) 03/18/2018   BUN 8 03/18/2018   CREATININE 0.49 03/18/2018   BILITOT 0.4 11/27/2017   ALKPHOS 47 11/27/2017   AST 35 11/27/2017   ALT 29 11/27/2017   PROT 6.8 11/27/2017   ALBUMIN 4.2 11/27/2017   CALCIUM 8.8 (L) 03/18/2018   ANIONGAP 11 03/18/2018   GFR 83.54 11/27/2017   Lab Results  Component Value Date   CHOL 176 11/27/2017   Lab Results  Component Value Date   HDL 68.80 11/27/2017   Lab Results  Component Value Date   LDLCALC 142 (H) 12/11/2014   Lab Results  Component Value Date   TRIG 458.0 (H) 11/27/2017   Lab Results  Component Value Date   CHOLHDL 3 11/27/2017   Lab Results  Component Value Date   HGBA1C 6.3 11/27/2017      Assessment & Plan:   Problem List Items Addressed This Visit    None    Visit Diagnoses    Acute URI    -  Primary   Relevant Medications   cephALEXin (KEFLEX) 500 MG capsule   Flu-like symptoms       Relevant Orders   POC Influenza A&B(BINAX/QUICKVUE) (Completed)     Discussed viral etiology Discussed otc decongestant Advised flonase for congestion  Increase hydration Vitamin C and zinc lozenges suggested Return to clinic if symptoms worse  No orders of the defined types were placed in this  encounter.   Follow-up: No follow-ups on file.    Doristine Bosworth, MD

## 2018-10-01 ENCOUNTER — Encounter: Payer: Self-pay | Admitting: Family Medicine

## 2018-10-01 ENCOUNTER — Telehealth: Payer: Self-pay | Admitting: Family Medicine

## 2018-10-01 NOTE — Telephone Encounter (Signed)
Copied from CRM [id number]. Topic: General - Other >> Oct 01, 2018 11:59 AM Leafy Ro wrote: Reason for CRM: pt saw dr Creta Levin on 09/29/2018 dx with uri . Pt needs work note from 09-29-2018 and return to work on 10-06-2018. Pt still has a temp 99.9 she just took some tylenol. Pt is still fatigue. Please fax to attn niki or annette [phone]

## 2018-10-01 NOTE — Telephone Encounter (Signed)
Called and advised the pt that unfortunately since she was not diagnosed with the Flu at her OV we could not extend her whork note for a week. Advised her that if her symptoms was not improving after tomorrow she should call the office and schedule a follow-up appointment.

## 2018-10-02 ENCOUNTER — Encounter: Payer: Self-pay | Admitting: *Deleted

## 2018-10-14 DIAGNOSIS — H401133 Primary open-angle glaucoma, bilateral, severe stage: Secondary | ICD-10-CM | POA: Diagnosis not present

## 2018-10-16 ENCOUNTER — Telehealth: Payer: Self-pay

## 2018-10-16 ENCOUNTER — Ambulatory Visit: Payer: BLUE CROSS/BLUE SHIELD

## 2018-10-16 NOTE — Telephone Encounter (Signed)
Pt ambulatory to clinic for Depo-Provera injection.  Reviewed chart.  Pt currently out of window.  In addition, she is not a [facility] patient.  No active orders for Depo and no recent CPE with [facility].  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  Advised that we provide Depo via [facility] to [facility] patients only and that we do not have an active order.  Offered to schedule appt to est care here.  Pt will contact her PCP.

## 2018-10-21 NOTE — Progress Notes (Signed)
[facility] at [facility] [address], [address] [place], [place] [address] [phone]  Date:  10/22/2018   Name:  Lisa [name]   DOB:  Jan 10, 1971   MRN:  [id number]  PCP:  Pearline Cables, MD    Chief Complaint: Contraception (depo shot)   History of Present Illness:  Lisa [name] is a 48 y.o. very pleasant female patient who presents with the following:  Lisa [name] is here for routine visit, and also for her Depo-Provera injection History of hyperlipidemia, hypertension, prediabetes She has had a left total hip in 2014 due to avascular necrosis More recently she has suffered from fecal incontinence, and placement of a sacral neuromodulator implant in December This is helping her - she is no longer having to wear diapers, her symptoms are much better she is very pleased. she is also using a fiber supplement to bulk her stool-no to work better  Health maintenance appears to be up-to-date, can offer flu shot-patient declined  Last Depo-Provera shot given on July 11, 2018.  She is about 2 months overdue on her depo shot, went to [place] but they were not able to give to her Last had intercourse a month ago; assuming her HCG is negative we are good for her shot today  She has been with her current partner for about a year   She has been on Depo-Provera for greater than 20 years.  We discussed potential issues with bone density, especially with long-term use of this medication.  Can really does not want to have menstrual periods, and so it does not want to become pregnant this time. We will get a bone density scan for her, and go from there Patient Active Problem List   Diagnosis Date Noted  . Fecal incontinence 01/15/2018  . Pre-diabetes 02/14/2017  . Left anterior cruciate ligament tear 08/03/2016  . Acute medial meniscus tear of left knee 08/03/2016  . Acute lateral meniscus tear of left knee 08/03/2016  . History of abnormal cervical Pap  smear 09/21/2013  . Leukocytopenia, unspecified 03/02/2013  . S/P hip replacement 02/06/2013  . Hyperlipidemia 10/13/2012  . HTN (hypertension) 10/13/2012    Past Medical History:  Diagnosis Date  . Anemia    hx of  . Anxiety 20 years ago  . Depression 20 years ago  . GERD (gastroesophageal reflux disease)   . Heart murmur   . Hyperlipidemia   . Hypertension    history of htn, no meds now  . Ulcer    x2  . Vaginal Pap smear, abnormal     Past Surgical History:  Procedure Laterality Date  . ANTERIOR CRUCIATE LIGAMENT REPAIR Left 09/04/2016   Procedure: LEFT KNEE MEDIAL COLLATERAL LIGAMENT REPAIR, ANTERIOR CRUCIATE LIGAMENT RECONSTRUCTION, MENISCAL DEBRIDEMENT VERSUS REPAIR;  Surgeon: Cammy Copa, MD;  Location: [facility];  Service: Orthopedics;  Laterality: Left;  . ESOPHAGOGASTRODUODENOSCOPY ENDOSCOPY     at least 4  . LAPAROSCOPIC APPENDECTOMY N/A 02/23/2015   Procedure: APPENDECTOMY LAPAROSCOPIC;  Surgeon: Gaynelle Adu, MD;  Location: [facility];  Service: General;  Laterality: N/A;  . TOTAL HIP ARTHROPLASTY Left 02/06/2013   Procedure: LEFT TOTAL HIP ARTHROPLASTY ANTERIOR APPROACH;  Surgeon: Kathryne Hitch, MD;  Location: [facility];  Service: Orthopedics;  Laterality: Left;    Social History   Tobacco Use  . Smoking status: Former Smoker    Packs/day: 0.25    Years: 20.00    Pack years: 5.00  Types: Cigarettes    Last attempt to quit: 08/13/2013    Years since quitting: 5.1  . Smokeless tobacco: Never Used  Substance Use Topics  . Alcohol use: Yes    Comment: few glasses of wine a week  . Drug use: No    Family History  Adopted: Yes    Allergies  Allergen Reactions  . Nsaids Other (See Comments)    ulcers  . Latex     Surgical tape not bandaid     Medication list has been reviewed and updated.  Current Outpatient Medications on File Prior to Visit  Medication Sig Dispense Refill  . cephALEXin (KEFLEX) 500 MG capsule TAKE 1 CAPSULE (500 MG TOTAL)  BY MOUTH 2 TIMES DAILY FOR 7 DAYS.    Marland Kitchen diphenoxylate-atropine (LOMOTIL) 2.5-0.025 MG tablet Take 1 tablet by mouth 4 (four) times daily as needed for diarrhea or loose stools. 120 tablet 1  . HYDROcodone-acetaminophen (NORCO/VICODIN) 5-325 MG tablet Take 1 tablet by mouth every 6 (six) hours as needed for moderate pain.    Marland Kitchen MAGNESIUM PO Take 1 tablet by mouth daily.    . medroxyPROGESTERone (DEPO-PROVERA) 150 MG/ML injection Inject 1 mL (150 mg total) into the muscle once. 1 mL 0  . metoCLOPramide (REGLAN) 10 MG tablet Take 1 tablet (10 mg total) by mouth every 6 (six) hours as needed for nausea (or headache). 30 tablet 0  . ondansetron (ZOFRAN) 4 MG tablet Take 1 tablet (4 mg total) by mouth every 8 (eight) hours as needed for nausea or vomiting. 30 tablet 0  . pantoprazole (PROTONIX) 40 MG tablet Take 1 tablet (40 mg total) by mouth daily. 90 tablet 3  . POTASSIUM PO Take 1 tablet by mouth daily.    . simvastatin (ZOCOR) 20 MG tablet Take 1 tablet (20 mg total) by mouth every evening. 90 tablet 3  . SUMAtriptan (IMITREX) 50 MG tablet Take 1 tablet (50 mg total) by mouth every 2 (two) hours as needed for migraine. Max 100 mg in 24 hours 10 tablet 3  . traZODone (DESYREL) 50 MG tablet Take 0.5-1 tablets (25-50 mg total) by mouth at bedtime as needed for sleep. May take up to 2 tablets if needed 180 tablet 3   Current Facility-Administered Medications on File Prior to Visit  Medication Dose Route Frequency Provider Last Rate Last Dose  . medroxyPROGESTERone (DEPO-PROVERA) injection 150 mg  150 mg Intramuscular Once Myles Lipps, MD        Review of Systems:  As per HPI- otherwise negative. No fever or chills, no chest pain or shortness of breath  Physical Examination: Vitals:   10/22/18 1621  BP: 126/88  Pulse: (!) 107  Resp: 16  Temp: 98 F (36.7 C)  SpO2: 98%   Vitals:   10/22/18 1621  Weight: 167 lb (75.8 kg)  Height: 5\' 8"  (1.727 m)   Body mass index is 25.39  kg/m. Ideal Body Weight: Weight in (lb) to have BMI = 25: 164.1  GEN: WDWN, NAD, Non-toxic, A & O x 3, minimal overweight, looks well HEENT: Atraumatic, Normocephalic. Neck supple. No masses, No LAD. Ears and Nose: No external deformity. CV: RRR, No M/G/R. No JVD. No thrill. No extra heart sounds. PULM: CTA B, no wheezes, crackles, rhonchi. No retractions. No resp. distress. No accessory muscle use. Stimulator placed in left sacral region EXTR: No c/c/e NEURO Normal gait.  PSYCH: Normally interactive. Conversant. Not depressed or anxious appearing.  Calm demeanor.   Results for  orders placed or performed in visit on 10/22/18  POCT urine pregnancy  Result Value Ref Range   Preg Test, Ur Negative Negative    Assessment and Plan: Encounter for surveillance of injectable contraceptive - Plan: POCT urine pregnancy, medroxyPROGESTERone (DEPO-PROVERA) injection 150 mg  Medication monitoring encounter - Plan: DG Bone Density  Screening for deficiency anemia - Plan: CBC  Screening for diabetes mellitus - Plan: Comprehensive metabolic panel  Pre-diabetes - Plan: Hemoglobin A1c  Screening for hyperlipidemia - Plan: Lipid panel    Lisa [name] has been on depo-provera for 20+ years.  She has not wanted to really consider other methods in the past but is now more open perhaps an IUD, we discussed bone density concerns  We will do a bone density test for her.  However review of UTD is more positive about using depo for long term use:  Review of data-We, and others, including the [employer] (ACOG), the Centers for Disease Control and Prevention (CDC), the Society for Adolescent Health and Medicine [facility](SAHM), the World Health Organization (WHO), and the Society of Obstetricians and Gynaecologists of [place] (SOGC) [09,81-19][37,65-71], believe that the advantages of DMPA use as a contraceptive generally outweigh the theoretical concerns regarding skeletal harm. Accordingly,  skeletal health concerns should not restrict initiation or continuation of DMPA in adolescent girls, women 5518 to 46 years of age, or older reproductive age women (age more than 45 years). The available evidence also does not justify limiting the duration of DMPA therapy, which may be continued for decades.  I have shared the statement with patient, would like to go ahead with a bone density for her however Ordered routine labs for her, she will have done at a later date as her lab is currently closed Received her Depo-Provera today-urine pregnancy test is negative and she states her last intercourse was 1 month or longer ago Signed Abbe AmsterdamJessica Syrah Daughtrey, MD

## 2018-10-22 ENCOUNTER — Encounter: Payer: Self-pay | Admitting: Family Medicine

## 2018-10-22 ENCOUNTER — Ambulatory Visit: Payer: BLUE CROSS/BLUE SHIELD | Admitting: Family Medicine

## 2018-10-22 ENCOUNTER — Other Ambulatory Visit: Payer: Self-pay

## 2018-10-22 VITALS — BP 126/88 | HR 107 | Temp 98.0°F | Resp 16 | Ht 68.0 in | Wt 167.0 lb

## 2018-10-22 DIAGNOSIS — Z3042 Encounter for surveillance of injectable contraceptive: Secondary | ICD-10-CM | POA: Diagnosis not present

## 2018-10-22 DIAGNOSIS — R7303 Prediabetes: Secondary | ICD-10-CM

## 2018-10-22 DIAGNOSIS — Z5181 Encounter for therapeutic drug level monitoring: Secondary | ICD-10-CM

## 2018-10-22 DIAGNOSIS — Z13220 Encounter for screening for lipoid disorders: Secondary | ICD-10-CM

## 2018-10-22 DIAGNOSIS — Z130 Encounter for screening for diseases of the blood and blood-forming organs and certain disorders involving the immune mechanism: Secondary | ICD-10-CM

## 2018-10-22 DIAGNOSIS — Z131 Encounter for screening for diabetes mellitus: Secondary | ICD-10-CM

## 2018-10-22 LAB — POCT URINE PREGNANCY: Preg Test, Ur: NEGATIVE

## 2018-10-22 MED ORDER — MEDROXYPROGESTERONE ACETATE 150 MG/ML IM SUSP: 150.0000 mg | Freq: Once | INTRAMUSCULAR | Status: DC

## 2018-10-22 MED ORDER — MEDROXYPROGESTERONE ACETATE 150 MG/ML IM SUSP
150.0000 mg | Freq: Once | INTRAMUSCULAR | Status: AC
  Administered 2018-10-22: 150 mg via INTRAMUSCULAR

## 2018-10-22 NOTE — Patient Instructions (Addendum)
We will set you up for a bone density scan- if you like, stop by the imaging dept on the ground floor and schedule today If you are losing bone density, we should consider a different type of contraceptive Now that you are not smoking, we could use a regular birth control pill for you and just have you have a period 4x a year perhaps  I ordered routine labs for you- please schedule a lab visit for yourself and your convenience

## 2018-12-15 ENCOUNTER — Ambulatory Visit (INDEPENDENT_AMBULATORY_CARE_PROVIDER_SITE_OTHER): Payer: BLUE CROSS/BLUE SHIELD | Admitting: Family Medicine

## 2018-12-15 ENCOUNTER — Encounter: Payer: Self-pay | Admitting: Family Medicine

## 2018-12-15 ENCOUNTER — Other Ambulatory Visit: Payer: Self-pay

## 2018-12-15 DIAGNOSIS — R197 Diarrhea, unspecified: Secondary | ICD-10-CM | POA: Diagnosis not present

## 2018-12-15 DIAGNOSIS — R300 Dysuria: Secondary | ICD-10-CM

## 2018-12-15 MED ORDER — DIPHENOXYLATE-ATROPINE 2.5-0.025 MG PO TABS: 1.0000 | ORAL_TABLET | Freq: Four times a day (QID) | ORAL | 1 refills | Status: DC | PRN

## 2018-12-15 MED ORDER — NITROFURANTOIN MONOHYD MACRO 100 MG PO CAPS: 100.0000 mg | ORAL_CAPSULE | Freq: Two times a day (BID) | ORAL | 0 refills | Status: DC

## 2018-12-15 NOTE — Progress Notes (Signed)
[facility] at [facility] [address], [address] [place], [place] [address] [phone]  Date:  12/15/2018   Name:  Lisa [name]   DOB:  14-Jan-1971   MRN:  [id number]  PCP:  Pearline Cables, MD    Chief Complaint: No chief complaint on file.   History of Present Illness:  Lisa [name] is a 48 y.o. very pleasant female patient who presents with the following:  Patient with history of hypertension, hyperlipidemia, prediabetes History of left total hip replacement in 2014 due to avascular necrosis She also was suffering from fecal incontinence, had a sacral neuromodulator implant placed in December 2019.  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 Fecal incontinence 01/15/2018  . Pre-diabetes 02/14/2017  . Left anterior cruciate ligament tear 08/03/2016  . Acute medial meniscus tear of left knee 08/03/2016  . Acute lateral meniscus tear of left knee 08/03/2016  . History of abnormal cervical Pap smear 09/21/2013  . Leukocytopenia, unspecified 03/02/2013  . S/P hip replacement 02/06/2013  . Hyperlipidemia 10/13/2012  . HTN (hypertension) 10/13/2012    Past Medical History:  Diagnosis Date  . Anemia    hx of  . Anxiety 20 years ago  . Depression 20 years ago  . GERD (gastroesophageal reflux  disease)   . Heart murmur   . Hyperlipidemia   . Hypertension    history of htn, no meds now  . Ulcer    x2  . Vaginal Pap smear, abnormal     Past Surgical History:  Procedure Laterality Date  . ANTERIOR CRUCIATE LIGAMENT REPAIR Left 09/04/2016   Procedure: LEFT KNEE MEDIAL COLLATERAL LIGAMENT REPAIR, ANTERIOR CRUCIATE LIGAMENT RECONSTRUCTION, MENISCAL DEBRIDEMENT VERSUS REPAIR;  Surgeon: Cammy Copa, MD;  Location: [facility];  Service: Orthopedics;  Laterality: Left;  . ESOPHAGOGASTRODUODENOSCOPY ENDOSCOPY     at least 4  . LAPAROSCOPIC APPENDECTOMY N/A 02/23/2015   Procedure: APPENDECTOMY LAPAROSCOPIC;  Surgeon: Gaynelle Adu, MD;  Location: [facility];  Service: General;  Laterality: N/A;  . TOTAL HIP ARTHROPLASTY Left 02/06/2013   Procedure: LEFT TOTAL HIP ARTHROPLASTY ANTERIOR APPROACH;  Surgeon: Kathryne Hitch, MD;  Location: [facility];  Service: Orthopedics;  Laterality: Left;    Social History   Tobacco Use  . Smoking status: Former Smoker    Packs/day: 0.25    Years: 20.00    Pack years: 5.00    Types: Cigarettes    Last attempt to quit: 08/13/2013    Years since quitting: 5.3  . Smokeless tobacco: Never Used  Substance Use Topics  . Alcohol use: Yes    Comment: few glasses of wine a week  . Drug use: No    Family History  Adopted: Yes    Allergies  Allergen  Reactions  . Nsaids Other (See Comments)    ulcers  . Latex     Surgical tape not bandaid     Medication list has been reviewed and updated.  Current Outpatient Medications on File Prior to Visit  Medication Sig Dispense Refill  . cephALEXin (KEFLEX) 500 MG capsule TAKE 1 CAPSULE (500 MG TOTAL) BY MOUTH 2 TIMES DAILY FOR 7 DAYS.    Marland Kitchen. diphenoxylate-atropine (LOMOTIL) 2.5-0.025 MG tablet Take 1 tablet by mouth 4 (four) times daily as needed for diarrhea or loose stools. 120 tablet 1  . HYDROcodone-acetaminophen (NORCO/VICODIN) 5-325 MG tablet Take 1 tablet by mouth every 6 (six) hours as needed for  moderate pain.    Marland Kitchen. MAGNESIUM PO Take 1 tablet by mouth daily.    . medroxyPROGESTERone (DEPO-PROVERA) 150 MG/ML injection Inject 1 mL (150 mg total) into the muscle once. 1 mL 0  . metoCLOPramide (REGLAN) 10 MG tablet Take 1 tablet (10 mg total) by mouth every 6 (six) hours as needed for nausea (or headache). 30 tablet 0  . ondansetron (ZOFRAN) 4 MG tablet Take 1 tablet (4 mg total) by mouth every 8 (eight) hours as needed for nausea or vomiting. 30 tablet 0  . pantoprazole (PROTONIX) 40 MG tablet Take 1 tablet (40 mg total) by mouth daily. 90 tablet 3  . POTASSIUM PO Take 1 tablet by mouth daily.    . simvastatin (ZOCOR) 20 MG tablet Take 1 tablet (20 mg total) by mouth every evening. 90 tablet 3  . SUMAtriptan (IMITREX) 50 MG tablet Take 1 tablet (50 mg total) by mouth every 2 (two) hours as needed for migraine. Max 100 mg in 24 hours 10 tablet 3  . traZODone (DESYREL) 50 MG tablet Take 0.5-1 tablets (25-50 mg total) by mouth at bedtime as needed for sleep. May take up to 2 tablets if needed 180 tablet 3   Current Facility-Administered Medications on File Prior to Visit  Medication Dose Route Frequency Provider Last Rate Last Dose  . medroxyPROGESTERone (DEPO-PROVERA) injection 150 mg  150 mg Intramuscular Once Myles LippsSantiago, Irma M, MD        Review of Systems:  As per HPI- otherwise negative.   Physical Examination: There were no vitals filed for this visit. There were no vitals filed for this visit. There is no height or weight on file to calculate BMI. Ideal Body Weight:    New patient over video.  She looks well, her normal self No cough, wheezing, tachypnea is noted  Assessment and Plan: Dysuria - Plan: nitrofurantoin, macrocrystal-monohydrate, (MACROBID) 100 MG capsule  Diarrhea, unspecified type - Plan: diphenoxylate-atropine (LOMOTIL) 2.5-0.025 MG tablet, DISCONTINUED: diphenoxylate-atropine (LOMOTIL) 2.5-0.025 MG tablet  Treat for presumed UTI with Macrobid prescription.   Did not obtain urine testing due to pandemic.  I have advised patient that if her symptoms are not better within the next couple of days, we may need to do a urine culture.  She will let me know in this case  She also needs a refill of her chronic Lomotil, provided this refill today  Signed Abbe AmsterdamJessica Copland, MD

## 2019-01-01 ENCOUNTER — Other Ambulatory Visit: Payer: Self-pay

## 2019-01-01 ENCOUNTER — Ambulatory Visit: Payer: BLUE CROSS/BLUE SHIELD | Admitting: Family Medicine

## 2019-01-01 ENCOUNTER — Encounter: Payer: Self-pay | Admitting: Family Medicine

## 2019-01-01 ENCOUNTER — Ambulatory Visit (HOSPITAL_BASED_OUTPATIENT_CLINIC_OR_DEPARTMENT_OTHER)
Admission: RE | Admit: 2019-01-01 | Discharge: 2019-01-01 | Disposition: A | Discharge status: Home / Self Care | Payer: BLUE CROSS/BLUE SHIELD | Source: Ambulatory Visit | Attending: Family Medicine | Admitting: Family Medicine

## 2019-01-01 VITALS — BP 128/90 | HR 80 | Temp 98.4°F | Resp 16 | Ht 68.0 in | Wt 157.0 lb

## 2019-01-01 DIAGNOSIS — M79674 Pain in right toe(s): Secondary | ICD-10-CM

## 2019-01-01 DIAGNOSIS — M7989 Other specified soft tissue disorders: Secondary | ICD-10-CM | POA: Insufficient documentation

## 2019-01-01 DIAGNOSIS — Z3042 Encounter for surveillance of injectable contraceptive: Secondary | ICD-10-CM | POA: Diagnosis not present

## 2019-01-01 MED ORDER — HYDROCODONE-ACETAMINOPHEN 5-325 MG PO TABS: 1.0000 | ORAL_TABLET | Freq: Three times a day (TID) | ORAL | 0 refills | Status: DC | PRN

## 2019-01-01 NOTE — Progress Notes (Signed)
[facility] at [facility] [address] [place], [place] [address] [phone]  Date:  01/01/2019   Name:  Lisa [name]   DOB:  1971-06-05   MRN:  [id number]  PCP:  Pearline Cables, MD    Chief Complaint: Toe Pain (redness, pain worsening, unable to touch it without pain, no known injury )   History of Present Illness:  Lisa [name] is a 48 y.o. very pleasant female patient who presents with the following:  Pt with history of fecal incontinence now with sacral neuromodulator, HTN, hyperlipidemia, pre-diabetes, left hip replacement due to AVN  Long term use of depo-provera.  She thought that she was in shot when it today, but she is actually early.  We will schedule a nurse visit for another day  Visit in person today due to an issue with her right second toe She does not recall any injury to her toes, but has noted apparent bruising and pain of her left 2nd toe for about one month She first noticed that wearing closed toed shoes uncomfortable, It has gradually gotten worse It does not seem to be getting better She notes it is been slightly swollen. She can really only tolerate an open toed shoe. Even socks are painful to her  No other unusual joint findings, she is otherwise feeling well  She has lost some weight which she is pleased about  She cannot take nsaids due to history of ulcer  No cough, fever, chills   Wt Readings from Last 3 Encounters:  01/01/19 157 lb (71.2 kg)  10/22/18 167 lb (75.8 kg)  09/29/18 164 lb 9.6 oz (74.7 kg)     Patient Active Problem List   Diagnosis Date Noted  . Fecal incontinence 01/15/2018  . Pre-diabetes 02/14/2017  . Left anterior cruciate ligament tear 08/03/2016  . Acute medial meniscus tear of left knee 08/03/2016  . Acute lateral meniscus tear of left knee 08/03/2016  . History of abnormal cervical Pap smear 09/21/2013  . Leukocytopenia, unspecified 03/02/2013  . S/P hip  replacement 02/06/2013  . Hyperlipidemia 10/13/2012  . HTN (hypertension) 10/13/2012    Past Medical History:  Diagnosis Date  . Anemia    hx of  . Anxiety 20 years ago  . Depression 20 years ago  . GERD (gastroesophageal reflux disease)   . Heart murmur   . Hyperlipidemia   . Hypertension    history of htn, no meds now  . Ulcer    x2  . Vaginal Pap smear, abnormal     Past Surgical History:  Procedure Laterality Date  . ANTERIOR CRUCIATE LIGAMENT REPAIR Left 09/04/2016   Procedure: LEFT KNEE MEDIAL COLLATERAL LIGAMENT REPAIR, ANTERIOR CRUCIATE LIGAMENT RECONSTRUCTION, MENISCAL DEBRIDEMENT VERSUS REPAIR;  Surgeon: Cammy Copa, MD;  Location: [facility];  Service: Orthopedics;  Laterality: Left;  . ESOPHAGOGASTRODUODENOSCOPY ENDOSCOPY     at least 4  . LAPAROSCOPIC APPENDECTOMY N/A 02/23/2015   Procedure: APPENDECTOMY LAPAROSCOPIC;  Surgeon: Gaynelle Adu, MD;  Location: [facility];  Service: General;  Laterality: N/A;  . TOTAL HIP ARTHROPLASTY Left 02/06/2013   Procedure: LEFT TOTAL HIP ARTHROPLASTY ANTERIOR APPROACH;  Surgeon: Kathryne Hitch, MD;  Location: [facility];  Service: Orthopedics;  Laterality: Left;    Social History   Tobacco Use  . Smoking status: Former Smoker    Packs/day: 0.25    Years: 20.00    Pack years: 5.00    Types: Cigarettes  Last attempt to quit: 08/13/2013    Years since quitting: 5.3  . Smokeless tobacco: Never Used  Substance Use Topics  . Alcohol use: Yes    Comment: few glasses of wine a week  . Drug use: No    Family History  Adopted: Yes    Allergies  Allergen Reactions  . Nsaids Other (See Comments)    ulcers  . Latex     Surgical tape not bandaid     Medication list has been reviewed and updated.  Current Outpatient Medications on File Prior to Visit  Medication Sig Dispense Refill  . diphenoxylate-atropine (LOMOTIL) 2.5-0.025 MG tablet Take 1 tablet by mouth 4 (four) times daily as needed for diarrhea or loose stools.  360 tablet 1  . MAGNESIUM PO Take 1 tablet by mouth daily.    . metoCLOPramide (REGLAN) 10 MG tablet Take 1 tablet (10 mg total) by mouth every 6 (six) hours as needed for nausea (or headache). 30 tablet 0  . ondansetron (ZOFRAN) 4 MG tablet Take 1 tablet (4 mg total) by mouth every 8 (eight) hours as needed for nausea or vomiting. 30 tablet 0  . pantoprazole (PROTONIX) 40 MG tablet Take 1 tablet (40 mg total) by mouth daily. 90 tablet 3  . POTASSIUM PO Take 1 tablet by mouth daily.    . simvastatin (ZOCOR) 20 MG tablet Take 1 tablet (20 mg total) by mouth every evening. 90 tablet 3  . SUMAtriptan (IMITREX) 50 MG tablet Take 1 tablet (50 mg total) by mouth every 2 (two) hours as needed for migraine. Max 100 mg in 24 hours 10 tablet 3  . Travoprost, BAK Free, (TRAVATAN Z) 0.004 % SOLN ophthalmic solution     . traZODone (DESYREL) 50 MG tablet Take 0.5-1 tablets (25-50 mg total) by mouth at bedtime as needed for sleep. May take up to 2 tablets if needed 180 tablet 3  . medroxyPROGESTERone (DEPO-PROVERA) 150 MG/ML injection Inject 1 mL (150 mg total) into the muscle once. 1 mL 0   Current Facility-Administered Medications on File Prior to Visit  Medication Dose Route Frequency Provider Last Rate Last Dose  . medroxyPROGESTERone (DEPO-PROVERA) injection 150 mg  150 mg Intramuscular Once Myles Lipps, MD        Review of Systems:  As per HPI- otherwise negative.   Physical Examination: Vitals:   01/01/19 1445  BP: 128/90  Pulse: 80  Resp: 16  Temp: 98.4 F (36.9 C)  SpO2: 98%   Vitals:   01/01/19 1445  Weight: 157 lb (71.2 kg)  Height: 5\' 8"  (1.727 m)   Body mass index is 23.87 kg/m. Ideal Body Weight: Weight in (lb) to have BMI = 25: 164.1  GEN: WDWN, NAD, Non-toxic, A & O x 3, looks well, her normal self  HEENT: Atraumatic, Normocephalic. Neck supple. No masses, No LAD. Ears and Nose: No external deformity. CV: RRR, No M/G/R. No JVD. No thrill. No extra heart  sounds. PULM: CTA B, no wheezes, crackles, rhonchi. No retractions. No resp. distress. No accessory muscle use. NEURO Normal gait.  PSYCH: Normally interactive. Conversant. Not depressed or anxious appearing.  Calm demeanor.  Right foot: The second toe appears bruised distal to the IP joint.  All the toes feel warm and well-perfused.    The end of the toe is quite tender to palpation.  I do not appreciate any definite evidence of infection, though the toe is slightly swollen.  It is difficult to test cap refill  of the second toe due to bruising/discoloration The toe is not numb, is quite tender The rest of the foot is quite normal, no tenderness, heat, swelling.  The foot seems neurovascularly intact, with strong pedal pulses.  The calf is soft, nontender, no cords  Assessment and Plan: Pain and swelling of toe of right foot - Plan: DG Toe 2nd Right, HYDROcodone-acetaminophen (NORCO/VICODIN) 5-325 MG tablet, Ambulatory referral to Vascular Surgery  Depo-Provera contraceptive status  Here today with pain and swelling of the right second toe.  She does not recall any injury.  We will obtain an x-ray to assure ensure no occult fracture.  I called discussed with vascular surgery, they feel she may have a distal embolization of the toe, "blue toe syndrome" and recommended follow-up with vascular surgery within 1 week  [name] is bothered by pain, she cannot take NSAIDs due to history of ulcers.  I did prescribe hydrocodone that she can use as needed for pain, advised her this medication can cause sedation and should not be used when driving or combine with any other sedating meds  I also advised her to try warm compresses  Scheduled a nurse visit for depo for a future date   Meds ordered this encounter  Medications  . HYDROcodone-acetaminophen (NORCO/VICODIN) 5-325 MG tablet    Sig: Take 1 tablet by mouth every 8 (eight) hours as needed for up to 5 days.    Dispense:  15 tablet    Refill:  0      Signed Abbe AmsterdamJessica Dalphine Cowie, MD  Received x-ray of toe, as follows Message to pt  Dg Toe 2nd Right  Result Date: 01/01/2019 CLINICAL DATA:  Second toe pain, swelling and bruising. EXAM: RIGHT SECOND TOE COMPARISON:  None. FINDINGS: Soft tissue swelling distally. Second toe is flexed position. This limits evaluation of the distal phalanx. No subluxation dislocation. No joint abnormality. No displaced fracture. IMPRESSION: Soft tissue swelling. No acute or significant osseous finding by plain radiography. Electronically Signed   By: Judie PetitM.  Shick M.D.   On: 01/01/2019 16:03

## 2019-01-01 NOTE — Telephone Encounter (Signed)
Coming in today

## 2019-01-01 NOTE — Patient Instructions (Addendum)
Please go to the ground floor to have an x-ray for your toe.  Assuming no fracture, we will have you see vascular surgery in the next week to make sure no blood clot in the toe.  I will take care of setting this up!   If any change or worsening noted please alert me!   You can use the pain medication as needed, but it will make you drowsy  Do not combine with any other sedating medication

## 2019-01-05 ENCOUNTER — Encounter: Payer: Self-pay | Admitting: Family Medicine

## 2019-01-08 ENCOUNTER — Other Ambulatory Visit: Payer: Self-pay

## 2019-01-08 ENCOUNTER — Ambulatory Visit (INDEPENDENT_AMBULATORY_CARE_PROVIDER_SITE_OTHER): Payer: BLUE CROSS/BLUE SHIELD

## 2019-01-08 ENCOUNTER — Ambulatory Visit: Payer: BLUE CROSS/BLUE SHIELD

## 2019-01-08 DIAGNOSIS — Z3042 Encounter for surveillance of injectable contraceptive: Secondary | ICD-10-CM

## 2019-01-08 MED ORDER — MEDROXYPROGESTERONE ACETATE 150 MG/ML IM SUSP
150.0000 mg | Freq: Once | INTRAMUSCULAR | Status: AC
  Administered 2019-01-08: 150 mg via INTRAMUSCULAR

## 2019-01-08 NOTE — Progress Notes (Addendum)
Pre visit review using our clinic review tool, if applicable. No additional management support is needed unless otherwise documented below in the visit note.  Patient here today for depo provera injection. Patient in window. 1 mL dep given in left upper outer quadrant IM. Patient tolerated well.   Reviewed and agree- Arthor Captain MD

## 2019-01-12 ENCOUNTER — Encounter: Payer: Self-pay | Admitting: Family Medicine

## 2019-01-12 DIAGNOSIS — M7989 Other specified soft tissue disorders: Secondary | ICD-10-CM

## 2019-01-12 DIAGNOSIS — M79674 Pain in right toe(s): Secondary | ICD-10-CM

## 2019-01-12 MED ORDER — HYDROCODONE-ACETAMINOPHEN 5-325 MG PO TABS
1.0000 | ORAL_TABLET | Freq: Three times a day (TID) | ORAL | 0 refills | Status: DC | PRN
Start: 2019-01-12 — End: 2019-01-21

## 2019-01-13 ENCOUNTER — Other Ambulatory Visit: Payer: Self-pay

## 2019-01-13 DIAGNOSIS — M79674 Pain in right toe(s): Secondary | ICD-10-CM

## 2019-01-13 DIAGNOSIS — M7989 Other specified soft tissue disorders: Secondary | ICD-10-CM

## 2019-01-14 ENCOUNTER — Ambulatory Visit (HOSPITAL_COMMUNITY)
Admission: RE | Admit: 2019-01-14 | Discharge: 2019-01-14 | Disposition: A | Discharge status: Home / Self Care | Payer: BC Managed Care – PPO | Source: Ambulatory Visit | Attending: Vascular Surgery | Admitting: Vascular Surgery

## 2019-01-14 ENCOUNTER — Other Ambulatory Visit: Payer: Self-pay

## 2019-01-14 ENCOUNTER — Encounter: Payer: Self-pay | Admitting: Vascular Surgery

## 2019-01-14 ENCOUNTER — Ambulatory Visit: Payer: BLUE CROSS/BLUE SHIELD

## 2019-01-14 ENCOUNTER — Ambulatory Visit (INDEPENDENT_AMBULATORY_CARE_PROVIDER_SITE_OTHER): Payer: BC Managed Care – PPO | Admitting: Vascular Surgery

## 2019-01-14 VITALS — BP 130/98 | HR 100 | Temp 97.9°F | Resp 20 | Ht 68.0 in | Wt 153.6 lb

## 2019-01-14 DIAGNOSIS — I75021 Atheroembolism of right lower extremity: Secondary | ICD-10-CM | POA: Diagnosis not present

## 2019-01-14 DIAGNOSIS — M79674 Pain in right toe(s): Secondary | ICD-10-CM | POA: Insufficient documentation

## 2019-01-14 DIAGNOSIS — M7989 Other specified soft tissue disorders: Secondary | ICD-10-CM | POA: Diagnosis not present

## 2019-01-14 NOTE — Progress Notes (Signed)
REASON FOR CONSULT:    Pain and swelling of the right toe and foot.  The consult is requested by Dr. [name].  ASSESSMENT & PLAN:   RIGHT SECOND BLUE TOE: I think it is very unlikely that the patient has a proximal embolic source.  She had a CT of the abdomen and pelvis in 2016 with IV contrast which showed no evidence of aortoiliac occlusive disease.  She has palpable pedal pulses and a normal noninvasive study including normal toe pressures.  She does not have any symptoms to suggest underlying peripheral vascular disease and quit smoking many years ago.  I have explained that we could consider arteriography to look for an ulcerated plaque that is not flow-limiting however I think the yield of this would be very low.  In addition it would be associated with a small amount of risk.  All things considered I would not recommend arteriography at this point.  If she develops recurrent issues with discoloration of her toes then I think certainly we could reconsider arteriography.  However, based on the information I have now I do not think this is indicated.  I will see her back as needed.  Waverly Ferrari, MD, FACS Beeper [phone] Office: [phone]  HPI:   Lisa [name] is a pleasant 48 y.o. female,   I reviewed the records from the referring office.  The patient was seen on 01/01/2019 with right second toe pain.  This patient developed discoloration of the right second toe in April of this year.  She does not remember any specific injury to the toe.  The discoloration persisted and was also painful.  She underwent a plain x-ray that showed no evidence of fracture.  She has no history of gout.  Given the possibility of atheroembolic disease she was sent for vascular consultation.  According to the record she does have a history of hypertension and hypercholesterolemia.  She does not have any history of diabetes.  She has a remote history of tobacco use.  She denies any history of  claudication, rest pain, or previous nonhealing ulcers.  Past Medical History:  Diagnosis Date  . Anemia    hx of  . Anxiety 20 years ago  . Depression 20 years ago  . GERD (gastroesophageal reflux disease)   . Heart murmur   . Hyperlipidemia   . Hypertension    history of htn, no meds now  . Ulcer    x2  . Vaginal Pap smear, abnormal     Family History  Adopted: Yes    SOCIAL HISTORY: Social History   Socioeconomic History  . Marital status: Single    Spouse name: Not on file  . Number of children: Not on file  . Years of education: Not on file  . Highest education level: Not on file  Occupational History  . Not on file  Social Needs  . Financial resource strain: Not on file  . Food insecurity:    Worry: Not on file    Inability: Not on file  . Transportation needs:    Medical: Not on file    Non-medical: Not on file  Tobacco Use  . Smoking status: Former Smoker    Packs/day: 0.25    Years: 20.00    Pack years: 5.00    Types: Cigarettes    Last attempt to quit: 08/13/2013    Years since quitting: 5.4  . Smokeless tobacco: Never Used  Substance and Sexual Activity  . Alcohol use:  Yes    Comment: few glasses of wine a week  . Drug use: No  . Sexual activity: Not on file  Lifestyle  . Physical activity:    Days per week: Not on file    Minutes per session: Not on file  . Stress: Not on file  Relationships  . Social connections:    Talks on phone: Not on file    Gets together: Not on file    Attends religious service: Not on file    Active member of club or organization: Not on file    Attends meetings of clubs or organizations: Not on file    Relationship status: Not on file  . Intimate partner violence:    Fear of current or ex partner: Not on file    Emotionally abused: Not on file    Physically abused: Not on file    Forced sexual activity: Not on file  Other Topics Concern  . Not on file  Social History Narrative  . Not on file     Allergies  Allergen Reactions  . Nsaids Other (See Comments)    ulcers  . Latex     Surgical tape not bandaid     Current Outpatient Medications  Medication Sig Dispense Refill  . diphenoxylate-atropine (LOMOTIL) 2.5-0.025 MG tablet Take 1 tablet by mouth 4 (four) times daily as needed for diarrhea or loose stools. 360 tablet 1  . HYDROcodone-acetaminophen (NORCO/VICODIN) 5-325 MG tablet Take 1 tablet by mouth every 8 (eight) hours as needed for up to 5 days. 15 tablet 0  . MAGNESIUM PO Take 1 tablet by mouth daily.    . metoCLOPramide (REGLAN) 10 MG tablet Take 1 tablet (10 mg total) by mouth every 6 (six) hours as needed for nausea (or headache). 30 tablet 0  . ondansetron (ZOFRAN) 4 MG tablet Take 1 tablet (4 mg total) by mouth every 8 (eight) hours as needed for nausea or vomiting. 30 tablet 0  . pantoprazole (PROTONIX) 40 MG tablet Take 1 tablet (40 mg total) by mouth daily. 90 tablet 3  . POTASSIUM PO Take 1 tablet by mouth daily.    . simvastatin (ZOCOR) 20 MG tablet Take 1 tablet (20 mg total) by mouth every evening. 90 tablet 3  . SUMAtriptan (IMITREX) 50 MG tablet Take 1 tablet (50 mg total) by mouth every 2 (two) hours as needed for migraine. Max 100 mg in 24 hours 10 tablet 3  . Travoprost, BAK Free, (TRAVATAN Z) 0.004 % SOLN ophthalmic solution     . traZODone (DESYREL) 50 MG tablet Take 0.5-1 tablets (25-50 mg total) by mouth at bedtime as needed for sleep. May take up to 2 tablets if needed 180 tablet 3  . medroxyPROGESTERone (DEPO-PROVERA) 150 MG/ML injection Inject 1 mL (150 mg total) into the muscle once. 1 mL 0   Current Facility-Administered Medications  Medication Dose Route Frequency Provider Last Rate Last Dose  . medroxyPROGESTERone (DEPO-PROVERA) injection 150 mg  150 mg Intramuscular Once [place], Irma M, MD        REVIEW OF SYSTEMS:   denotes positive finding,  denotes negative finding Cardiac  Comments:  Chest pain or chest pressure:    Shortness  of breath upon exertion:    Short of breath when lying flat:    Irregular heart rhythm:        Vascular    Pain in calf, thigh, or hip brought on by ambulation:    Pain in feet at  night that wakes you up from your sleep:     Blood clot in your veins:    Leg swelling:         Pulmonary    Oxygen at home:    Productive cough:     Wheezing:         Neurologic    Sudden weakness in arms or legs:     Sudden numbness in arms or legs:     Sudden onset of difficulty speaking or slurred speech:    Temporary loss of vision in one eye:     Problems with dizziness:         Gastrointestinal    Blood in stool:     Vomited blood:         Genitourinary    Burning when urinating:     Blood in urine:        Psychiatric    Major depression:         Hematologic    Bleeding problems:    Problems with blood clotting too easily:        Skin    Rashes or ulcers:        Constitutional    Fever or chills:     PHYSICAL EXAM:   Vitals:   01/14/19 0831  BP: (!) 130/98  Pulse: 100  Resp: 20  Temp: 97.9 F (36.6 C)  SpO2: 100%  Weight: 153 lb 9.6 oz (69.7 kg)  Height: 5\' 8"  (1.727 m)   GENERAL: The patient is a well-nourished female, in no acute distress. The vital signs are documented above. CARDIAC: There is a regular rate and rhythm.  VASCULAR: I do not detect carotid bruits. She has palpable femoral, popliteal, dorsalis pedis, posterior tibial pulses bilaterally. She has slight discoloration of the right second toe.   PULMONARY: There is good air exchange bilaterally without wheezing or rales. ABDOMEN: Soft and non-tender with normal pitched bowel sounds.  I do not palpate an abdominal aortic aneurysm. MUSCULOSKELETAL: There are no major deformities or cyanosis. NEUROLOGIC: No focal weakness or paresthesias are detected. SKIN: There are no ulcers or rashes noted. PSYCHIATRIC: The patient has a normal affect.  DATA:    ARTERIAL DOPPLER STUDY: Independently interpreted her  arterial Doppler study today.  On the right side, which is the side of concern, there is a triphasic dorsalis pedis and posterior tibial signal.  ABIs 100%.  Toe pressures 141 mmHg.  On the left side, there is a biphasic posterior tibial signal and a triphasic dorsalis pedis signal.  ABI is 100%.  Toe pressure on the left is 133 mmHg.  X-RAY RIGHT FOOT.  THERE IS NO ACUTE SIGNIFICANT OSSEOUS FINDING. CT ABDOMEN PELVIS: I did review a CT of the abdomen and pelvis that was done with contrast in 2016 which did not show significant aortoiliac occlusive disease.  LABS: GFR in August 2019 was greater than 60.

## 2019-01-20 ENCOUNTER — Encounter: Payer: Self-pay | Admitting: Family Medicine

## 2019-01-20 DIAGNOSIS — M7989 Other specified soft tissue disorders: Secondary | ICD-10-CM

## 2019-01-20 DIAGNOSIS — M79674 Pain in right toe(s): Secondary | ICD-10-CM

## 2019-01-21 MED ORDER — HYDROCODONE-ACETAMINOPHEN 5-325 MG PO TABS: 1.0000 | ORAL_TABLET | Freq: Three times a day (TID) | ORAL | 0 refills | Status: DC | PRN

## 2019-01-21 NOTE — Addendum Note (Signed)
Addended by: Lamar Blinks C on: 01/21/2019 10:02 AM   Modules accepted: Orders

## 2019-02-04 ENCOUNTER — Encounter: Payer: Self-pay | Admitting: Family Medicine

## 2019-02-04 DIAGNOSIS — M79674 Pain in right toe(s): Secondary | ICD-10-CM

## 2019-02-04 DIAGNOSIS — M7989 Other specified soft tissue disorders: Secondary | ICD-10-CM

## 2019-02-04 MED ORDER — HYDROCODONE-ACETAMINOPHEN 5-325 MG PO TABS: 1.0000 | ORAL_TABLET | Freq: Three times a day (TID) | ORAL | 0 refills | Status: DC | PRN

## 2019-02-04 NOTE — Telephone Encounter (Signed)
Called vascular and vein for her- let them know that she needs follow-up  01/21/2019  4   01/21/2019  Hydrocodone-Acetamin 5-325 MG  15.00 5 Je Cop   [id number]   [place] ([id number])   0  15.00 MME  Comm Ins   [place]  01/13/2019  4   12/15/2018  Diphenoxylate-Atrop 2.5-0.025  120.00 30 Je Cop   [id number]   [place] ([id number])   1   Comm Ins   [place]  01/12/2019  4   01/12/2019  Hydrocodone-Acetamin 5-325 MG  15.00 5 Je Cop   [id number]   [place] ([id number])   0  15.00 MME  Comm Ins   [place]  01/01/2019  4   01/01/2019  Hydrocodone-Acetamin 5-325 MG  15.00 5 Je Cop   [id number]   [place] ([id number])   0  15.00 MME  Comm Ins   [place]  12/15/2018  4   12/15/2018  Diphenoxylate-Atrop 2.5-0.025  120.00 30 Je Cop   [id number]   [place] ([id number])   0   Comm Ins   [place]   I will refill her hydrocodone

## 2019-02-05 ENCOUNTER — Other Ambulatory Visit: Payer: Self-pay

## 2019-02-05 ENCOUNTER — Encounter: Payer: Self-pay | Admitting: Family

## 2019-02-05 ENCOUNTER — Ambulatory Visit: Payer: BC Managed Care – PPO | Admitting: Family

## 2019-02-05 VITALS — BP 131/95 | HR 93 | Temp 96.9°F | Resp 14 | Ht 68.0 in | Wt 150.0 lb

## 2019-02-05 DIAGNOSIS — M79674 Pain in right toe(s): Secondary | ICD-10-CM

## 2019-02-05 NOTE — Progress Notes (Signed)
[facility] [place]   CC: Continued right 2nd toe pain   History of Present Illness Lisa [name] is a 48 y.o. female who is again referred by Dr. Warner MccreedyJessica Copeland for right 2nd toe pain evaluation.   She was seen by Dr. Edilia Boickson re this on 01-14-19. At that time Dr. Edilia Boickson thought it was very unlikely that the patient had a proximal embolic source.  She had a CT of the abdomen and pelvis in 2016 with IV contrast which showed no evidence of aortoiliac occlusive disease.  She had palpable pedal pulses and a normal noninvasive study including normal toe pressures.  She did not have any symptoms to suggest underlying peripheral vascular disease and quit smoking many years ago. Dr. Edilia Boickson explained that we could consider arteriography to look for an ulcerated plaque that is not flow-limiting however he thought the yield of this would be very low.  In addition it would be associated with a small amount of risk.  All things considered Dr. Edilia Boickson indicated that he would not recommend arteriography at that point.  If she develops recurrent issues with discoloration of her toes then he thought we could reconsider arteriography.  However, based on the information he had then, he did not think this was indicated.  Pt was to return as needed.  She was seen by Dr. Cammy CopaGregory Scott Dean and Dr. Ophelia CharterYates for chronic low back pain.  She states that she has not seen Dr. Ophelia CharterYates or Dr. August Saucerean for about 2 years, and she did not have right 2nd toe pain and surrounding numbness then.  Her right 2nd toe pain started in April 2020, denies any injury. The pain has become progressively worse. She describes the pain as stinging and throbbing; when the pain is severe, she also has stinging and throbbing at her right great toe and 3rd toes. The pain occasionally wakes her at night. She takes hydrocodone only for this pain, has no other pain, denies any back pain for over a year. She states that she takes the  hydrocodone only when needed.   She states that she has an asymptomatic heart murmur.   She states that she takes Depo for birth control.   Diabetic: No Tobacco use: former smoker, quit about 2015, smoked on and off since her 20's  Pt meds include: Statin :Yes Betablocker: No ASA: No, she states that she cannot take due to history of bleeding gastric ulcers Other anticoagulants/antiplatelets: no  Past Medical History:  Diagnosis Date  . Anemia    hx of  . Anxiety 20 years ago  . Depression 20 years ago  . GERD (gastroesophageal reflux disease)   . Heart murmur   . Hyperlipidemia   . Hypertension    history of htn, no meds now  . Ulcer    x2  . Vaginal Pap smear, abnormal     Social History Social History   Tobacco Use  . Smoking status: Former Smoker    Packs/day: 0.25    Years: 20.00    Pack years: 5.00    Types: Cigarettes    Quit date: 08/13/2013    Years since quitting: 5.4  . Smokeless tobacco: Never Used  Substance Use Topics  . Alcohol use: Yes    Comment: few glasses of wine a week  . Drug use: No    Family History Family History  Adopted: Yes    Past Surgical History:  Procedure Laterality Date  . ANTERIOR CRUCIATE LIGAMENT REPAIR  Left 09/04/2016   Procedure: LEFT KNEE MEDIAL COLLATERAL LIGAMENT REPAIR, ANTERIOR CRUCIATE LIGAMENT RECONSTRUCTION, MENISCAL DEBRIDEMENT VERSUS REPAIR;  Surgeon: Meredith Pel, MD;  Location: [facility];  Service: Orthopedics;  Laterality: Left;  . ESOPHAGOGASTRODUODENOSCOPY ENDOSCOPY     at least 4  . LAPAROSCOPIC APPENDECTOMY N/A 02/23/2015   Procedure: APPENDECTOMY LAPAROSCOPIC;  Surgeon: Greer Pickerel, MD;  Location: [facility];  Service: General;  Laterality: N/A;  . TOTAL HIP ARTHROPLASTY Left 02/06/2013   Procedure: LEFT TOTAL HIP ARTHROPLASTY ANTERIOR APPROACH;  Surgeon: Mcarthur Rossetti, MD;  Location: [facility];  Service: Orthopedics;  Laterality: Left;    Allergies  Allergen Reactions  . Nsaids Other (See  Comments)    ulcers  . Latex     Surgical tape not bandaid     Current Outpatient Medications  Medication Sig Dispense Refill  . diphenoxylate-atropine (LOMOTIL) 2.5-0.025 MG tablet Take 1 tablet by mouth 4 (four) times daily as needed for diarrhea or loose stools. 360 tablet 1  . HYDROcodone-acetaminophen (NORCO/VICODIN) 5-325 MG tablet Take 1 tablet by mouth every 8 (eight) hours as needed for up to 5 days. 15 tablet 0  . MAGNESIUM PO Take 1 tablet by mouth daily.    . metoCLOPramide (REGLAN) 10 MG tablet Take 1 tablet (10 mg total) by mouth every 6 (six) hours as needed for nausea (or headache). 30 tablet 0  . ondansetron (ZOFRAN) 4 MG tablet Take 1 tablet (4 mg total) by mouth every 8 (eight) hours as needed for nausea or vomiting. 30 tablet 0  . pantoprazole (PROTONIX) 40 MG tablet Take 1 tablet (40 mg total) by mouth daily. 90 tablet 3  . POTASSIUM PO Take 1 tablet by mouth daily.    . simvastatin (ZOCOR) 20 MG tablet Take 1 tablet (20 mg total) by mouth every evening. 90 tablet 3  . SUMAtriptan (IMITREX) 50 MG tablet Take 1 tablet (50 mg total) by mouth every 2 (two) hours as needed for migraine. Max 100 mg in 24 hours 10 tablet 3  . Travoprost, BAK Free, (TRAVATAN Z) 0.004 % SOLN ophthalmic solution     . traZODone (DESYREL) 50 MG tablet Take 0.5-1 tablets (25-50 mg total) by mouth at bedtime as needed for sleep. May take up to 2 tablets if needed 180 tablet 3  . medroxyPROGESTERone (DEPO-PROVERA) 150 MG/ML injection Inject 1 mL (150 mg total) into the muscle once. 1 mL 0   Current Facility-Administered Medications  Medication Dose Route Frequency Provider Last Rate Last Dose  . medroxyPROGESTERone (DEPO-PROVERA) injection 150 mg  150 mg Intramuscular Once Rutherford Guys, MD        ROS: See HPI for pertinent positives and negatives.   Physical Examination  Vitals:   02/05/19 0843  BP: (!) 131/95  Pulse: 93  Resp: 14  Temp: (!) 96.9 F (36.1 C)  TempSrc: Temporal   SpO2: 99%  Weight: 150 lb (68 kg)  Height: 5\' 8"  (1.727 m)   Body mass index is 22.81 kg/m.  General: A&O x 3, WDWN, female. Gait: normal HENT: No gross abnormalities.  Eyes: PERRLA. Pulmonary: Respirations are non labored, CTAB, good air movement in all fields Cardiac: regular rhythm, no detected murmur.         Carotid Bruits Right Left   Negative Negative   Radial pulses are 1+ right, 2+ left palpable  Adominal aortic pulse is not palpable  VASCULAR EXAM: Extremities without ischemic changes, without Gangrene; without open wounds. Mild dark discoloration at dorsal aspect of right 2nd toe. See photos below                                                                                                                  LE Pulses Right Left       FEMORAL  2+ palpable  1+ palpable        POPLITEAL  1+ palpable   not palpable       POSTERIOR TIBIAL  not palpable   not palpable        DORSALIS PEDIS      ANTERIOR TIBIAL 2+ palpable  2+ palpable    Abdomen: soft, NT, no palpable masses. Skin: no rashes, no cellulitis, no ulcers noted. See Extremities Musculoskeletal: no muscle wasting or atrophy.  Neurologic: A&O X 3; appropriate affect, Sensation is normal except for pain to light touch at right 2nd toe, numb sensation to touch at right 3rd toe; MOTOR FUNCTION:  moving all extremities equally, motor strength 5/5 throughout. Speech is fluent/normal. CN 2-12 intact. Psychiatric: Thought content is normal, mood appropriate for clinical situation.    ASSESSMENT: Lisa PonderKim M Leaver is a 48 y.o. female who returns for worsening right toe pain.   She has 2+ palpable bilateral DP pulses. Right second toe pain does not seem to be due to lack of arterial perfusion, seems to be nerve irritation or other pain syndrome.  She has a nerve stimulator at her lower left spine, that is for bladder stimulation, for voiding.   Will refer to podiatrist, see Plan.    PLAN:  Based on the patient's recent vascular studies and today's examination and HPI, I will refer pt to [facility] for evaluation of worsening pain in right second toe since April of this year, no known injury, and numbness in right 3rd toe.  She did not have this toe pain when she last saw ortho who was treating her for lumbar spine issues. I advised her to seek their advice also, or could wait for evaluation by podiatrist.  She will follow up with us as needed.   I discussed in depth with the patient the nature of atherosclerosis, and emphasized the importance of maximal medical management including strict control of blood pressure, blood glucose, and lipid levels, obtaining regular exercise, and continued cessation of smoking.  The patient is aware that without maximal medical management the underlying atherosclerotic disease process will progress, limiting the benefit of any interventions.  The patient was given information about PAD including signs, symptoms, treatment, what symptoms should prompt the patient to seek immediate medical care, and risk reduction measures to take.  Charisse MarchSuzanne Nickel, RN, MSN, FNP-C [facility] [place] Office Phone: [phone]  Clinic MD: Veda CanningDickson, Fields  02/05/19 8:49 AM

## 2019-02-09 ENCOUNTER — Ambulatory Visit: Payer: BC Managed Care – PPO | Admitting: Podiatry

## 2019-02-09 ENCOUNTER — Encounter: Payer: Self-pay | Admitting: Podiatry

## 2019-02-09 ENCOUNTER — Ambulatory Visit (INDEPENDENT_AMBULATORY_CARE_PROVIDER_SITE_OTHER): Payer: BC Managed Care – PPO

## 2019-02-09 ENCOUNTER — Other Ambulatory Visit: Payer: Self-pay | Admitting: Podiatry

## 2019-02-09 ENCOUNTER — Other Ambulatory Visit: Payer: Self-pay

## 2019-02-09 VITALS — Temp 97.9°F

## 2019-02-09 DIAGNOSIS — G629 Polyneuropathy, unspecified: Secondary | ICD-10-CM | POA: Diagnosis not present

## 2019-02-09 DIAGNOSIS — M79671 Pain in right foot: Secondary | ICD-10-CM | POA: Diagnosis not present

## 2019-02-09 DIAGNOSIS — M109 Gout, unspecified: Secondary | ICD-10-CM

## 2019-02-09 DIAGNOSIS — M2041 Other hammer toe(s) (acquired), right foot: Secondary | ICD-10-CM

## 2019-02-09 MED ORDER — NITROGLYCERIN 0.4 MG/HR TD PT24: 0.4000 mg | MEDICATED_PATCH | Freq: Every day | TRANSDERMAL | 1 refills | Status: DC

## 2019-02-09 MED ORDER — GABAPENTIN 300 MG PO CAPS: 300.0000 mg | ORAL_CAPSULE | Freq: Three times a day (TID) | ORAL | 3 refills | Status: DC

## 2019-02-10 NOTE — Progress Notes (Signed)
Subjective:   Patient ID: Lisa [name], female   DOB: 48 y.o.   MRN: [id number]   HPI Patient presents stating she is developed a lot of discoloration with severe discomfort of her right second digit and also what it feels appears to be some numbness of her right foot.  States is been going on for 3 months and she did go to the vein and vascular center who did not note any form of pathology.  Patient does not smoke and would like to be active   Review of Systems  All other systems reviewed and are negative.       Objective:  Physical Exam Vitals signs and nursing note reviewed.  Constitutional:      Appearance: She is well-developed.  Pulmonary:     Effort: Pulmonary effort is normal.  Musculoskeletal: Normal range of motion.  Skin:    General: Skin is warm.  Neurological:     Mental Status: She is alert.     Neurovascular status was found to be intact with patient second digit right found to be mildly discolored with exquisite discomfort when palpated with an elongated toe and lateral deviation of the distal to phalangeal joint with swelling of the inner phalangeal joint.  The other digits I was not able to note that same type of discomfort but she does have a vague problem and she did describe how she had a neurogenic condition of her: Which required wiring and implantation procedure     Assessment:  Very difficult to describe what may be happening with this possibly being some form of a neurological issue versus a vascular issue versus an arthritic issue     Plan:  H&P x-rays reviewed and I did send for arthritic screening at this time.  I also placed on nitroglycerin patches wider shoes soaks and I placed her on gabapentin for the next few weeks.  Reappoint to reevaluate and we will look and see what the results of her lab work indicate  X-rays indicate there is an elongated second digit right with moderate deformity of the toe itself

## 2019-02-11 DIAGNOSIS — H401112 Primary open-angle glaucoma, right eye, moderate stage: Secondary | ICD-10-CM | POA: Diagnosis not present

## 2019-02-11 DIAGNOSIS — M109 Gout, unspecified: Secondary | ICD-10-CM | POA: Diagnosis not present

## 2019-02-11 DIAGNOSIS — H401121 Primary open-angle glaucoma, left eye, mild stage: Secondary | ICD-10-CM | POA: Diagnosis not present

## 2019-02-12 LAB — SEDIMENTATION RATE: Sed Rate: 14 mm/h (ref 0–20)

## 2019-02-12 LAB — URIC ACID: Uric Acid, Serum: 6.4 mg/dL (ref 2.5–7.0)

## 2019-02-12 LAB — C-REACTIVE PROTEIN: CRP: 0.5 mg/L (ref <8.0)

## 2019-02-12 LAB — RHEUMATOID FACTOR: Rhuematoid fact SerPl-aCnc: <14 IU/mL (ref <14)

## 2019-02-17 ENCOUNTER — Encounter: Payer: Self-pay | Admitting: Family Medicine

## 2019-02-17 DIAGNOSIS — M7989 Other specified soft tissue disorders: Secondary | ICD-10-CM

## 2019-02-17 DIAGNOSIS — M79674 Pain in right toe(s): Secondary | ICD-10-CM

## 2019-02-17 MED ORDER — HYDROCODONE-ACETAMINOPHEN 5-325 MG PO TABS: 1.0000 | ORAL_TABLET | Freq: Three times a day (TID) | ORAL | 0 refills | Status: DC | PRN

## 2019-02-18 ENCOUNTER — Encounter: Payer: Self-pay | Admitting: Podiatry

## 2019-02-23 ENCOUNTER — Ambulatory Visit: Payer: BC Managed Care – PPO | Admitting: Podiatry

## 2019-02-23 ENCOUNTER — Other Ambulatory Visit: Payer: Self-pay

## 2019-02-23 ENCOUNTER — Encounter: Payer: Self-pay | Admitting: Podiatry

## 2019-02-23 VITALS — Temp 97.9°F

## 2019-02-23 DIAGNOSIS — G629 Polyneuropathy, unspecified: Secondary | ICD-10-CM

## 2019-02-23 DIAGNOSIS — M2041 Other hammer toe(s) (acquired), right foot: Secondary | ICD-10-CM

## 2019-02-23 DIAGNOSIS — M79671 Pain in right foot: Secondary | ICD-10-CM | POA: Diagnosis not present

## 2019-02-26 NOTE — Progress Notes (Signed)
Subjective:   Patient ID: Lisa [name], female   DOB: 48 y.o.   MRN: [id number]   HPI Patient states she has a rash on the second toe and overall it seems to be getting better and her toe still hurts but it seems to be slightly improved from previous and it seems like the gabapentin is helping   ROS      Objective:  Physical Exam  Neurovascular status intact with continued discomfort second digit right but it seems to be improved and not as tender as it was at last visit     Assessment:  Unknown inflammatory or possible neurological condition right that appears to be gradually improving     Plan:  Discussed gradual increase in gabapentin usage she will stop the nitroglycerin patches and utilize a Voltaren cream and will be seen back to recheck again in 6 weeks or earlier if needed

## 2019-03-02 ENCOUNTER — Ambulatory Visit: Payer: BC Managed Care – PPO | Admitting: Podiatry

## 2019-03-03 ENCOUNTER — Encounter: Payer: Self-pay | Admitting: Family Medicine

## 2019-03-03 ENCOUNTER — Other Ambulatory Visit: Payer: Self-pay | Admitting: Podiatry

## 2019-03-03 DIAGNOSIS — M7989 Other specified soft tissue disorders: Secondary | ICD-10-CM

## 2019-03-03 DIAGNOSIS — M79674 Pain in right toe(s): Secondary | ICD-10-CM

## 2019-03-03 MED ORDER — HYDROCODONE-ACETAMINOPHEN 5-325 MG PO TABS: 1.0000 | ORAL_TABLET | Freq: Three times a day (TID) | ORAL | 0 refills | Status: AC | PRN

## 2019-03-12 ENCOUNTER — Encounter: Payer: Self-pay | Admitting: Podiatry

## 2019-03-13 ENCOUNTER — Other Ambulatory Visit: Payer: Self-pay | Admitting: Podiatry

## 2019-03-13 MED ORDER — TRAMADOL HCL 50 MG PO TABS: 50.0000 mg | ORAL_TABLET | Freq: Three times a day (TID) | ORAL | 2 refills | Status: DC

## 2019-03-16 ENCOUNTER — Telehealth: Payer: Self-pay | Admitting: *Deleted

## 2019-03-16 NOTE — Telephone Encounter (Signed)
Left message with tramadol orders that were printed 03/13/2019 on [facility] pharmacy voicemail.

## 2019-03-22 ENCOUNTER — Other Ambulatory Visit: Payer: Self-pay | Admitting: Family Medicine

## 2019-03-23 ENCOUNTER — Ambulatory Visit (INDEPENDENT_AMBULATORY_CARE_PROVIDER_SITE_OTHER): Payer: BC Managed Care – PPO

## 2019-03-23 ENCOUNTER — Ambulatory Visit: Payer: BC Managed Care – PPO | Admitting: Podiatry

## 2019-03-23 ENCOUNTER — Other Ambulatory Visit: Payer: Self-pay | Admitting: Podiatry

## 2019-03-23 ENCOUNTER — Encounter: Payer: Self-pay | Admitting: Podiatry

## 2019-03-23 ENCOUNTER — Other Ambulatory Visit: Payer: Self-pay

## 2019-03-23 VITALS — Temp 98.2°F

## 2019-03-23 DIAGNOSIS — G5792 Unspecified mononeuropathy of left lower limb: Secondary | ICD-10-CM

## 2019-03-23 DIAGNOSIS — G629 Polyneuropathy, unspecified: Secondary | ICD-10-CM

## 2019-03-23 DIAGNOSIS — G5791 Unspecified mononeuropathy of right lower limb: Secondary | ICD-10-CM

## 2019-03-23 DIAGNOSIS — M79671 Pain in right foot: Secondary | ICD-10-CM | POA: Diagnosis not present

## 2019-03-23 DIAGNOSIS — M79672 Pain in left foot: Secondary | ICD-10-CM

## 2019-03-24 ENCOUNTER — Telehealth: Payer: Self-pay | Admitting: *Deleted

## 2019-03-24 DIAGNOSIS — G629 Polyneuropathy, unspecified: Secondary | ICD-10-CM

## 2019-03-24 DIAGNOSIS — M79671 Pain in right foot: Secondary | ICD-10-CM

## 2019-03-24 NOTE — Telephone Encounter (Signed)
Prepared required form, clinicals and demographics to be faxed to [facility] once 03/23/2019 clinicals are available.

## 2019-03-24 NOTE — Progress Notes (Signed)
Subjective:   Patient ID: Lisa [name], female   DOB: 48 y.o.   MRN: [id number]   HPI Patient states she is still having a lot of pain in her right foot and it seems like the third and fourth digits are now also part of the problem.  States that they are very sensitive and makes it very hard for her to wear shoe gear and she had been checked by vascular with no indications of pathology.  Patient states the tramadol and gabapentin seems to help some but she is taking 1200 mg of the gabapentin   ROS      Objective:  Physical Exam  No change neurovascular status with patient found to have significant discoloration distal second third and fourth digits right with also what appears to be distal inner phalangeal joint contracture but no keratotic lesions or no other indications of biomechanical fault     Assessment:  Very difficult to say what may be causing her pathology but so far all blood work has been normal and vascular normal.  There is discoloration of the digits and there is structural malalignment which seems to be intensified     Plan:  I believe most likely we are dealing with some kind of chronic pain syndrome here or other inflammatory disease that were not able to identify.  I did review x-rays today and at this point I am sending to [facility] for probable pain management of possible pain syndrome  X-rays indicate that there is digital contracture of what appears to be distal spotty osteoporosis of the distal phalanx digits 234 right

## 2019-03-25 ENCOUNTER — Encounter: Payer: Self-pay | Admitting: Podiatry

## 2019-03-25 NOTE — Telephone Encounter (Signed)
Faxed prepared information and clinicals to [facility].

## 2019-03-26 NOTE — Telephone Encounter (Signed)
[place] - [facility] states she needs clarification of referral.

## 2019-03-26 NOTE — Telephone Encounter (Signed)
Left message for [place] - [facility] states the referral showed up in PT. I asked [place] to hold the line and let me resend to her. [place] stated she received the referral in Pain Management this time.

## 2019-03-26 NOTE — Addendum Note (Signed)
Addended by: Harriett Sine D on: 03/26/2019 05:03 PM   Modules accepted: Orders

## 2019-03-31 ENCOUNTER — Encounter: Payer: Self-pay | Admitting: Podiatry

## 2019-03-31 ENCOUNTER — Encounter: Payer: Self-pay | Admitting: Family Medicine

## 2019-04-01 ENCOUNTER — Ambulatory Visit: Payer: BC Managed Care – PPO | Attending: Podiatry | Admitting: Physical Therapy

## 2019-04-01 ENCOUNTER — Other Ambulatory Visit: Payer: Self-pay | Admitting: Podiatry

## 2019-04-01 ENCOUNTER — Other Ambulatory Visit: Payer: Self-pay

## 2019-04-01 ENCOUNTER — Encounter: Payer: Self-pay | Admitting: Family Medicine

## 2019-04-01 DIAGNOSIS — M25571 Pain in right ankle and joints of right foot: Secondary | ICD-10-CM | POA: Diagnosis not present

## 2019-04-01 DIAGNOSIS — M7989 Other specified soft tissue disorders: Secondary | ICD-10-CM

## 2019-04-01 DIAGNOSIS — R2689 Other abnormalities of gait and mobility: Secondary | ICD-10-CM

## 2019-04-01 DIAGNOSIS — M79674 Pain in right toe(s): Secondary | ICD-10-CM

## 2019-04-01 MED ORDER — GABAPENTIN 300 MG PO CAPS: 300.0000 mg | ORAL_CAPSULE | Freq: Three times a day (TID) | ORAL | 3 refills | Status: DC

## 2019-04-02 NOTE — Therapy (Signed)
[facility] [address] [place], [place], [address] Phone: [phone]   Fax:  [phone]  Physical Therapy Evaluation  Patient Details  Name: Lisa [name] MRN: [id number] Date of Birth: 06/13/1971 Referring Provider (PT): Cristie HemNorman Regal   Encounter Date: 04/01/2019  PT End of Session - 04/02/19 1236    Visit Number  1    Number of Visits  12    Date for PT Re-Evaluation  05/14/19    Authorization Type  BCBS    PT Start Time  1532    PT Stop Time  1615    PT Time Calculation (min)  43 min    Activity Tolerance  Patient limited by pain    Behavior During Therapy  [facility] for tasks assessed/performed       Past Medical History:  Diagnosis Date  . Anemia    hx of  . Anxiety 20 years ago  . Depression 20 years ago  . GERD (gastroesophageal reflux disease)   . Heart murmur   . Hyperlipidemia   . Hypertension    history of htn, no meds now  . Ulcer    x2  . Vaginal Pap smear, abnormal     Past Surgical History:  Procedure Laterality Date  . ANTERIOR CRUCIATE LIGAMENT REPAIR Left 09/04/2016   Procedure: LEFT KNEE MEDIAL COLLATERAL LIGAMENT REPAIR, ANTERIOR CRUCIATE LIGAMENT RECONSTRUCTION, MENISCAL DEBRIDEMENT VERSUS REPAIR;  Surgeon: Cammy CopaScott Gregory Dean, MD;  Location: [facility];  Service: Orthopedics;  Laterality: Left;  . ESOPHAGOGASTRODUODENOSCOPY ENDOSCOPY     at least 4  . LAPAROSCOPIC APPENDECTOMY N/A 02/23/2015   Procedure: APPENDECTOMY LAPAROSCOPIC;  Surgeon: Gaynelle AduEric Wilson, MD;  Location: [facility];  Service: General;  Laterality: N/A;  . TOTAL HIP ARTHROPLASTY Left 02/06/2013   Procedure: LEFT TOTAL HIP ARTHROPLASTY ANTERIOR APPROACH;  Surgeon: Kathryne Hitchhristopher Y Blackman, MD;  Location: [facility];  Service: Orthopedics;  Laterality: Left;    There were no vitals filed for this visit.   Subjective Assessment - 04/01/19 1531    Subjective  Pt states start of pain in April. No injury to report, no change in activity or footwear.  SHas seen PCP and podiatry. She has had multiple x-rays, as well as (-) testing for gout, infection, vasular, etc. She is also going to Neuro in near future. Pt unsure of what is causing pain. She states severe pain in 2nd toe on R foot. Most pain at end/tip of toe, and between toes 1-2. She has been unable to wear closed toe shoe due to pain. She sits for work, has been able to wear sandals.    Limitations  Standing;Walking;House hold activities    Patient Stated Goals  decreased pain, improved walking    Currently in Pain?  Yes    Pain Score  8     Pain Location  Toe (Comment which one)    Pain Orientation  Right    Pain Descriptors / Indicators  Burning;Sharp;Stabbing    Pain Type  Acute pain    Pain Onset  More than a month ago    Pain Frequency  Constant    Aggravating Factors   light touch, weight bearing, walking, shoes    Pain Relieving Factors  rest, elevation, better in AM         [facility] - 04/02/19 0001      Assessment   Medical Diagnosis  R toe pain    Referring Provider (PT)  Cristie HemNorman Regal    Onset  Date/Surgical Date  11/12/18    Prior Therapy  no      Balance Screen   Has the patient fallen in the past 6 months  No      Prior Function   Level of Independence  Independent      Cognition   Overall Cognitive Status  Within Functional Limits for tasks assessed      Sensation   Light Touch  --   hypersensitive     Posture/Postural Control   Posture Comments  Standing, pt holding R toes in extension       ROM / Strength   AROM / PROM / Strength  AROM;Strength      AROM   Overall AROM Comments  R ankle: WNL,  R toes, mod limitation for flex/ext.       Strength   Overall Strength Comments  R toes: 3-/5, Ankle: 4/5       Palpation   Palpation comment  Severe hypersensitivity to R 2nd toe, mostly at distal end and between first and 2nd toe.  Mild pain in MTP      Ambulation/Gait   Gait Comments  inability for full weight bearing on R foot,  inability for heel to toe propulsion or push off, R toes in extension.                Objective measurements completed on examination: See above findings.      [place] [facility] PT Treatment/Exercise - 04/02/19 0001      Self-Care   Self-Care  Other Self-Care Comments    Other Self-Care Comments   Eduction on Desensitization for R toe, education on footwear       Exercises   Exercises  Ankle      Manual Therapy   Manual Therapy  Soft tissue mobilization    Soft tissue mobilization  STM(light) for R foot and ankle, for general desensitization (R toe too painful to perform on )       Ankle Exercises: Standing   Other Standing Ankle Exercises  L/R weight shifts with UE support, with attempts to have R toes lay flat on floor       Ankle Exercises: Supine   Other Supine Ankle Exercises  AROM for R ankle , all motions x10;  Circles (for HEP)     Other Supine Ankle Exercises  AROM for toe flex/ext x10 on R;              PT Education - 04/02/19 1235    Education Details  PT POC, Exam findings, HEP    Person(s) Educated  Patient    Methods  Explanation;Demonstration;Verbal cues;Handout    Comprehension  Verbalized understanding;Returned demonstration;Need further instruction       PT Short Term Goals - 04/02/19 1404      PT SHORT TERM GOAL #1   Title  Pt to report decreased pain inR  toe to 5/10    Time  2    Period  Weeks    Status  New    Target Date  04/15/19      PT SHORT TERM GOAL #2   Title  Pt to be independent with desensitization for HEP    Time  2    Period  Weeks    Status  New    Target Date  04/15/19        PT Long Term Goals - 04/02/19 1405      PT LONG TERM GOAL #1  Title  Pt to be independent with final HEP    Time  6    Period  Weeks    Status  New    Target Date  05/13/19      PT LONG TERM GOAL #2   Title  Pt to report decreased pain in R toe and foot to 0-2/10 with activity    Time  6    Period  Weeks    Status  New    Target  Date  05/13/19      PT LONG TERM GOAL #3   Title  Pt to demo ability for gait mechanics to be [facility], to improve ability for community activity    Time  6    Period  Weeks    Status  New    Target Date  05/13/19      PT LONG TERM GOAL #4   Title  Pt to have decreased sensitivity , with ability for light touch of R foot/toe, with pain 0-2/10 , to allow pt to wear sock and shoe.    Time  6    Period  Weeks    Status  New    Target Date  05/13/19             Plan - 04/02/19 1239    Clinical Impression Statement  Pt presents with severe pain in R 2nd toe. She is very hypersensitive, and is very reluctant for light touch to this area. There are some skin discoloration to toe. Pt with decreased ability for AROM of toes, and poor strength due to pain. She has very poor ability for weight bearing through R forefoot and toes, with limited ability to let R foot rest on floor with even static standing,  due to pain. She was able to tolerate light STM to rest of R foot/ankle today, done for desensitization. Self desensitization for toe discussed for HEP. Discussed better options for footwear other than flip flop, and discussed using normal/neutral gait mechancis as much as possible. Pt currently with inability for full weight bearing on R foot and inability for push off at this time. Cause of pain is unclear at this time, but pt has had several negative tests for other pathology. Symptoms present most like CRPS but will continue to monitor for source of pain. Pt does have follow up with MD, discussed option of surgical shoe in near future if pain does not improve. She also reports implanted nerve stimulator in her back in last year, due to loss of bowel/bladder, states that she has not had any other complications with this. Pt with significant pain and decrease in ability for functional activity at this time. She will benefit from skilled PT to improve deficits and pain.    Personal Factors and  Comorbidities  Behavior Pattern    Examination-Activity Limitations  Locomotion Level;Transfers;Sleep;Squat;Stairs;Stand;Dressing    Examination-Participation Restrictions  Cleaning;Community Activity;Driving    Stability/Clinical Decision Making  Stable/Uncomplicated    Clinical Decision Making  Low    Rehab Potential  Fair    PT Frequency  2x / week    PT Duration  6 weeks    PT Treatment/Interventions  ADLs/Self Care Home Management;Cryotherapy;Electrical Stimulation;DME Instruction;Ultrasound;Moist Heat;Iontophoresis 4mg /ml Dexamethasone;Gait training;Stair training;Functional mobility training;Therapeutic activities;Therapeutic exercise;Balance training;Neuromuscular re-education;Manual techniques;Orthotic Fit/Training;Patient/family education;Passive range of motion;Dry needling;Joint Manipulations;Spinal Manipulations;Vasopneumatic Device;Taping    PT Next Visit Plan  1-2x/wk, Pt may need to reduce to 1x/wk if pain too much to perform activity.  Manual and modalities as tolerated ofr pain. Progress ability for relaxing foot on floor with static standing and weight shifts. Gait training,    PT Home Exercise Plan  Ankle pumps, circles, toe flex/ext,   Desensitization daily    Recommended Other Services  Pt with questions on accupuncture, discussed that it may be helpful to improve sensitivity.    Consulted and Agree with Plan of Care  Patient       Patient will benefit from skilled therapeutic intervention in order to improve the following deficits and impairments:  Abnormal gait, Difficulty walking, Decreased endurance, Pain, Decreased balance, Impaired flexibility, Decreased strength, Decreased mobility  Visit Diagnosis: Pain in right ankle and joints of right foot  Other abnormalities of gait and mobility     Problem List Patient Active Problem List   Diagnosis Date Noted  . Fecal incontinence 01/15/2018  . Pre-diabetes 02/14/2017  . Left anterior cruciate ligament tear  08/03/2016  . Acute medial meniscus tear of left knee 08/03/2016  . Acute lateral meniscus tear of left knee 08/03/2016  . History of abnormal cervical Pap smear 09/21/2013  . Leukocytopenia, unspecified 03/02/2013  . S/P hip replacement 02/06/2013  . Hyperlipidemia 10/13/2012  . HTN (hypertension) 10/13/2012    Sedalia MutaLauren Letti Towell, PT, DPT 2:11 PM  04/02/19   [facility] [address] [place], [place], [address] Phone: [phone]   Fax:  [phone]  Name: Lisa PonderKim M [name] MRN: [id number] Date of Birth: 03/24/1971

## 2019-04-02 NOTE — Patient Instructions (Signed)
Ankle pumps x20 Ankle circles x20 Toe flex/ext x20 Desensitization

## 2019-04-07 ENCOUNTER — Other Ambulatory Visit: Payer: Self-pay

## 2019-04-07 ENCOUNTER — Ambulatory Visit: Payer: BC Managed Care – PPO | Admitting: Neurology

## 2019-04-07 ENCOUNTER — Encounter: Payer: Self-pay | Admitting: Neurology

## 2019-04-07 ENCOUNTER — Ambulatory Visit (INDEPENDENT_AMBULATORY_CARE_PROVIDER_SITE_OTHER): Payer: BC Managed Care – PPO | Admitting: *Deleted

## 2019-04-07 VITALS — BP 110/79 | HR 90 | Temp 97.8°F | Ht 68.0 in | Wt 149.5 lb

## 2019-04-07 DIAGNOSIS — M79674 Pain in right toe(s): Secondary | ICD-10-CM | POA: Diagnosis not present

## 2019-04-07 DIAGNOSIS — Z3042 Encounter for surveillance of injectable contraceptive: Secondary | ICD-10-CM

## 2019-04-07 MED ORDER — MEDROXYPROGESTERONE ACETATE 150 MG/ML IM SUSP
150.0000 mg | Freq: Once | INTRAMUSCULAR | Status: AC
  Administered 2019-04-07: 150 mg via INTRAMUSCULAR

## 2019-04-07 NOTE — Progress Notes (Signed)
Patient here today for depo provera injection. Patient in window.  Injection given and patient tolerated well.   Patient given depo provera calendar and she will call to make next appointment.

## 2019-04-07 NOTE — Progress Notes (Signed)
PATIENT: Lisa [name] DOB: [birth year]  Chief Complaint  Patient presents with  . Pain    Reports pain and swelling in her right, second toe since April 2020.  She has to wear open toe shoes.  Marland Kitchen PCP    Copland, Gay Filler, MD     HISTORICAL  Lisa [name] is of 48 year old female, seen in request by primary care physician Dr. Lamar Blinks for evaluation of right second toe pain, initial evaluation was on April 07, 2019.  I have reviewed and summarized the referring note from the referring physician.  She had a past medical history of hyperlipidemia,  Since April 2020, she began to notice right second toe swelling, painful when wearing shoes, it is gradually getting worse, then had noticeable change of her right second toe, there is callus forming at the end of the right toe, very painful and sensitive to touch, intermittent needle prick radiating pain, over the past few months, the pain and sensitivity also spreading to adjacent right first, third, second toes, but there was no noticeable discoloration and rest of the toes.  She has mild difficulty walking due to pain, no left lower extremity involvement, no bilateral upper extremity involvement  Laboratory evaluation showed normal ESR, CRP, uric acid, RA  She was seen by vein and vascular specialist, arterial Doppler study of bilateral lower extremity on January 14, 2019 was within normal limit. She was also evaluated by podiatrist, x-ray of right foot there is digital contraction, distal spotty osteoporosis of distal phalanx at right 2, 3, fourth,  She was given gabapentin 300 mg 3 times a day, which seems to help her symptoms some, she is also taking tramadol 50 mg 3 times a day  REVIEW OF SYSTEMS: Full 14 system review of systems performed and notable only for as above All other review of systems were negative.  ALLERGIES: Allergies  Allergen Reactions  . Nsaids Other (See Comments)    ulcers  . Latex     Surgical tape not  bandaid   . Nitroglycerin     Pt took Nitroglycerin patches - pt stated, "Made my skin bright red when I changed my patch; it made my foot itch and burn"    HOME MEDICATIONS: Current Outpatient Medications  Medication Sig Dispense Refill  . diphenoxylate-atropine (LOMOTIL) 2.5-0.025 MG tablet Take 1 tablet by mouth 4 (four) times daily as needed for diarrhea or loose stools. 360 tablet 1  . gabapentin (NEURONTIN) 300 MG capsule Take 1 capsule (300 mg total) by mouth 3 (three) times daily. 270 capsule 3  . MAGNESIUM PO Take 1 tablet by mouth daily.    . metoCLOPramide (REGLAN) 10 MG tablet Take 1 tablet (10 mg total) by mouth every 6 (six) hours as needed for nausea (or headache). 30 tablet 0  . ondansetron (ZOFRAN) 4 MG tablet Take 1 tablet (4 mg total) by mouth every 8 (eight) hours as needed for nausea or vomiting. 30 tablet 0  . pantoprazole (PROTONIX) 40 MG tablet Take 1 tablet (40 mg total) by mouth daily. 90 tablet 3  . POTASSIUM PO Take 1 tablet by mouth daily.    . simvastatin (ZOCOR) 20 MG tablet Take 1 tablet (20 mg total) by mouth every evening. 90 tablet 3  . SUMAtriptan (IMITREX) 50 MG tablet Take 1 tablet (50 mg total) by mouth every 2 (two) hours as needed for migraine. Max 100 mg in 24 hours 10 tablet 3  . traMADol (ULTRAM) 50 MG  tablet Take 1 tablet (50 mg total) by mouth 3 (three) times daily. 90 tablet 2  . Travoprost, BAK Free, (TRAVATAN Z) 0.004 % SOLN ophthalmic solution     . traZODone (DESYREL) 50 MG tablet TAKE 1/2 TO 1 TABLET BY MOUTH AT BEDTIME AS NEEDED FOR SLEEP. MAY TAKE 2 AS NEEDED 180 tablet 3  . medroxyPROGESTERone (DEPO-PROVERA) 150 MG/ML injection Inject 1 mL (150 mg total) into the muscle once. 1 mL 0   No current facility-administered medications for this visit.     PAST MEDICAL HISTORY: Past Medical History:  Diagnosis Date  . Anemia    hx of  . Anxiety 20 years ago  . Depression 20 years ago  . GERD (gastroesophageal reflux disease)   . Heart  murmur   . Hyperlipidemia   . Hypertension    history of htn, no meds now  . Ulcer    x2  . Vaginal Pap smear, abnormal     PAST SURGICAL HISTORY: Past Surgical History:  Procedure Laterality Date  . ANTERIOR CRUCIATE LIGAMENT REPAIR Left 09/04/2016   Procedure: LEFT KNEE MEDIAL COLLATERAL LIGAMENT REPAIR, ANTERIOR CRUCIATE LIGAMENT RECONSTRUCTION, MENISCAL DEBRIDEMENT VERSUS REPAIR;  Surgeon: Meredith Pel, MD;  Location: [facility];  Service: Orthopedics;  Laterality: Left;  . ESOPHAGOGASTRODUODENOSCOPY ENDOSCOPY     at least 4  . LAPAROSCOPIC APPENDECTOMY N/A 02/23/2015   Procedure: APPENDECTOMY LAPAROSCOPIC;  Surgeon: Greer Pickerel, MD;  Location: [facility];  Service: General;  Laterality: N/A;  . TOTAL HIP ARTHROPLASTY Left 02/06/2013   Procedure: LEFT TOTAL HIP ARTHROPLASTY ANTERIOR APPROACH;  Surgeon: Mcarthur Rossetti, MD;  Location: [facility];  Service: Orthopedics;  Laterality: Left;    FAMILY HISTORY: Family History  Adopted: Yes    SOCIAL HISTORY: Social History   Socioeconomic History  . Marital status: Single    Spouse name: Not on file  . Number of children: 1  . Years of education: college  . Highest education level: Bachelor's degree (e.g., BA, AB, BS)  Occupational History  . Not on file  Social Needs  . Financial resource strain: Not on file  . Food insecurity    Worry: Not on file    Inability: Not on file  . Transportation needs    Medical: Not on file    Non-medical: Not on file  Tobacco Use  . Smoking status: Former Smoker    Packs/day: 0.25    Years: 20.00    Pack years: 5.00    Types: Cigarettes    Quit date: 08/13/2013    Years since quitting: 5.6  . Smokeless tobacco: Never Used  Substance and Sexual Activity  . Alcohol use: Yes    Comment: few glasses of wine a week  . Drug use: No  . Sexual activity: Not on file  Lifestyle  . Physical activity    Days per week: Not on file    Minutes per session: Not on file  . Stress: Not on file   Relationships  . Social Herbalist on phone: Not on file    Gets together: Not on file    Attends religious service: Not on file    Active member of club or organization: Not on file    Attends meetings of clubs or organizations: Not on file    Relationship status: Not on file  . Intimate partner violence    Fear of current or ex partner: Not on file    Emotionally abused: Not on file  Physically abused: Not on file    Forced sexual activity: Not on file  Other Topics Concern  . Not on file  Social History Narrative   Lives at home with daughter.   Right-handed.   One can of Coke per day.     PHYSICAL EXAM   Vitals:   04/07/19 0713  BP: 110/79  Pulse: 90  Temp: 97.8 F (36.6 C)  Weight: 149 lb 8 oz (67.8 kg)  Height: '5\' 8"'$  (1.727 m)    Not recorded      Body mass index is 22.73 kg/m.  PHYSICAL EXAMNIATION:  Gen: NAD, conversant, well nourised, obese, well groomed                     Cardiovascular: Regular rate rhythm, no peripheral edema, warm, nontender. Eyes: Conjunctivae clear without exudates or hemorrhage Neck: Supple, no carotid bruits. Pulmonary: Clear to auscultation bilaterally   NEUROLOGICAL EXAM:  MENTAL STATUS: Speech:    Speech is normal; fluent and spontaneous with normal comprehension.  Cognition:     Orientation to time, place and person     Normal recent and remote memory     Normal Attention span and concentration     Normal Language, naming, repeating,spontaneous speech     Fund of knowledge   CRANIAL NERVES: CN II: Visual fields are full to confrontation. . Pupils are round equal and briskly reactive to light. CN III, IV, VI: extraocular movement are normal. No ptosis. CN V: Facial sensation is intact to pinprick in all 3 divisions bilaterally. Corneal responses are intact.  CN VII: Face is symmetric with normal eye closure and smile. CN VIII: Hearing is normal to rubbing fingers CN IX, X: Palate elevates  symmetrically. Phonation is normal. CN XI: Head turning and shoulder shrug are intact CN XII: Tongue is midline with normal movements and no atrophy.  MOTOR: Right second toe discoloration, callus at the tip of right second toe, very painful to touch, there was no noticeable discoloration at rest of the right toes, no muscle weakness  REFLEXES: Reflexes are 2+ and symmetric at the biceps, triceps, knees, and ankles. Plantar responses are flexor.  SENSORY: Intact to light touch, pinprick, positional sensation and vibratory sensation are intact in fingers and toes.  COORDINATION: Rapid alternating movements and fine finger movements are intact. There is no dysmetria on finger-to-nose and heel-knee-shin.    GAIT/STANCE: Mildly antalgic due to right second toe pain on bearing weight   DIAGNOSTIC DATA (LABS, IMAGING, TESTING) - I reviewed patient records, labs, notes, testing and imaging myself where available.   ASSESSMENT AND PLAN  Lisa [name] is a 48 y.o. female   Right second toe discoloration callus/ulcer at the tip of the right toe  Need to rule out autoimmune disorder, such as scleroderma, laboratory evaluations,  MRI of right foot  Continue gabapentin '300mg'$  tid  Marcial Pacas, M.D. Ph.D.  [facility] [address] [place],  [address] Ph: [phone] Fax: [phone]  CC: Copland, Gay Filler, MD

## 2019-04-08 ENCOUNTER — Telehealth: Payer: Self-pay | Admitting: Neurology

## 2019-04-08 ENCOUNTER — Other Ambulatory Visit: Payer: Self-pay | Admitting: *Deleted

## 2019-04-08 DIAGNOSIS — D72819 Decreased white blood cell count, unspecified: Secondary | ICD-10-CM

## 2019-04-08 DIAGNOSIS — M79674 Pain in right toe(s): Secondary | ICD-10-CM

## 2019-04-08 LAB — CBC WITH DIFFERENTIAL/PLATELET
Basophils Absolute: 0 10*3/uL (ref 0.0–0.2)
Basos: 1 %
EOS (ABSOLUTE): 0 10*3/uL (ref 0.0–0.4)
Eos: 1 %
Hematocrit: 37.4 % (ref 34.0–46.6)
Hemoglobin: 12.3 g/dL (ref 11.1–15.9)
Immature Grans (Abs): 0 10*3/uL (ref 0.0–0.1)
Immature Granulocytes: 0 %
Lymphocytes Absolute: 1.5 10*3/uL (ref 0.7–3.1)
Lymphs: 56 %
MCH: 33 pg (ref 26.6–33.0)
MCHC: 32.9 g/dL (ref 31.5–35.7)
MCV: 100 fL — ABNORMAL HIGH (ref 79–97)
Monocytes Absolute: 0.3 10*3/uL (ref 0.1–0.9)
Monocytes: 11 %
Neutrophils Absolute: 0.8 10*3/uL — ABNORMAL LOW (ref 1.4–7.0)
Neutrophils: 31 %
Platelets: 213 10*3/uL (ref 150–450)
RBC: 3.73 x10E6/uL — ABNORMAL LOW (ref 3.77–5.28)
RDW: 13.2 % (ref 11.7–15.4)
WBC: 2.7 10*3/uL — ABNORMAL LOW (ref 3.4–10.8)

## 2019-04-08 LAB — COMPREHENSIVE METABOLIC PANEL
ALT: 80 IU/L — ABNORMAL HIGH (ref 0–32)
AST: 147 IU/L — ABNORMAL HIGH (ref 0–40)
Albumin/Globulin Ratio: 1.8 (ref 1.2–2.2)
Albumin: 4.4 g/dL (ref 3.8–4.8)
Alkaline Phosphatase: 70 IU/L (ref 39–117)
BUN/Creatinine Ratio: 19 (ref 9–23)
BUN: 10 mg/dL (ref 6–24)
Bilirubin Total: 0.3 mg/dL (ref 0.0–1.2)
CO2: 22 mmol/L (ref 20–29)
Calcium: 9.2 mg/dL (ref 8.7–10.2)
Chloride: 104 mmol/L (ref 96–106)
Creatinine, Ser: 0.54 mg/dL — ABNORMAL LOW (ref 0.57–1.00)
GFR calc Af Amer: 131 mL/min/{1.73_m2} (ref >59)
GFR calc non Af Amer: 114 mL/min/{1.73_m2} (ref >59)
Globulin, Total: 2.5 g/dL (ref 1.5–4.5)
Glucose: 120 mg/dL — ABNORMAL HIGH (ref 65–99)
Potassium: 4.5 mmol/L (ref 3.5–5.2)
Sodium: 141 mmol/L (ref 134–144)
Total Protein: 6.9 g/dL (ref 6.0–8.5)

## 2019-04-08 LAB — RPR: RPR Ser Ql: NONREACTIVE

## 2019-04-08 LAB — VITAMIN B12: Vitamin B-12: 376 pg/mL (ref 232–1245)

## 2019-04-08 LAB — VITAMIN D 25 HYDROXY (VIT D DEFICIENCY, FRACTURES): Vit D, 25-Hydroxy: 5.4 ng/mL — ABNORMAL LOW (ref 30.0–100.0)

## 2019-04-08 LAB — ANA W/REFLEX IF POSITIVE: Anti Nuclear Antibody (ANA): NEGATIVE

## 2019-04-08 MED ORDER — GABAPENTIN 300 MG PO CAPS: 600.0000 mg | ORAL_CAPSULE | Freq: Three times a day (TID) | ORAL | 3 refills | Status: DC

## 2019-04-08 MED ORDER — VITAMIN D (ERGOCALCIFEROL) 1.25 MG (50000 UNIT) PO CAPS: 50000.0000 [IU] | ORAL_CAPSULE | ORAL | 0 refills | Status: DC

## 2019-04-08 NOTE — Telephone Encounter (Signed)
Please call patient, laboratory evaluation showed  Significantly decreased vitamin D level, only 5.4, I have called in Vit D 50,000 units every week for 8 weeks, then she should start over-the-counter vitamin D3 supplement 1000 to 2000 units daily  Decreased WBC 2.7, with decreased absolute neutrophil, this is a change compared to laboratory evaluation in August 2019,  CMP showed abnormal liver functional test, elevated AST 147, ALT 80, it was normal in April 2019  Rest of the laboratory evaluations were normal  I will refer her to hematologist for further evaluation

## 2019-04-08 NOTE — Telephone Encounter (Signed)
I spoke to the patient and she verbalized understanding of her lab results.  She is agreeable to the vitamin D supplement (understands dosing schedule) and to the hematology referral.  She increased her gabapentin 300mg , one capsule TID to two capsules TID.  Feels the added dosage has been helpful for her symptoms.  She is requesting a new rx from Dr. Krista Blue.  Per vo by Dr. Krista Blue, ok to provide new prescription.

## 2019-04-08 NOTE — Telephone Encounter (Signed)
Noted, order sent to GI they will reach out to the patient to schedule.  

## 2019-04-08 NOTE — Telephone Encounter (Signed)
MRI of right foot w/wo:  [id number], Good through Oct 04 2019, diagnostic imaging [facility].

## 2019-04-08 NOTE — Telephone Encounter (Signed)
I was unable to get this MRI approved on my level I spoke to a [employer] nurse she is unable to get it on her level. The phone number for the peer to peer is [phone]. The member ID is [id number] & DOB is 1971/03/21. The case does close on Friday 04/10/19. It doesn't need to be scheduled it just needs to be called to do the peer to peer.

## 2019-04-16 ENCOUNTER — Ambulatory Visit: Payer: BC Managed Care – PPO

## 2019-04-16 ENCOUNTER — Telehealth: Payer: Self-pay | Admitting: Hematology and Oncology

## 2019-04-16 NOTE — Telephone Encounter (Signed)
A new hem appt has been scheduled for [name] to see Dr. Lindi Adie on 9/29 at 345pm. [name] was offered an earlier appt date and time but stated she needed the later time. She's been made aware to arrive 15 minutes early. I provided the address and location to our facility.

## 2019-04-21 ENCOUNTER — Ambulatory Visit: Payer: BC Managed Care – PPO

## 2019-04-23 ENCOUNTER — Ambulatory Visit: Payer: BC Managed Care – PPO

## 2019-04-28 ENCOUNTER — Ambulatory Visit: Payer: BC Managed Care – PPO

## 2019-04-30 ENCOUNTER — Ambulatory Visit: Payer: BC Managed Care – PPO

## 2019-05-01 ENCOUNTER — Other Ambulatory Visit: Payer: Self-pay | Admitting: Neurology

## 2019-05-04 ENCOUNTER — Other Ambulatory Visit: Payer: Self-pay | Admitting: Neurology

## 2019-05-04 MED ORDER — LIDOCAINE 4 % EX PTCH: 1.0000 | MEDICATED_PATCH | Freq: Every day | CUTANEOUS | 11 refills | Status: DC | PRN

## 2019-05-04 NOTE — Progress Notes (Signed)
Lidocaine patch Rx was called in

## 2019-05-05 ENCOUNTER — Ambulatory Visit: Payer: BC Managed Care – PPO

## 2019-05-06 ENCOUNTER — Ambulatory Visit
Admission: RE | Admit: 2019-05-06 | Discharge: 2019-05-06 | Disposition: A | Discharge status: Home / Self Care | Payer: BC Managed Care – PPO | Source: Ambulatory Visit | Attending: Neurology | Admitting: Neurology

## 2019-05-06 ENCOUNTER — Other Ambulatory Visit: Payer: Self-pay

## 2019-05-06 DIAGNOSIS — M79674 Pain in right toe(s): Secondary | ICD-10-CM

## 2019-05-08 ENCOUNTER — Encounter: Payer: Self-pay | Admitting: Family Medicine

## 2019-05-08 DIAGNOSIS — M7989 Other specified soft tissue disorders: Secondary | ICD-10-CM

## 2019-05-08 DIAGNOSIS — M79674 Pain in right toe(s): Secondary | ICD-10-CM

## 2019-05-08 MED ORDER — TRAMADOL HCL 50 MG PO TABS: 50.0000 mg | ORAL_TABLET | Freq: Three times a day (TID) | ORAL | 1 refills | Status: DC

## 2019-05-08 MED ORDER — GABAPENTIN 300 MG PO CAPS: 600.0000 mg | ORAL_CAPSULE | Freq: Four times a day (QID) | ORAL | 1 refills | Status: DC

## 2019-05-11 NOTE — Progress Notes (Signed)
[facility] CONSULT NOTE  Patient Care Team: Copland, Gay Filler, MD as PCP - General (Family Medicine)  CHIEF COMPLAINTS/PURPOSE OF CONSULTATION:  Newly diagnosed neutropenia  HISTORY OF PRESENTING ILLNESS:  Lisa [name] 48 y.o. female is here because of recent diagnosis of leukopenia.  Labs on 04/07/19 showed: Hg 12.3, HCT 37.4, MCV 100, WBC 2,700, ANC 0.8, She presents to the clinic today for initial evaluation.  For the past couple of months she has had problems with her left middle toe which has become red and purple in the color.  She has seen vascular and podiatry and they do not know the etiology.  Apparently blood work was done for that and she does not know the results.  About a month ago her CBC revealed a white count of 2.7 with an Hardy of 0.8 and we have been asked to consult on this issue.  This is the first time she has had low white count.  Apart from gabapentin and Ultram there were no other medications prescribed recently.  She did not take any antibiotics either.  I reviewed her records extensively and collaborated the history with the patient.  MEDICAL HISTORY:  Past Medical History:  Diagnosis Date  . Anemia    hx of  . Anxiety 20 years ago  . Depression 20 years ago  . GERD (gastroesophageal reflux disease)   . Heart murmur   . Hyperlipidemia   . Hypertension    history of htn, no meds now  . Ulcer    x2  . Vaginal Pap smear, abnormal     SURGICAL HISTORY: Past Surgical History:  Procedure Laterality Date  . ANTERIOR CRUCIATE LIGAMENT REPAIR Left 09/04/2016   Procedure: LEFT KNEE MEDIAL COLLATERAL LIGAMENT REPAIR, ANTERIOR CRUCIATE LIGAMENT RECONSTRUCTION, MENISCAL DEBRIDEMENT VERSUS REPAIR;  Surgeon: Meredith Pel, MD;  Location: [facility];  Service: Orthopedics;  Laterality: Left;  . ESOPHAGOGASTRODUODENOSCOPY ENDOSCOPY     at least 4  . LAPAROSCOPIC APPENDECTOMY N/A 02/23/2015   Procedure: APPENDECTOMY LAPAROSCOPIC;  Surgeon: Greer Pickerel,  MD;  Location: [facility];  Service: General;  Laterality: N/A;  . TOTAL HIP ARTHROPLASTY Left 02/06/2013   Procedure: LEFT TOTAL HIP ARTHROPLASTY ANTERIOR APPROACH;  Surgeon: Mcarthur Rossetti, MD;  Location: [facility];  Service: Orthopedics;  Laterality: Left;    SOCIAL HISTORY: Social History   Socioeconomic History  . Marital status: Single    Spouse name: Not on file  . Number of children: 1  . Years of education: college  . Highest education level: Bachelor's degree (e.g., BA, AB, BS)  Occupational History  . Not on file  Social Needs  . Financial resource strain: Not on file  . Food insecurity    Worry: Not on file    Inability: Not on file  . Transportation needs    Medical: Not on file    Non-medical: Not on file  Tobacco Use  . Smoking status: Former Smoker    Packs/day: 0.25    Years: 20.00    Pack years: 5.00    Types: Cigarettes    Quit date: 08/13/2013    Years since quitting: 5.7  . Smokeless tobacco: Never Used  Substance and Sexual Activity  . Alcohol use: Yes    Comment: few glasses of wine a week  . Drug use: No  . Sexual activity: Not on file  Lifestyle  . Physical activity    Days per week: Not on file    Minutes per session:  Not on file  . Stress: Not on file  Relationships  . Social Herbalist on phone: Not on file    Gets together: Not on file    Attends religious service: Not on file    Active member of club or organization: Not on file    Attends meetings of clubs or organizations: Not on file    Relationship status: Not on file  . Intimate partner violence    Fear of current or ex partner: Not on file    Emotionally abused: Not on file    Physically abused: Not on file    Forced sexual activity: Not on file  Other Topics Concern  . Not on file  Social History Narrative   Lives at home with daughter.   Right-handed.   One can of Coke per day.    FAMILY HISTORY: Family History  Adopted: Yes    ALLERGIES:  is allergic  to nsaids; latex; and nitroglycerin.  MEDICATIONS:  Current Outpatient Medications  Medication Sig Dispense Refill  . diphenoxylate-atropine (LOMOTIL) 2.5-0.025 MG tablet Take 1 tablet by mouth 4 (four) times daily as needed for diarrhea or loose stools. 360 tablet 1  . gabapentin (NEURONTIN) 300 MG capsule Take 2 capsules (600 mg total) by mouth 4 (four) times daily. 720 capsule 1  . Lidocaine (HM LIDOCAINE PATCH) 4 % PTCH Apply 1 patch topically daily as needed. 10 patch 11  . MAGNESIUM PO Take 1 tablet by mouth daily.    . medroxyPROGESTERone (DEPO-PROVERA) 150 MG/ML injection Inject 1 mL (150 mg total) into the muscle once. 1 mL 0  . metoCLOPramide (REGLAN) 10 MG tablet Take 1 tablet (10 mg total) by mouth every 6 (six) hours as needed for nausea (or headache). 30 tablet 0  . ondansetron (ZOFRAN) 4 MG tablet Take 1 tablet (4 mg total) by mouth every 8 (eight) hours as needed for nausea or vomiting. 30 tablet 0  . pantoprazole (PROTONIX) 40 MG tablet Take 1 tablet (40 mg total) by mouth daily. 90 tablet 3  . POTASSIUM PO Take 1 tablet by mouth daily.    . simvastatin (ZOCOR) 20 MG tablet Take 1 tablet (20 mg total) by mouth every evening. 90 tablet 3  . SUMAtriptan (IMITREX) 50 MG tablet Take 1 tablet (50 mg total) by mouth every 2 (two) hours as needed for migraine. Max 100 mg in 24 hours 10 tablet 3  . traMADol (ULTRAM) 50 MG tablet Take 1 tablet (50 mg total) by mouth 3 (three) times daily. 90 tablet 1  . Travoprost, BAK Free, (TRAVATAN Z) 0.004 % SOLN ophthalmic solution     . traZODone (DESYREL) 50 MG tablet TAKE 1/2 TO 1 TABLET BY MOUTH AT BEDTIME AS NEEDED FOR SLEEP. MAY TAKE 2 AS NEEDED 180 tablet 3  . Vitamin D, Ergocalciferol, (DRISDOL) 1.25 MG (50000 UT) CAPS capsule Take 1 capsule (50,000 Units total) by mouth every 7 (seven) days. 8 capsule 0   No current facility-administered medications for this visit.     REVIEW OF SYSTEMS:   Constitutional: Denies fevers, chills or  abnormal night sweats Eyes: Denies blurriness of vision, double vision or watery eyes Ears, nose, mouth, throat, and face: Denies mucositis or sore throat Respiratory: Denies cough, dyspnea or wheezes Cardiovascular: Denies palpitation, chest discomfort or lower extremity swelling Gastrointestinal:  Denies nausea, heartburn or change in bowel habits Skin: Denies abnormal skin rashes Lymphatics: Denies new lymphadenopathy or easy bruising Neurological:Denies numbness, tingling or new  weaknesses Behavioral/Psych: Mood is stable, no new changes  Breast: Denies any palpable lumps or discharge Extremities: Left middle toe inflamed and painful on gabapentin All other systems were reviewed with the patient and are negative.  PHYSICAL EXAMINATION: ECOG PERFORMANCE STATUS: 1 - Symptomatic but completely ambulatory  Vitals:   05/12/19 1541  BP: (!) 150/100  Pulse: (!) 108  Resp: 18  Temp: 98.2 F (36.8 C)  SpO2: 100%   Filed Weights   05/12/19 1541  Weight: 153 lb 9.6 oz (69.7 kg)    GENERAL:alert, no distress and comfortable SKIN: skin color, texture, turgor are normal, no rashes or significant lesions EYES: normal, conjunctiva are pink and non-injected, sclera clear OROPHARYNX:no exudate, no erythema and lips, buccal mucosa, and tongue normal  NECK: supple, thyroid normal size, non-tender, without nodularity LYMPH:  no palpable lymphadenopathy in the cervical, axillary or inguinal LUNGS: clear to auscultation and percussion with normal breathing effort HEART: regular rate & rhythm and no murmurs and no lower extremity edema ABDOMEN:abdomen soft, non-tender and normal bowel sounds Musculoskeletal:no cyanosis of digits and no clubbing  PSYCH: alert & oriented x 3 with fluent speech NEURO: no focal motor/sensory deficits  LABORATORY DATA:  I have reviewed the data as listed Lab Results  Component Value Date   WBC 2.7 (L) 04/07/2019   HGB 12.3 04/07/2019   HCT 37.4 04/07/2019    MCV 100 (H) 04/07/2019   PLT 213 04/07/2019   Lab Results  Component Value Date   NA 141 04/07/2019   K 4.5 04/07/2019   CL 104 04/07/2019   CO2 22 04/07/2019    RADIOGRAPHIC STUDIES: I have personally reviewed the radiological reports and agreed with the findings in the report.  ASSESSMENT AND PLAN:  Leukocytopenia Lab review: 04/07/2019: WBC 2.7, hemoglobin 12.3, MCV 100, platelets 213, ANC 0.8, ALC 1.5 Isolated first episode of leukopenia: Differential diagnosis medication induced versus autoimmune disease versus transient bone marrow dysfunction  Plan: 1.  Recheck CBC with differential and review blood smear 2.  ANA 3.  M84 and folic acid  Bone marrow biopsy will be indicated if she has persistent/worsening cytopenias. I will call her tomorrow to discuss results.   All questions were answered. The patient knows to call the clinic with any problems, questions or concerns.   Rulon Eisenmenger, MD 05/12/2019    I, Molly Dorshimer, am acting as scribe for Nicholas Lose, MD.  I have reviewed the above documentation for accuracy and completeness, and I agree with the above.

## 2019-05-12 ENCOUNTER — Inpatient Hospital Stay: Payer: BC Managed Care – PPO | Attending: Hematology and Oncology | Admitting: Hematology and Oncology

## 2019-05-12 ENCOUNTER — Other Ambulatory Visit: Payer: Self-pay

## 2019-05-12 ENCOUNTER — Inpatient Hospital Stay: Payer: BC Managed Care – PPO

## 2019-05-12 DIAGNOSIS — F329 Major depressive disorder, single episode, unspecified: Secondary | ICD-10-CM | POA: Insufficient documentation

## 2019-05-12 DIAGNOSIS — D709 Neutropenia, unspecified: Secondary | ICD-10-CM | POA: Insufficient documentation

## 2019-05-12 DIAGNOSIS — I10 Essential (primary) hypertension: Secondary | ICD-10-CM

## 2019-05-12 DIAGNOSIS — Z87891 Personal history of nicotine dependence: Secondary | ICD-10-CM

## 2019-05-12 DIAGNOSIS — F419 Anxiety disorder, unspecified: Secondary | ICD-10-CM

## 2019-05-12 DIAGNOSIS — Z79899 Other long term (current) drug therapy: Secondary | ICD-10-CM | POA: Insufficient documentation

## 2019-05-12 LAB — CBC WITH DIFFERENTIAL (CANCER CENTER ONLY)
Abs Immature Granulocytes: 0.02 10*3/uL (ref 0.00–0.07)
Basophils Absolute: 0 10*3/uL (ref 0.0–0.1)
Basophils Relative: 1 %
Eosinophils Absolute: 0 10*3/uL (ref 0.0–0.5)
Eosinophils Relative: 1 %
HCT: 37 % (ref 36.0–46.0)
Hemoglobin: 12 g/dL (ref 12.0–15.0)
Immature Granulocytes: 1 %
Lymphocytes Relative: 38 %
Lymphs Abs: 1.5 10*3/uL (ref 0.7–4.0)
MCH: 33.2 pg (ref 26.0–34.0)
MCHC: 32.4 g/dL (ref 30.0–36.0)
MCV: 102.5 fL — ABNORMAL HIGH (ref 80.0–100.0)
Monocytes Absolute: 0.4 10*3/uL (ref 0.1–1.0)
Monocytes Relative: 10 %
Neutro Abs: 1.9 10*3/uL (ref 1.7–7.7)
Neutrophils Relative %: 49 %
Platelet Count: 244 10*3/uL (ref 150–400)
RBC: 3.61 MIL/uL — ABNORMAL LOW (ref 3.87–5.11)
RDW: 12.6 % (ref 11.5–15.5)
WBC Count: 3.9 10*3/uL — ABNORMAL LOW (ref 4.0–10.5)
nRBC: 0 % (ref 0.0–0.2)

## 2019-05-12 LAB — FOLATE: Folate: 5.1 ng/mL — ABNORMAL LOW (ref >5.9)

## 2019-05-12 LAB — VITAMIN B12: Vitamin B-12: 137 pg/mL — ABNORMAL LOW (ref 180–914)

## 2019-05-12 NOTE — Assessment & Plan Note (Signed)
Lab review: 04/07/2019: WBC 2.7, hemoglobin 12.3, MCV 100, platelets 213, ANC 0.8, ALC 1.5 Isolated first episode of leukopenia: Differential diagnosis medication induced versus autoimmune disease versus transient bone marrow dysfunction  Plan: 1.  Recheck CBC with differential and review blood smear 2.  ANA 3.  O11 and folic acid  Bone marrow biopsy will be indicated if she has persistent/worsening cytopenias.

## 2019-05-13 ENCOUNTER — Ambulatory Visit: Payer: BC Managed Care – PPO | Admitting: Hematology and Oncology

## 2019-05-13 ENCOUNTER — Other Ambulatory Visit: Payer: Self-pay | Admitting: Hematology and Oncology

## 2019-05-13 ENCOUNTER — Telehealth: Payer: Self-pay | Admitting: Hematology and Oncology

## 2019-05-13 DIAGNOSIS — D709 Neutropenia, unspecified: Secondary | ICD-10-CM

## 2019-05-13 DIAGNOSIS — E538 Deficiency of other specified B group vitamins: Secondary | ICD-10-CM

## 2019-05-13 LAB — ANTINUCLEAR ANTIBODIES, IFA: ANA Ab, IFA: NEGATIVE

## 2019-05-13 NOTE — Telephone Encounter (Signed)
I talk with patient regarding 9/30 phone visit

## 2019-05-13 NOTE — Progress Notes (Signed)
I reviewed the blood work with the patient. Patient was profoundly B12 deficient and also mildly folic acid deficient. I instructed her to take 5000 mcg of oral B12 daily. Also to take 1 mg of folic acid daily. We will recheck her labs in 3 months and then call her the following day to discuss the results through a telephone visit. I also informed her that the white count has improved to 3.9 and the differential is normal and therefore she does not have any further issues with leukopenia.

## 2019-05-15 ENCOUNTER — Telehealth: Payer: Self-pay | Admitting: Hematology and Oncology

## 2019-05-15 NOTE — Telephone Encounter (Signed)
Scheduled appt per 9/30 sch message - pt aware of appt date and time   

## 2019-05-18 ENCOUNTER — Encounter: Payer: Self-pay | Admitting: Family Medicine

## 2019-05-20 DIAGNOSIS — H524 Presbyopia: Secondary | ICD-10-CM | POA: Diagnosis not present

## 2019-05-20 DIAGNOSIS — H5213 Myopia, bilateral: Secondary | ICD-10-CM | POA: Diagnosis not present

## 2019-05-20 DIAGNOSIS — H52223 Regular astigmatism, bilateral: Secondary | ICD-10-CM | POA: Diagnosis not present

## 2019-06-19 ENCOUNTER — Other Ambulatory Visit: Payer: Self-pay | Admitting: Neurology

## 2019-06-24 DIAGNOSIS — H401121 Primary open-angle glaucoma, left eye, mild stage: Secondary | ICD-10-CM | POA: Diagnosis not present

## 2019-06-24 DIAGNOSIS — H401112 Primary open-angle glaucoma, right eye, moderate stage: Secondary | ICD-10-CM | POA: Diagnosis not present

## 2019-06-25 ENCOUNTER — Ambulatory Visit (INDEPENDENT_AMBULATORY_CARE_PROVIDER_SITE_OTHER): Payer: BC Managed Care – PPO | Admitting: *Deleted

## 2019-06-25 ENCOUNTER — Other Ambulatory Visit: Payer: Self-pay

## 2019-06-25 DIAGNOSIS — Z3042 Encounter for surveillance of injectable contraceptive: Secondary | ICD-10-CM | POA: Diagnosis not present

## 2019-06-25 MED ORDER — MEDROXYPROGESTERONE ACETATE 150 MG/ML IM SUSP
150.0000 mg | Freq: Once | INTRAMUSCULAR | Status: AC
  Administered 2019-06-25: 150 mg via INTRAMUSCULAR

## 2019-06-25 NOTE — Progress Notes (Signed)
Patient here today for depo provera injection. Patient in window.  Injection given and patient tolerated well.  Patient given depo provera calendar and she will call to make next appointment.  Patient due 09/10/19 - 09/24/19

## 2019-07-02 ENCOUNTER — Encounter: Payer: Self-pay | Admitting: Family Medicine

## 2019-07-02 DIAGNOSIS — R3915 Urgency of urination: Secondary | ICD-10-CM | POA: Diagnosis not present

## 2019-07-02 DIAGNOSIS — M79674 Pain in right toe(s): Secondary | ICD-10-CM

## 2019-07-02 DIAGNOSIS — E7849 Other hyperlipidemia: Secondary | ICD-10-CM

## 2019-07-02 DIAGNOSIS — M7989 Other specified soft tissue disorders: Secondary | ICD-10-CM

## 2019-07-02 DIAGNOSIS — I10 Essential (primary) hypertension: Secondary | ICD-10-CM

## 2019-07-02 DIAGNOSIS — N3000 Acute cystitis without hematuria: Secondary | ICD-10-CM | POA: Diagnosis not present

## 2019-07-02 MED ORDER — SIMVASTATIN 20 MG PO TABS: 20.0000 mg | ORAL_TABLET | Freq: Every evening | ORAL | 3 refills | Status: DC

## 2019-07-08 ENCOUNTER — Encounter: Payer: Self-pay | Admitting: Family Medicine

## 2019-07-14 NOTE — Telephone Encounter (Signed)
BP Readings from Last 3 Encounters:  05/12/19 (!) 150/100  04/07/19 110/79  02/05/19 (!) 131/95   Called the eye center and LM for the doctor to call me back with more details

## 2019-07-15 MED ORDER — LISINOPRIL 10 MG PO TABS: 10.0000 mg | ORAL_TABLET | Freq: Every day | ORAL | 3 refills | Status: DC

## 2019-07-15 NOTE — Addendum Note (Signed)
Addended by: Lamar Blinks C on: 07/15/2019 09:52 AM   Modules accepted: Orders

## 2019-07-29 DIAGNOSIS — Z20828 Contact with and (suspected) exposure to other viral communicable diseases: Secondary | ICD-10-CM | POA: Diagnosis not present

## 2019-08-01 ENCOUNTER — Other Ambulatory Visit: Payer: Self-pay | Admitting: Family Medicine

## 2019-08-01 DIAGNOSIS — R197 Diarrhea, unspecified: Secondary | ICD-10-CM

## 2019-08-03 NOTE — Telephone Encounter (Signed)
Refill Request: Lomotil 2.5-0.025mg  Last Refill: 12/15/2018, #360 w/ 1 refill Last OV: 12/15/2018 Next OV: None  Please advise.

## 2019-08-10 ENCOUNTER — Other Ambulatory Visit: Payer: Self-pay

## 2019-08-10 ENCOUNTER — Inpatient Hospital Stay: Payer: BC Managed Care – PPO | Attending: Hematology and Oncology

## 2019-08-10 ENCOUNTER — Encounter: Payer: Self-pay | Admitting: Family Medicine

## 2019-08-10 DIAGNOSIS — D709 Neutropenia, unspecified: Secondary | ICD-10-CM

## 2019-08-10 DIAGNOSIS — D72819 Decreased white blood cell count, unspecified: Secondary | ICD-10-CM | POA: Insufficient documentation

## 2019-08-10 DIAGNOSIS — R7989 Other specified abnormal findings of blood chemistry: Secondary | ICD-10-CM | POA: Insufficient documentation

## 2019-08-10 DIAGNOSIS — Z1231 Encounter for screening mammogram for malignant neoplasm of breast: Secondary | ICD-10-CM | POA: Diagnosis not present

## 2019-08-10 DIAGNOSIS — E538 Deficiency of other specified B group vitamins: Secondary | ICD-10-CM | POA: Diagnosis not present

## 2019-08-10 LAB — CBC WITH DIFFERENTIAL (CANCER CENTER ONLY)
Abs Immature Granulocytes: 0.01 10*3/uL (ref 0.00–0.07)
Basophils Absolute: 0 10*3/uL (ref 0.0–0.1)
Basophils Relative: 1 %
Eosinophils Absolute: 0.1 10*3/uL (ref 0.0–0.5)
Eosinophils Relative: 1 %
HCT: 38.1 % (ref 36.0–46.0)
Hemoglobin: 12.2 g/dL (ref 12.0–15.0)
Immature Granulocytes: 0 %
Lymphocytes Relative: 44 %
Lymphs Abs: 1.9 10*3/uL (ref 0.7–4.0)
MCH: 32.6 pg (ref 26.0–34.0)
MCHC: 32 g/dL (ref 30.0–36.0)
MCV: 101.9 fL — ABNORMAL HIGH (ref 80.0–100.0)
Monocytes Absolute: 0.3 10*3/uL (ref 0.1–1.0)
Monocytes Relative: 6 %
Neutro Abs: 2 10*3/uL (ref 1.7–7.7)
Neutrophils Relative %: 48 %
Platelet Count: 242 10*3/uL (ref 150–400)
RBC: 3.74 MIL/uL — ABNORMAL LOW (ref 3.87–5.11)
RDW: 12.2 % (ref 11.5–15.5)
WBC Count: 4.2 10*3/uL (ref 4.0–10.5)
nRBC: 0 % (ref 0.0–0.2)

## 2019-08-10 LAB — FOLATE: Folate: 24 ng/mL (ref >5.9)

## 2019-08-10 LAB — FERRITIN: Ferritin: 2336 ng/mL — ABNORMAL HIGH (ref 11–307)

## 2019-08-10 LAB — IRON AND TIBC
Iron: 254 ug/dL — ABNORMAL HIGH (ref 41–142)
Saturation Ratios: 102 % — ABNORMAL HIGH (ref 21–57)
TIBC: 250 ug/dL (ref 236–444)
UIBC: UNDETERMINED ug/dL (ref 120–384)

## 2019-08-10 LAB — VITAMIN B12: Vitamin B-12: 2225 pg/mL — ABNORMAL HIGH (ref 180–914)

## 2019-08-10 NOTE — Progress Notes (Signed)
  HEMATOLOGY-ONCOLOGY TELEPHONE VISIT PROGRESS NOTE  I connected with Lisa [name] on 08/11/2019 at 11:30 AM EST by telephone and verified that I am speaking with the correct person using two identifiers.  I discussed the limitations, risks, security and privacy concerns of performing an evaluation and management service by telephone and the availability of in person appointments.  I also discussed with the patient that there may be a patient responsible charge related to this service. The patient expressed understanding and agreed to proceed.   History of Present Illness: Lisa [name] is a 48 y.o. female with above-mentioned history of leukocytopenia, folic acid deficiency, and B-12 deficiency. Labs on 08/10/19 showed WBC 4.2, ANC 2.0, iron saturation 102%, ferritin 2,336, folate 24.0, B-12 2,225. She is over the phone today to review her labs.   Observations/Objective:  The numbness in the toes has resolved after receiving B12 replacement   Assessment Plan:  Leukocytopenia Lab review:  04/07/2019: WBC 2.7, hemoglobin 12.3, MCV 100, platelets 213, ANC 0.8, ALC 1.5 08/10/2019: WBC 4.2, hemoglobin 12.2, MCV 101.9, platelets 242, ANC 2, ALC 1.9, folic acid 24, Q76 1950 (previously was 137), ferritin 2336, iron saturation 102%  Lab review: I discussed with her the WBC count and B12 have improved dramatically.  She can continue B12 supplementation and may drop the dose to 1000 mcg daily.  Elevated ferritin: Greater than 2000, iron saturation 102% worrisome for hemochromatosis.  We will repeat the iron studies as well as get hemochromatosis gene testing.  From the WBC standpoint there is no further work-up necessary. Return to clinic based upon the hemochromatosis test results.   I discussed the assessment and treatment plan with the patient. The patient was provided an opportunity to ask questions and all were answered. The patient agreed with the plan and demonstrated an understanding of the  instructions. The patient was advised to call back or seek an in-person evaluation if the symptoms worsen or if the condition fails to improve as anticipated.   I provided 15 minutes of non-face-to-face time during this encounter.   Rulon Eisenmenger, MD 08/11/2019    I, Molly Dorshimer, am acting as scribe for Nicholas Lose, MD.  I have reviewed the above documentation for accuracy and completeness, and I agree with the above.

## 2019-08-11 ENCOUNTER — Inpatient Hospital Stay (HOSPITAL_BASED_OUTPATIENT_CLINIC_OR_DEPARTMENT_OTHER): Payer: BC Managed Care – PPO | Admitting: Hematology and Oncology

## 2019-08-11 DIAGNOSIS — D709 Neutropenia, unspecified: Secondary | ICD-10-CM | POA: Diagnosis not present

## 2019-08-11 DIAGNOSIS — R7989 Other specified abnormal findings of blood chemistry: Secondary | ICD-10-CM

## 2019-08-11 NOTE — Assessment & Plan Note (Signed)
Lab review:  04/07/2019: WBC 2.7, hemoglobin 12.3, MCV 100, platelets 213, ANC 0.8, ALC 1.5 08/10/2019: WBC 4.2, hemoglobin 12.2, MCV 101.9, platelets 242, ANC 2, ALC 1.9, folic acid 24, Y77 4128 (previously was 137), ferritin 2336, iron saturation 102%  Lab review: I discussed with her the WBC count and B12 have improved dramatically.  She can continue B12 supplementation and may drop the dose to 1000 mcg daily. The ferritin level and iron saturation indicate an error in the readings.  I would like to repeat them today.  If they were accurate then I might have to evaluate her for hemochromatosis.  I will call her with results of these labs and discuss the next steps. From the WBC standpoint there is no further work-up necessary.

## 2019-08-13 ENCOUNTER — Telehealth: Payer: Self-pay | Admitting: Hematology and Oncology

## 2019-08-13 NOTE — Telephone Encounter (Signed)
Scheduled per 12/29 los, left a voicemail. °

## 2019-08-19 ENCOUNTER — Inpatient Hospital Stay: Payer: BC Managed Care – PPO | Attending: Hematology and Oncology

## 2019-08-19 ENCOUNTER — Other Ambulatory Visit: Payer: Self-pay

## 2019-08-19 DIAGNOSIS — E538 Deficiency of other specified B group vitamins: Secondary | ICD-10-CM | POA: Diagnosis not present

## 2019-08-19 DIAGNOSIS — D72819 Decreased white blood cell count, unspecified: Secondary | ICD-10-CM | POA: Diagnosis not present

## 2019-08-19 DIAGNOSIS — M7989 Other specified soft tissue disorders: Secondary | ICD-10-CM | POA: Diagnosis not present

## 2019-08-19 DIAGNOSIS — M79674 Pain in right toe(s): Secondary | ICD-10-CM | POA: Diagnosis not present

## 2019-08-19 DIAGNOSIS — R7989 Other specified abnormal findings of blood chemistry: Secondary | ICD-10-CM

## 2019-08-19 LAB — IRON AND TIBC
Iron: 152 ug/dL — ABNORMAL HIGH (ref 41–142)
Saturation Ratios: 55 % (ref 21–57)
TIBC: 274 ug/dL (ref 236–444)
UIBC: 122 ug/dL (ref 120–384)

## 2019-08-19 LAB — FERRITIN: Ferritin: 2520 ng/mL — ABNORMAL HIGH (ref 11–307)

## 2019-08-24 LAB — HEMOCHROMATOSIS DNA-PCR(C282Y,H63D)

## 2019-08-26 NOTE — Progress Notes (Signed)
  HEMATOLOGY-ONCOLOGY TELEPHONE VISIT PROGRESS NOTE  I connected with Lisa [name] on 08/27/2019 at  8:30 AM EST by telephone and verified that I am speaking with the correct person using two identifiers.  I discussed the limitations, risks, security and privacy concerns of performing an evaluation and management service by telephone and the availability of in person appointments.  I also discussed with the patient that there may be a patient responsible charge related to this service. The patient expressed understanding and agreed to proceed.   History of Present Illness: Lisa [name] is a 49 y.o. 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 Labs on 08/19/19 showed iron saturation 55%, ferritin 2,520, and one copy of the H63D mutation. She is over the phone today to review her labs.    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 I recommended phlebotomy treatment for her. [facility] will call [facility] and get phlebotomies every 2 months. We will do a phlebotomy next month. Repeat labs in 3 months and then I will have a telephone visit with her to go over the iron results so that we can decide on the frequency of phlebotomies.  I discussed with her the problems with having too much iron including liver damage pancreatic insufficiency/diabetes, joint pains and arthritis as well as skin discoloration etc.  The goal would be to reduce  the ferritin to less than 100 Also encouraged her not to eat iron-containing foods and significant quantities.  B12 supplementation dose reduced to 1000 mcg daily.  I recommended monitoring with every 3 month labs.      I discussed the assessment and treatment plan with the patient. The patient was provided an opportunity to ask questions and all were answered. The patient agreed with the plan and demonstrated an understanding of the instructions. The patient was advised to call back or seek an in-person evaluation if the symptoms worsen or if the condition fails to improve as anticipated.   I provided 15 minutes of non-face-to-face time during this encounter.   Sabas Sous, MD 08/27/2019    I, Molly Dorshimer, am acting as scribe for Serena Croissant, MD.  I have reviewed the above documentation for accuracy and completeness, and I agree with the above.

## 2019-08-27 ENCOUNTER — Other Ambulatory Visit: Payer: BC Managed Care – PPO

## 2019-08-27 ENCOUNTER — Inpatient Hospital Stay (HOSPITAL_BASED_OUTPATIENT_CLINIC_OR_DEPARTMENT_OTHER): Payer: BC Managed Care – PPO | Admitting: Hematology and Oncology

## 2019-08-27 DIAGNOSIS — D709 Neutropenia, unspecified: Secondary | ICD-10-CM

## 2019-08-27 DIAGNOSIS — R7989 Other specified abnormal findings of blood chemistry: Secondary | ICD-10-CM

## 2019-08-27 NOTE — Assessment & Plan Note (Signed)
Lab review:  04/07/2019: WBC 2.7, hemoglobin 12.3, MCV 100, platelets 213, ANC 0.8, ALC 1.5 08/10/2019: WBC 4.2, hemoglobin 12.2, MCV 101.9, platelets 242, ANC 2, ALC 1.9, folic acid 24, B12 2225 (previously was 137), ferritin 2336, iron saturation 102% 08/19/2019: Ferritin 2520, iron saturation 55%,  Hemochromatosis gene testing: Single mutation of H63D identified (most likely carrier) B12 supplementation dose reduced to 1000 mcg daily.  I recommended monitoring with every 3 month labs.

## 2019-08-28 ENCOUNTER — Telehealth: Payer: Self-pay | Admitting: Hematology and Oncology

## 2019-08-28 DIAGNOSIS — Z20822 Contact with and (suspected) exposure to covid-19: Secondary | ICD-10-CM | POA: Diagnosis not present

## 2019-08-28 NOTE — Telephone Encounter (Signed)
I talk with patient regarding schedule  

## 2019-09-01 ENCOUNTER — Encounter: Payer: Self-pay | Admitting: Hematology and Oncology

## 2019-09-01 ENCOUNTER — Encounter: Payer: Self-pay | Admitting: Family Medicine

## 2019-09-01 DIAGNOSIS — G43009 Migraine without aura, not intractable, without status migrainosus: Secondary | ICD-10-CM

## 2019-09-01 MED ORDER — SUMATRIPTAN SUCCINATE 50 MG PO TABS: 50.0000 mg | ORAL_TABLET | ORAL | 3 refills | Status: DC | PRN

## 2019-09-09 ENCOUNTER — Encounter: Payer: Self-pay | Admitting: Family Medicine

## 2019-09-09 DIAGNOSIS — N3000 Acute cystitis without hematuria: Secondary | ICD-10-CM

## 2019-09-10 MED ORDER — NITROFURANTOIN MONOHYD MACRO 100 MG PO CAPS: 100.0000 mg | ORAL_CAPSULE | Freq: Two times a day (BID) | ORAL | 0 refills | Status: DC

## 2019-09-18 ENCOUNTER — Telehealth: Payer: Self-pay | Admitting: Hematology and Oncology

## 2019-09-18 NOTE — Telephone Encounter (Signed)
Rescheduled per sch msg on 2/4. Called and spoke with pt, confirmed 2/15 appt

## 2019-09-18 NOTE — Telephone Encounter (Signed)
Returned patient's phone call regarding rescheduling an appointment, left a voicemail. 

## 2019-09-24 ENCOUNTER — Other Ambulatory Visit: Payer: Self-pay | Admitting: Family Medicine

## 2019-09-28 ENCOUNTER — Encounter: Payer: Self-pay | Admitting: Family Medicine

## 2019-09-28 ENCOUNTER — Inpatient Hospital Stay: Payer: BC Managed Care – PPO | Attending: Hematology and Oncology

## 2019-09-28 ENCOUNTER — Other Ambulatory Visit: Payer: Self-pay

## 2019-09-28 DIAGNOSIS — D72819 Decreased white blood cell count, unspecified: Secondary | ICD-10-CM | POA: Diagnosis not present

## 2019-09-28 MED ORDER — PANTOPRAZOLE SODIUM 40 MG PO TBEC: 40.0000 mg | DELAYED_RELEASE_TABLET | Freq: Every day | ORAL | 3 refills | Status: DC

## 2019-09-28 NOTE — Progress Notes (Signed)
First time Phlebotomy completed. R forarm used to collect 507g of blood via 16G needle. Pt. Did well. Pt. A+Ox3. Questions answered, nutrition given. Pt. Plans to wait . post observation time.

## 2019-09-28 NOTE — Patient Instructions (Signed)

## 2019-10-02 ENCOUNTER — Ambulatory Visit (INDEPENDENT_AMBULATORY_CARE_PROVIDER_SITE_OTHER): Payer: BC Managed Care – PPO

## 2019-10-02 DIAGNOSIS — Z3042 Encounter for surveillance of injectable contraceptive: Secondary | ICD-10-CM

## 2019-10-03 ENCOUNTER — Other Ambulatory Visit: Payer: Self-pay

## 2019-10-03 DIAGNOSIS — Z3042 Encounter for surveillance of injectable contraceptive: Secondary | ICD-10-CM | POA: Diagnosis not present

## 2019-10-03 LAB — POCT URINE PREGNANCY: Preg Test, Ur: NEGATIVE

## 2019-10-03 MED ORDER — MEDROXYPROGESTERONE ACETATE 150 MG/ML IM SUSP
150.0000 mg | Freq: Once | INTRAMUSCULAR | Status: AC
  Administered 2019-10-03: 10:00:00 150 mg via INTRAMUSCULAR

## 2019-10-03 NOTE — Progress Notes (Addendum)
Patient here today for depo injection. HCG was preformed due to patient being out of her window. 52mL depo given in right upper outer quadrant IM. Patient tolerated well.   I have reviewed the above note by Ms Dannielle Huh and agree. I also personally confirmed with pt that she had no unprotected intercourse in the last 2 weeks  J Copland MD

## 2019-10-12 ENCOUNTER — Encounter: Payer: Self-pay | Admitting: Hematology and Oncology

## 2019-10-13 ENCOUNTER — Telehealth: Payer: Self-pay | Admitting: Hematology and Oncology

## 2019-10-13 NOTE — Telephone Encounter (Signed)
Returned patient's phone call regarding rescheduling March appointments, per patient's request appointment has moved to April. Patient has sent providers nurse regarding the reason for the reschedule via My chart.

## 2019-10-21 ENCOUNTER — Encounter: Payer: Self-pay | Admitting: Family Medicine

## 2019-10-21 MED ORDER — TRAZODONE HCL 50 MG PO TABS: ORAL_TABLET | ORAL | 3 refills | Status: DC

## 2019-10-26 ENCOUNTER — Other Ambulatory Visit: Payer: BC Managed Care – PPO

## 2019-10-28 ENCOUNTER — Ambulatory Visit: Payer: BC Managed Care – PPO | Admitting: Hematology and Oncology

## 2019-11-20 ENCOUNTER — Encounter: Payer: Self-pay | Admitting: Family Medicine

## 2019-11-20 ENCOUNTER — Other Ambulatory Visit: Payer: Self-pay

## 2019-11-20 DIAGNOSIS — G43009 Migraine without aura, not intractable, without status migrainosus: Secondary | ICD-10-CM

## 2019-11-20 MED ORDER — SUMATRIPTAN SUCCINATE 50 MG PO TABS: 50.0000 mg | ORAL_TABLET | ORAL | 3 refills | Status: DC | PRN

## 2019-11-20 MED ORDER — GABAPENTIN 300 MG PO CAPS: 600.0000 mg | ORAL_CAPSULE | Freq: Four times a day (QID) | ORAL | 1 refills | Status: DC

## 2019-11-25 ENCOUNTER — Inpatient Hospital Stay: Payer: BC Managed Care – PPO | Attending: Hematology and Oncology

## 2019-11-25 ENCOUNTER — Other Ambulatory Visit: Payer: Self-pay

## 2019-11-25 DIAGNOSIS — H401112 Primary open-angle glaucoma, right eye, moderate stage: Secondary | ICD-10-CM | POA: Diagnosis not present

## 2019-11-25 DIAGNOSIS — D72819 Decreased white blood cell count, unspecified: Secondary | ICD-10-CM | POA: Insufficient documentation

## 2019-11-25 DIAGNOSIS — H401121 Primary open-angle glaucoma, left eye, mild stage: Secondary | ICD-10-CM | POA: Diagnosis not present

## 2019-11-25 DIAGNOSIS — D709 Neutropenia, unspecified: Secondary | ICD-10-CM

## 2019-11-25 DIAGNOSIS — R7989 Other specified abnormal findings of blood chemistry: Secondary | ICD-10-CM

## 2019-11-25 LAB — CMP (CANCER CENTER ONLY)
ALT: 28 U/L (ref 0–44)
AST: 38 U/L (ref 15–41)
Albumin: 4.1 g/dL (ref 3.5–5.0)
Alkaline Phosphatase: 51 U/L (ref 38–126)
Anion gap: 9 (ref 5–15)
BUN: 8 mg/dL (ref 6–20)
CO2: 24 mmol/L (ref 22–32)
Calcium: 9.3 mg/dL (ref 8.9–10.3)
Chloride: 108 mmol/L (ref 98–111)
Creatinine: 0.73 mg/dL (ref 0.44–1.00)
GFR, Est AFR Am: >60 mL/min (ref >60)
GFR, Estimated: >60 mL/min (ref >60)
Glucose, Bld: 168 mg/dL — ABNORMAL HIGH (ref 70–99)
Potassium: 3.9 mmol/L (ref 3.5–5.1)
Sodium: 141 mmol/L (ref 135–145)
Total Bilirubin: 0.2 mg/dL — ABNORMAL LOW (ref 0.3–1.2)
Total Protein: 7.4 g/dL (ref 6.5–8.1)

## 2019-11-25 LAB — CBC WITH DIFFERENTIAL (CANCER CENTER ONLY)
Abs Immature Granulocytes: 0 10*3/uL (ref 0.00–0.07)
Basophils Absolute: 0 10*3/uL (ref 0.0–0.1)
Basophils Relative: 0 %
Eosinophils Absolute: 0 10*3/uL (ref 0.0–0.5)
Eosinophils Relative: 1 %
HCT: 35.9 % — ABNORMAL LOW (ref 36.0–46.0)
Hemoglobin: 11.5 g/dL — ABNORMAL LOW (ref 12.0–15.0)
Immature Granulocytes: 0 %
Lymphocytes Relative: 45 %
Lymphs Abs: 1.9 10*3/uL (ref 0.7–4.0)
MCH: 31.3 pg (ref 26.0–34.0)
MCHC: 32 g/dL (ref 30.0–36.0)
MCV: 97.8 fL (ref 80.0–100.0)
Monocytes Absolute: 0.3 10*3/uL (ref 0.1–1.0)
Monocytes Relative: 8 %
Neutro Abs: 1.9 10*3/uL (ref 1.7–7.7)
Neutrophils Relative %: 46 %
Platelet Count: 214 10*3/uL (ref 150–400)
RBC: 3.67 MIL/uL — ABNORMAL LOW (ref 3.87–5.11)
RDW: 13.2 % (ref 11.5–15.5)
WBC Count: 4.1 10*3/uL (ref 4.0–10.5)
nRBC: 0 % (ref 0.0–0.2)

## 2019-11-26 LAB — IRON AND TIBC
Iron: 124 ug/dL (ref 41–142)
Saturation Ratios: 43 % (ref 21–57)
TIBC: 285 ug/dL (ref 236–444)
UIBC: 161 ug/dL (ref 120–384)

## 2019-11-26 LAB — FERRITIN: Ferritin: 702 ng/mL — ABNORMAL HIGH (ref 11–307)

## 2019-11-26 NOTE — Progress Notes (Signed)
  HEMATOLOGY-ONCOLOGY TELEPHONE VISIT PROGRESS NOTE  I connected with Lisa [name] on 11/27/2019 at 10:45 AM EDT by telephone and verified that I am speaking with the correct person using two identifiers.  I discussed the limitations, risks, security and privacy concerns of performing an evaluation and management service by telephone and the availability of in person appointments.  I also discussed with the patient that there may be a patient responsible charge related to this service. The patient expressed understanding and agreed to proceed.   History of Present Illness: Lisa [name] is a 49 y.o. female with above-mentioned history of leukocytopenia, folic acid deficiency, and F-81 deficiency. She underwent a phlebotomy on 09/28/19. Labs on 11/25/19 showed Hg 11.5, HCT 35.9, iron saturation 43%, ferritin 702. She is over the phone today to review her labs. She is tolerating the phlebotomies very well.  Observations/Objective:  We decided to do phlebotomies every 2 months at the [facility].   Assessment Plan:  Leukocytopenia Lab review:  04/07/2019: WBC 2.7, hemoglobin 12.3, MCV 100, platelets 213, ANC 0.8, ALC 1.5 08/10/2019: WBC 4.2,hemoglobin 12.2, MCV 101.9, platelets 242, ANC 2, ALC 1.9, folic acid 24, B12 2225 (previously was 137), ferritin 2336, iron saturation 102% 08/19/2019: Ferritin 2520, iron saturation 55%,  04/07/2019: AST 147 ALT 80 11/25/2019: Ferritin 702 (came down from 2520), iron saturation 43%, AST 38, ALT 28 (decreased from 147 and 80 respectively), WBC 4.1, hemoglobin 11.5  Hemochromatosis gene testing: Single mutation of H63D identified (most likely carrier) Current treatment: Phlebotomy through [facility] (Feb and March)  I discussed with her that she has had an excellent response to phlebotomy.  I would recommend continuation of phlebotomy every 2 months until the ferritin comes down below 100.  We will check in with her in 6 months with labs done ahead of time.  We  can do a phone  visit to follow-up on the results.   I discussed the assessment and treatment plan with the patient. The patient was provided an opportunity to ask questions and all were answered. The patient agreed with the plan and demonstrated an understanding of the instructions. The patient was advised to call back or seek an in-person evaluation if the symptoms worsen or if the condition fails to improve as anticipated.   I provided 12 minutes of non-face-to-face time during this encounter.   Sabas Sous, MD 11/27/2019    I, Molly Dorshimer, am acting as scribe for Serena Croissant, MD.  I have reviewed the above documentation for accuracy and completeness, and I agree with the above.

## 2019-11-27 ENCOUNTER — Inpatient Hospital Stay (HOSPITAL_BASED_OUTPATIENT_CLINIC_OR_DEPARTMENT_OTHER): Payer: BC Managed Care – PPO | Admitting: Hematology and Oncology

## 2019-11-27 DIAGNOSIS — D709 Neutropenia, unspecified: Secondary | ICD-10-CM

## 2019-11-27 NOTE — Assessment & Plan Note (Addendum)
Lab review:  04/07/2019: WBC 2.7, hemoglobin 12.3, MCV 100, platelets 213, ANC 0.8, ALC 1.5 08/10/2019: WBC 4.2,hemoglobin 12.2, MCV 101.9, platelets 242, ANC 2, ALC 1.9, folic acid 24, B12 2225 (previously was 137), ferritin 2336, iron saturation 102% 08/19/2019: Ferritin 2520, iron saturation 55%,  04/07/2019: AST 147 ALT 80 11/25/2019: Ferritin 702 (came down from 2520), iron saturation 43%, AST 38, ALT 28 (decreased from 147 and 80 respectively), WBC 4.1, hemoglobin 11.5  Hemochromatosis gene testing: Single mutation of H63D identified (most likely carrier) Current treatment: Phlebotomy through [facility] every 2 months  I discussed with her that she has had an excellent response to phlebotomy.  I would recommend continuation of phlebotomy every 2 months until the ferritin comes down below 100.  We will check in with her in 6 months with labs done ahead of time.  We can do a virtual visit to follow-up on the results.

## 2019-12-20 NOTE — Progress Notes (Deleted)
[facility] [address] [place], [place] [address] [phone]  Date:  12/21/2019   Name:  Lisa [name]   DOB:  [birth year]   MRN:  [id number]  PCP:  Darreld Mclean, MD    Chief Complaint: No chief complaint on file.   History of Present Illness:  Lisa [name] is a 49 y.o. very pleasant female patient who presents with the following:  Patient here today for a follow-up visit and Depo-Provera injection History of prediabetes, hyperlipidemia, hypertension, hemochromatosis carrier, history of hip replacement We have communicated via MyChart, but I have actually not seen her in a little over a year. At that time she was having an issue with her right second toe, which was extremely problematic She was finally able to get her toe pain under control with gabapentin  We referred her to hematology last year due to leukopenia.  This issue resolved with B12 supplementation, but she is found to have elevated LFTs and high iron She has history of hemochromatosis single mutation, most likely a carrier.  She is doing phlebotomy at the [facility] every 2 months At last blood testing in April, her LFTs were back to normal  COVID-19 vaccine Pap Mammogram is up-to-date Colonoscopy up-to-date  Her most recent Depo-Provera dose was February 19  Patient Active Problem List   Diagnosis Date Noted  . Elevated ferritin 08/27/2019  . Pain in right toe(s) 04/07/2019  . Fecal incontinence 01/15/2018  . Pre-diabetes 02/14/2017  . Left anterior cruciate ligament tear 08/03/2016  . Acute medial meniscus tear of left knee 08/03/2016  . Acute lateral meniscus tear of left knee 08/03/2016  . History of abnormal cervical Pap smear 09/21/2013  . Leukocytopenia 03/02/2013  . S/P hip replacement 02/06/2013  . Hyperlipidemia 10/13/2012  . HTN (hypertension) 10/13/2012    Past Medical History:  Diagnosis Date  . Anemia    hx of  .  Anxiety 20 years ago  . Depression 20 years ago  . GERD (gastroesophageal reflux disease)   . Heart murmur   . Hyperlipidemia   . Hypertension    history of htn, no meds now  . Ulcer    x2  . Vaginal Pap smear, abnormal     Past Surgical History:  Procedure Laterality Date  . ANTERIOR CRUCIATE LIGAMENT REPAIR Left 09/04/2016   Procedure: LEFT KNEE MEDIAL COLLATERAL LIGAMENT REPAIR, ANTERIOR CRUCIATE LIGAMENT RECONSTRUCTION, MENISCAL DEBRIDEMENT VERSUS REPAIR;  Surgeon: Meredith Pel, MD;  Location: [facility];  Service: Orthopedics;  Laterality: Left;  . ESOPHAGOGASTRODUODENOSCOPY ENDOSCOPY     at least 4  . LAPAROSCOPIC APPENDECTOMY N/A 02/23/2015   Procedure: APPENDECTOMY LAPAROSCOPIC;  Surgeon: Greer Pickerel, MD;  Location: [facility];  Service: General;  Laterality: N/A;  . TOTAL HIP ARTHROPLASTY Left 02/06/2013   Procedure: LEFT TOTAL HIP ARTHROPLASTY ANTERIOR APPROACH;  Surgeon: Mcarthur Rossetti, MD;  Location: [facility];  Service: Orthopedics;  Laterality: Left;    Social History   Tobacco Use  . Smoking status: Former Smoker    Packs/day: 0.25    Years: 20.00    Pack years: 5.00    Types: Cigarettes    Quit date: 08/13/2013    Years since quitting: 6.3  . Smokeless tobacco: Never Used  Substance Use Topics  . Alcohol use: Yes    Comment: few glasses of wine a week  . Drug use: No    Family History  Adopted:  Yes    Allergies  Allergen Reactions  . Nsaids Other (See Comments)    ulcers  . Latex     Surgical tape not bandaid   . Nitroglycerin     Pt took Nitroglycerin patches - pt stated, "Made my skin bright red when I changed my patch; it made my foot itch and burn"    Medication list has been reviewed and updated.  Current Outpatient Medications on File Prior to Visit  Medication Sig Dispense Refill  . diphenoxylate-atropine (LOMOTIL) 2.5-0.025 MG tablet TAKE 1 TABLET BY MOUTH 4 (FOUR) TIMES DAILY AS NEEDED FOR DIARRHEA OR LOOSE STOOLS. 120 tablet 2  .  gabapentin (NEURONTIN) 300 MG capsule Take 2 capsules (600 mg total) by mouth 4 (four) times daily. 720 capsule 1  . Lidocaine (HM LIDOCAINE PATCH) 4 % PTCH Apply 1 patch topically daily as needed. 10 patch 11  . lisinopril (ZESTRIL) 10 MG tablet Take 1 tablet (10 mg total) by mouth daily. 90 tablet 3  . MAGNESIUM PO Take 1 tablet by mouth daily.    . medroxyPROGESTERone (DEPO-PROVERA) 150 MG/ML injection Inject 1 mL (150 mg total) into the muscle once. 1 mL 0  . metoCLOPramide (REGLAN) 10 MG tablet Take 1 tablet (10 mg total) by mouth every 6 (six) hours as needed for nausea (or headache). 30 tablet 0  . nitrofurantoin, macrocrystal-monohydrate, (MACROBID) 100 MG capsule Take 1 capsule (100 mg total) by mouth 2 (two) times daily. 14 capsule 0  . ondansetron (ZOFRAN) 4 MG tablet Take 1 tablet (4 mg total) by mouth every 8 (eight) hours as needed for nausea or vomiting. 30 tablet 0  . pantoprazole (PROTONIX) 40 MG tablet Take 1 tablet (40 mg total) by mouth daily. 90 tablet 3  . POTASSIUM PO Take 1 tablet by mouth daily.    . simvastatin (ZOCOR) 20 MG tablet Take 1 tablet (20 mg total) by mouth every evening. 90 tablet 3  . SUMAtriptan (IMITREX) 50 MG tablet Take 1 tablet (50 mg total) by mouth every 2 (two) hours as needed for migraine. Max 100 mg in 24 hours 10 tablet 3  . traMADol (ULTRAM) 50 MG tablet Take 1 tablet (50 mg total) by mouth 3 (three) times daily. 90 tablet 1  . Travoprost, BAK Free, (TRAVATAN Z) 0.004 % SOLN ophthalmic solution     . traZODone (DESYREL) 50 MG tablet TAKE 1/2 TO 1 TABLET BY MOUTH AT BEDTIME AS NEEDED FOR SLEEP. MAY TAKE 2 AS NEEDED 180 tablet 3  . Vitamin D, Ergocalciferol, (DRISDOL) 1.25 MG (50000 UT) CAPS capsule Take 1 capsule (50,000 Units total) by mouth every 7 (seven) days. 8 capsule 0   No current facility-administered medications on file prior to visit.    Review of Systems:  As per HPI- otherwise negative.   Physical Examination: There were no  vitals filed for this visit. There were no vitals filed for this visit. There is no height or weight on file to calculate BMI. Ideal Body Weight:    GEN: no acute distress. HEENT: Atraumatic, Normocephalic.  Ears and Nose: No external deformity. CV: RRR, No M/G/R. No JVD. No thrill. No extra heart sounds. PULM: CTA B, no wheezes, crackles, rhonchi. No retractions. No resp. distress. No accessory muscle use. ABD: S, NT, ND, +BS. No rebound. No HSM. EXTR: No c/c/e PSYCH: Normally interactive. Conversant.    Assessment and Plan: *** This visit occurred during the SARS-CoV-2 public health emergency.  Safety protocols were in place, including  screening questions prior to the visit, additional usage of staff PPE, and extensive cleaning of exam room while observing appropriate contact time as indicated for disinfecting solutions.    Signed Lamar Blinks, MD

## 2019-12-21 ENCOUNTER — Ambulatory Visit: Payer: BC Managed Care – PPO | Admitting: Family Medicine

## 2019-12-21 ENCOUNTER — Ambulatory Visit (INDEPENDENT_AMBULATORY_CARE_PROVIDER_SITE_OTHER): Payer: BC Managed Care – PPO | Admitting: *Deleted

## 2019-12-21 ENCOUNTER — Other Ambulatory Visit: Payer: Self-pay

## 2019-12-21 DIAGNOSIS — Z3042 Encounter for surveillance of injectable contraceptive: Secondary | ICD-10-CM

## 2019-12-21 MED ORDER — MEDROXYPROGESTERONE ACETATE 150 MG/ML IM SUSP
150.0000 mg | Freq: Once | INTRAMUSCULAR | Status: AC
  Administered 2019-12-21: 16:00:00 150 mg via INTRAMUSCULAR

## 2019-12-21 NOTE — Progress Notes (Signed)
Patient here today for depo provera injection. Patient in window.  Injection given and patient tolerated well.  Patient given depo provera calendar and she will call to make next appointment.  Patient due 03/07/20-03/21/20

## 2020-01-22 ENCOUNTER — Encounter: Payer: Self-pay | Admitting: Family Medicine

## 2020-01-23 ENCOUNTER — Encounter: Payer: Self-pay | Admitting: Family Medicine

## 2020-01-25 ENCOUNTER — Encounter: Payer: Self-pay | Admitting: Family Medicine

## 2020-02-16 ENCOUNTER — Ambulatory Visit: Payer: BC Managed Care – PPO

## 2020-03-09 ENCOUNTER — Ambulatory Visit (INDEPENDENT_AMBULATORY_CARE_PROVIDER_SITE_OTHER): Payer: BC Managed Care – PPO

## 2020-03-09 ENCOUNTER — Other Ambulatory Visit: Payer: Self-pay

## 2020-03-09 DIAGNOSIS — Z3042 Encounter for surveillance of injectable contraceptive: Secondary | ICD-10-CM

## 2020-03-09 MED ORDER — MEDROXYPROGESTERONE ACETATE 150 MG/ML IM SUSP
150.0000 mg | Freq: Once | INTRAMUSCULAR | Status: AC
  Administered 2020-03-09: 150 mg via INTRAMUSCULAR

## 2020-03-09 NOTE — Progress Notes (Addendum)
Patient here today for Depo-Provera. 82mL given in right upper outer quadrant. Patient tolerated well.   Willow Ora, MD

## 2020-05-25 ENCOUNTER — Inpatient Hospital Stay: Payer: BC Managed Care – PPO

## 2020-05-27 ENCOUNTER — Inpatient Hospital Stay: Payer: BC Managed Care – PPO | Admitting: Hematology and Oncology

## 2020-06-01 ENCOUNTER — Ambulatory Visit (INDEPENDENT_AMBULATORY_CARE_PROVIDER_SITE_OTHER): Payer: BC Managed Care – PPO | Admitting: *Deleted

## 2020-06-01 ENCOUNTER — Other Ambulatory Visit: Payer: Self-pay

## 2020-06-01 DIAGNOSIS — Z3042 Encounter for surveillance of injectable contraceptive: Secondary | ICD-10-CM | POA: Diagnosis not present

## 2020-06-01 MED ORDER — MEDROXYPROGESTERONE ACETATE 150 MG/ML IM SUSP
150.0000 mg | Freq: Once | INTRAMUSCULAR | Status: AC
  Administered 2020-06-01: 150 mg via INTRAMUSCULAR

## 2020-06-01 NOTE — Progress Notes (Signed)
Patient here today for Depo-Provera. 85mL given in left upper outer quadrarant. Patient tolerated well.  Pt next injection due 08/17/2020-08/31/20 Pt will call for next appt.

## 2020-06-03 ENCOUNTER — Telehealth: Payer: Self-pay | Admitting: Hematology and Oncology

## 2020-06-03 NOTE — Telephone Encounter (Signed)
Cancelled appointments per 10/22 sch msg. Called patient, no answer. Left message for patient to call back if they would like to reschedule.

## 2020-06-15 ENCOUNTER — Inpatient Hospital Stay: Payer: BC Managed Care – PPO | Attending: Hematology and Oncology

## 2020-06-22 ENCOUNTER — Inpatient Hospital Stay: Payer: BC Managed Care – PPO | Admitting: Hematology and Oncology

## 2020-06-22 DIAGNOSIS — H401112 Primary open-angle glaucoma, right eye, moderate stage: Secondary | ICD-10-CM | POA: Diagnosis not present

## 2020-06-22 DIAGNOSIS — H401121 Primary open-angle glaucoma, left eye, mild stage: Secondary | ICD-10-CM | POA: Diagnosis not present

## 2020-06-22 DIAGNOSIS — H5213 Myopia, bilateral: Secondary | ICD-10-CM | POA: Diagnosis not present

## 2020-06-22 DIAGNOSIS — H52223 Regular astigmatism, bilateral: Secondary | ICD-10-CM | POA: Diagnosis not present

## 2020-06-22 DIAGNOSIS — H524 Presbyopia: Secondary | ICD-10-CM | POA: Diagnosis not present

## 2020-06-22 NOTE — Assessment & Plan Note (Deleted)
Lab review:  04/07/2019: WBC 2.7, hemoglobin 12.3, MCV 100, platelets 213, ANC 0.8, ALC 1.5 08/10/2019: WBC 4.2,hemoglobin 12.2, MCV 101.9, platelets 242, ANC 2, ALC 1.9, folic acid 24, B12 2225 (previously was 137), ferritin 2336, iron saturation 102% 08/19/2019: Ferritin 2520, iron saturation 55%, 04/07/2019: AST 147 ALT 80 11/25/2019: Ferritin 702 (came down from 2520), iron saturation 43%, AST 38, ALT 28 (decreased from 147 and 80 respectively), WBC 4.1, hemoglobin 11.5 06/22/2020:   Hemochromatosis gene testing: Single mutation of H63D identified (most likely carrier) Current treatment: Phlebotomy through [facility] (Feb and March) We will call her with the results of the iron studies done today.  Return to clinic every 6 months with labs and follow-up labs will be ideally done ahead of time.

## 2020-06-27 ENCOUNTER — Other Ambulatory Visit: Payer: Self-pay | Admitting: Family Medicine

## 2020-06-27 DIAGNOSIS — G43009 Migraine without aura, not intractable, without status migrainosus: Secondary | ICD-10-CM

## 2020-06-29 ENCOUNTER — Other Ambulatory Visit: Payer: Self-pay | Admitting: Family Medicine

## 2020-06-29 DIAGNOSIS — E7849 Other hyperlipidemia: Secondary | ICD-10-CM

## 2020-07-02 ENCOUNTER — Other Ambulatory Visit: Payer: Self-pay | Admitting: Family Medicine

## 2020-07-02 DIAGNOSIS — E7849 Other hyperlipidemia: Secondary | ICD-10-CM

## 2020-07-28 ENCOUNTER — Other Ambulatory Visit: Payer: Self-pay | Admitting: Family Medicine

## 2020-07-28 DIAGNOSIS — I10 Essential (primary) hypertension: Secondary | ICD-10-CM

## 2020-08-29 ENCOUNTER — Other Ambulatory Visit: Payer: Self-pay | Admitting: Family Medicine

## 2020-08-29 DIAGNOSIS — I10 Essential (primary) hypertension: Secondary | ICD-10-CM

## 2020-08-31 ENCOUNTER — Ambulatory Visit (INDEPENDENT_AMBULATORY_CARE_PROVIDER_SITE_OTHER): Payer: BC Managed Care – PPO

## 2020-08-31 ENCOUNTER — Other Ambulatory Visit: Payer: Self-pay

## 2020-08-31 DIAGNOSIS — Z3042 Encounter for surveillance of injectable contraceptive: Secondary | ICD-10-CM | POA: Diagnosis not present

## 2020-08-31 MED ORDER — MEDROXYPROGESTERONE ACETATE 150 MG/ML IM SUSP
150.0000 mg | Freq: Once | INTRAMUSCULAR | Status: AC
  Administered 2020-08-31: 150 mg via INTRAMUSCULAR

## 2020-08-31 NOTE — Progress Notes (Signed)
Lisa [name] is a 50 y.o. female presents to the office today for Depoprovera injection, per physician's orders. Original order: Lisa [name] Depo provera given in Left upper outer quadrant.  Patient tolerated injection. Patient due for follow up labs/provider appt: yes  Pt is aware that she is due to f/u with Lisa [name] now. And  Will set this up. Patient next injection due: April 6-MayMay 4 2022. Pt will call to set up appointment.   [name], Lisa Harts

## 2020-09-10 ENCOUNTER — Other Ambulatory Visit: Payer: Self-pay | Admitting: Family Medicine

## 2020-09-10 DIAGNOSIS — I10 Essential (primary) hypertension: Secondary | ICD-10-CM

## 2020-09-25 ENCOUNTER — Other Ambulatory Visit: Payer: Self-pay | Admitting: Family Medicine

## 2020-09-25 DIAGNOSIS — E7849 Other hyperlipidemia: Secondary | ICD-10-CM

## 2020-09-28 ENCOUNTER — Ambulatory Visit: Payer: BC Managed Care – PPO | Admitting: Orthopedic Surgery

## 2020-09-28 ENCOUNTER — Encounter: Payer: Self-pay | Admitting: Family Medicine

## 2020-09-28 ENCOUNTER — Encounter: Payer: Self-pay | Admitting: Orthopedic Surgery

## 2020-09-28 ENCOUNTER — Ambulatory Visit: Payer: Self-pay

## 2020-09-28 ENCOUNTER — Other Ambulatory Visit: Payer: Self-pay | Admitting: Family Medicine

## 2020-09-28 DIAGNOSIS — I10 Essential (primary) hypertension: Secondary | ICD-10-CM

## 2020-09-28 DIAGNOSIS — M25551 Pain in right hip: Secondary | ICD-10-CM | POA: Diagnosis not present

## 2020-09-28 DIAGNOSIS — M87051 Idiopathic aseptic necrosis of right femur: Secondary | ICD-10-CM | POA: Diagnosis not present

## 2020-09-28 DIAGNOSIS — M87052 Idiopathic aseptic necrosis of left femur: Secondary | ICD-10-CM

## 2020-09-28 MED ORDER — LISINOPRIL 10 MG PO TABS: 10.0000 mg | ORAL_TABLET | Freq: Every day | ORAL | 1 refills | Status: DC

## 2020-09-28 NOTE — Progress Notes (Signed)
Office Visit Note   Patient: Lisa [name]           Date of Birth: February 28, 1971           MRN: [id number] Visit Date: 09/28/2020 Requested by: Pearline Cables, MD [address] [place],  [place] [address] PCP: Pearline Cables, MD  Subjective: Chief Complaint  Patient presents with  . Right Hip - Hip Pain    Patient was told that she has avascular necrosis and would possibly need hip surgery.     HPI: Lisa [name] is a 50 y.o. female who presents to the office complaining of right hip pain.  Patient reports right hip pain in the last 2 weeks.  It came on suddenly 2 weeks ago with 8/10 pain that she localizes to her groin with radiation into the lateral hip and down into the anterior mid thigh.  She has history of avascular necrosis of both hips, having had a left total hip arthroplasty by Dr. Magnus Ivan due to AVN.  She notes that pain is improving overall over the last several weeks.  Pain is worse with weightbearing.  Originally it did wake her up from sleep but not recently.  She has no radiation of pain past her knee.  No numbness or tingling.  No weakness of the leg.  She has a stimulator device implanted which prevents her from getting MRI scans.  Her job mostly consists of desk work..                ROS: All systems reviewed are negative as they relate to the chief complaint within the history of present illness.  Patient denies fevers or chills.  Assessment & Plan: Visit Diagnoses:  1. Pain in right hip   2. Avascular necrosis of bones of both hips (HCC)     Plan: Patient is a 50 year old female presents complaining of right hip pain.  She has had increased right hip pain over the last 2 weeks.  This pain is steadily improving fortunately.  She has a history of a vast necrosis of both hips and had left hip replacement by Dr. Magnus Ivan several years ago.  She has some early findings on radiographs today concerning for right hip AVN but no significant collapse noted.   She cannot have MRI scans due to the nerve stimulator she has implanted.  She is symptomatic on exam today with manipulation of the hip joint but no intervention required at this time with patient steadily improving.  Plan to order CT scan of the right hip for further evaluation of avascular necrosis when her symptoms return and persist.  Follow-up as needed.  CT scan would not be as helpful as the MRI scan would but limited due to patient's implant.  CT scan will show degree of humeral head subchondral collapse but not great at identifying the more subtle findings of early AVN.  Follow-Up Instructions: No follow-ups on file.   Orders:  Orders Placed This Encounter  Procedures  . XR HIP UNILAT W OR W/O PELVIS 2-3 VIEWS RIGHT   No orders of the defined types were placed in this encounter.     Procedures: No procedures performed   Clinical Data: No additional findings.  Objective: Vital Signs: There were no vitals taken for this visit.  Physical Exam:  Constitutional: Patient appears well-developed HEENT:  Head: Normocephalic Eyes:EOM are normal Neck: Normal range of motion Cardiovascular: Normal rate Pulmonary/chest: Effort normal Neurologic: Patient is  alert Skin: Skin is warm Psychiatric: Patient has normal mood and affect  Ortho Exam: Ortho exam demonstrates right hip with pain with terminal hip flexion and internal rotation.  Tenderness over the greater trochanter of the right femur.  Positive Stinchfield sign with pain localized to the right groin.  Negative straight leg raise.  Excellent 5/5 motor strength of bilateral hip flexors, quadricep, hamstring, dorsiflexion, plantarflexion.  Specialty Comments:  No specialty comments available.  Imaging: No results found.   PMFS History: Patient Active Problem List   Diagnosis Date Noted  . Elevated ferritin 08/27/2019  . Pain in right toe(s) 04/07/2019  . Fecal incontinence 01/15/2018  . Pre-diabetes 02/14/2017  .  Left anterior cruciate ligament tear 08/03/2016  . Acute medial meniscus tear of left knee 08/03/2016  . Acute lateral meniscus tear of left knee 08/03/2016  . History of abnormal cervical Pap smear 09/21/2013  . Leukocytopenia 03/02/2013  . S/P hip replacement 02/06/2013  . Hyperlipidemia 10/13/2012  . HTN (hypertension) 10/13/2012   Past Medical History:  Diagnosis Date  . Anemia    hx of  . Anxiety 20 years ago  . Depression 20 years ago  . GERD (gastroesophageal reflux disease)   . Heart murmur   . Hyperlipidemia   . Hypertension    history of htn, no meds now  . Ulcer    x2  . Vaginal Pap smear, abnormal     Family History  Adopted: Yes    Past Surgical History:  Procedure Laterality Date  . ANTERIOR CRUCIATE LIGAMENT REPAIR Left 09/04/2016   Procedure: LEFT KNEE MEDIAL COLLATERAL LIGAMENT REPAIR, ANTERIOR CRUCIATE LIGAMENT RECONSTRUCTION, MENISCAL DEBRIDEMENT VERSUS REPAIR;  Surgeon: Cammy Copa, MD;  Location: [facility];  Service: Orthopedics;  Laterality: Left;  . ESOPHAGOGASTRODUODENOSCOPY ENDOSCOPY     at least 4  . LAPAROSCOPIC APPENDECTOMY N/A 02/23/2015   Procedure: APPENDECTOMY LAPAROSCOPIC;  Surgeon: Gaynelle Adu, MD;  Location: [facility];  Service: General;  Laterality: N/A;  . TOTAL HIP ARTHROPLASTY Left 02/06/2013   Procedure: LEFT TOTAL HIP ARTHROPLASTY ANTERIOR APPROACH;  Surgeon: Kathryne Hitch, MD;  Location: [facility];  Service: Orthopedics;  Laterality: Left;   Social History   Occupational History  . Not on file  Tobacco Use  . Smoking status: Former Smoker    Packs/day: 0.25    Years: 20.00    Pack years: 5.00    Types: Cigarettes    Quit date: 08/13/2013    Years since quitting: 7.1  . Smokeless tobacco: Never Used  Vaping Use  . Vaping Use: Never used  Substance and Sexual Activity  . Alcohol use: Yes    Comment: few glasses of wine a week  . Drug use: No  . Sexual activity: Not on file

## 2020-09-29 ENCOUNTER — Other Ambulatory Visit: Payer: Self-pay | Admitting: Family Medicine

## 2020-10-01 ENCOUNTER — Encounter: Payer: Self-pay | Admitting: Orthopedic Surgery

## 2020-10-30 ENCOUNTER — Other Ambulatory Visit: Payer: Self-pay | Admitting: Family Medicine

## 2020-11-01 NOTE — Patient Instructions (Addendum)
It was great to see you again today, I will be in touch with your labs soon as possible We will get a pap for you today     Health Maintenance, Female Adopting a healthy lifestyle and getting preventive care are important in promoting health and wellness. Ask your health care provider about:  The right schedule for you to have regular tests and exams.  Things you can do on your own to prevent diseases and keep yourself healthy. What should I know about diet, weight, and exercise? Eat a healthy diet  Eat a diet that includes plenty of vegetables, fruits, low-fat dairy products, and lean protein.  Do not eat a lot of foods that are high in solid fats, added sugars, or sodium.   Maintain a healthy weight Body mass index (BMI) is used to identify weight problems. It estimates body fat based on height and weight. Your health care provider can help determine your BMI and help you achieve or maintain a healthy weight. Get regular exercise Get regular exercise. This is one of the most important things you can do for your health. Most adults should:  Exercise for at least 150 minutes each week. The exercise should increase your heart rate and make you sweat (moderate-intensity exercise).  Do strengthening exercises at least twice a week. This is in addition to the moderate-intensity exercise.  Spend less time sitting. Even light physical activity can be beneficial. Watch cholesterol and blood lipids Have your blood tested for lipids and cholesterol at 47 years of age, then have this test every 5 years. Have your cholesterol levels checked more often if:  Your lipid or cholesterol levels are high.  You are older than 48 years of age.  You are at high risk for heart disease. What should I know about cancer screening? Depending on your health history and family history, you may need to have cancer screening at various ages. This may include screening for:  Breast cancer.  Cervical  cancer.  Colorectal cancer.  Skin cancer.  Lung cancer. What should I know about heart disease, diabetes, and high blood pressure? Blood pressure and heart disease  High blood pressure causes heart disease and increases the risk of stroke. This is more likely to develop in people who have high blood pressure readings, are of African descent, or are overweight.  Have your blood pressure checked: ? Every 3-5 years if you are 71-73 years of age. ? Every year if you are 19 years old or older. Diabetes Have regular diabetes screenings. This checks your fasting blood sugar level. Have the screening done:  Once every three years after age 37 if you are at a normal weight and have a low risk for diabetes.  More often and at a younger age if you are overweight or have a high risk for diabetes. What should I know about preventing infection? Hepatitis B If you have a higher risk for hepatitis B, you should be screened for this virus. Talk with your health care provider to find out if you are at risk for hepatitis B infection. Hepatitis C Testing is recommended for:  Everyone born from [birth year] through 1963.  Anyone with known risk factors for hepatitis C. Sexually transmitted infections (STIs)  Get screened for STIs, including gonorrhea and chlamydia, if: ? You are sexually active and are younger than 48 years of age. ? You are older than 48 years of age and your health care provider tells you that you are at risk  for this type of infection. ? Your sexual activity has changed since you were last screened, and you are at increased risk for chlamydia or gonorrhea. Ask your health care provider if you are at risk.  Ask your health care provider about whether you are at high risk for HIV. Your health care provider may recommend a prescription medicine to help prevent HIV infection. If you choose to take medicine to prevent HIV, you should first get tested for HIV. You should then be tested every 3  months for as long as you are taking the medicine. Pregnancy  If you are about to stop having your period (premenopausal) and you may become pregnant, seek counseling before you get pregnant.  Take 400 to 800 micrograms (mcg) of folic acid every day if you become pregnant.  Ask for birth control (contraception) if you want to prevent pregnancy. Osteoporosis and menopause Osteoporosis is a disease in which the bones lose minerals and strength with aging. This can result in bone fractures. If you are 68 years old or older, or if you are at risk for osteoporosis and fractures, ask your health care provider if you should:  Be screened for bone loss.  Take a calcium or vitamin D supplement to lower your risk of fractures.  Be given hormone replacement therapy (HRT) to treat symptoms of menopause. Follow these instructions at home: Lifestyle  Do not use any products that contain nicotine or tobacco, such as cigarettes, e-cigarettes, and chewing tobacco. If you need help quitting, ask your health care provider.  Do not use street drugs.  Do not share needles.  Ask your health care provider for help if you need support or information about quitting drugs. Alcohol use  Do not drink alcohol if: ? Your health care provider tells you not to drink. ? You are pregnant, may be pregnant, or are planning to become pregnant.  If you drink alcohol: ? Limit how much you use to 0-1 drink a day. ? Limit intake if you are breastfeeding.  Be aware of how much alcohol is in your drink. In the [place] one drink equals one 12 oz bottle of beer (355 mL), one 5 oz glass of wine (148 mL), or one 1 oz glass of hard liquor (44 mL). General instructions  Schedule regular health, dental, and eye exams.  Stay current with your vaccines.  Tell your health care provider if: ? You often feel depressed. ? You have ever been abused or do not feel safe at home. Summary  Adopting a healthy lifestyle and getting  preventive care are important in promoting health and wellness.  Follow your health care provider's instructions about healthy diet, exercising, and getting tested or screened for diseases.  Follow your health care provider's instructions on monitoring your cholesterol and blood pressure. This information is not intended to replace advice given to you by your health care provider. Make sure you discuss any questions you have with your health care provider. Document Revised: 07/23/2018 Document Reviewed: 07/23/2018 Elsevier Patient Education  2021 [employer].

## 2020-11-01 NOTE — Progress Notes (Addendum)
[facility] [address] [place], [place] [address] [phone]  Date:  11/03/2020   Name:  Lisa [name]   DOB:  05-18-1971   MRN:  [id number]  PCP:  Pearline Cables, MD    Chief Complaint: Annual Exam   History of Present Illness:  Lisa [name] is a 50 y.o. very pleasant female patient who presents with the following:  Here today for physical exam Last seen by myself in May 2020, although we have communicated multiple times since then Pt with history of fecal incontinence now with sacral neuromodulator, HTN, hyperlipidemia, pre-diabetes, left hip replacement due to AVN, hemochromatosis managed by hematology, B12 deficiency Long term use of depo-provera.  2 years ago she had an issue with "blue toe syndrome" -chronically painful toe which has been evaluated by vascular surgery She was also seen by hematology-they put her on a B12 supplement and this finally resolved  She saw Dorene Grebe with orthopedics last month with right hip pain-she has history of AVN of both hips and had a left hip replacement previously Dr. August Saucer recommended a CT scan of her right hip I do not see where this has been completed as of yet. She is not able to do MRI due to her sacral neuromodulator  COVID-19 series complete-patient did have a significant reaction to her first vaccine Pap is due-she had an ASCUS Pap with positive HPV back in 2018, I referred her to GYN She had colpo per Dr. Erin Fulling in August 2018.  Her couple biopsies were negative, asked to repeat Pap in 1 year No follow up done as of yet   Flu vaccine- done in the fall  Mammogram due- she will call and schedule with [facility]  Routine labs due today  Patient Active Problem List   Diagnosis Date Noted  . Elevated ferritin 08/27/2019  . Pain in right toe(s) 04/07/2019  . Fecal incontinence 01/15/2018  . Pre-diabetes 02/14/2017  . Left anterior cruciate ligament tear  08/03/2016  . Acute medial meniscus tear of left knee 08/03/2016  . Acute lateral meniscus tear of left knee 08/03/2016  . History of abnormal cervical Pap smear 09/21/2013  . Leukocytopenia 03/02/2013  . S/P hip replacement 02/06/2013  . Hyperlipidemia 10/13/2012  . HTN (hypertension) 10/13/2012    Past Medical History:  Diagnosis Date  . Anemia    hx of  . Anxiety 20 years ago  . Depression 20 years ago  . GERD (gastroesophageal reflux disease)   . Heart murmur   . Hyperlipidemia   . Hypertension    history of htn, no meds now  . Ulcer    x2  . Vaginal Pap smear, abnormal     Past Surgical History:  Procedure Laterality Date  . ANTERIOR CRUCIATE LIGAMENT REPAIR Left 09/04/2016   Procedure: LEFT KNEE MEDIAL COLLATERAL LIGAMENT REPAIR, ANTERIOR CRUCIATE LIGAMENT RECONSTRUCTION, MENISCAL DEBRIDEMENT VERSUS REPAIR;  Surgeon: Cammy Copa, MD;  Location: [facility];  Service: Orthopedics;  Laterality: Left;  . ESOPHAGOGASTRODUODENOSCOPY ENDOSCOPY     at least 4  . LAPAROSCOPIC APPENDECTOMY N/A 02/23/2015   Procedure: APPENDECTOMY LAPAROSCOPIC;  Surgeon: Gaynelle Adu, MD;  Location: [facility];  Service: General;  Laterality: N/A;  . TOTAL HIP ARTHROPLASTY Left 02/06/2013   Procedure: LEFT TOTAL HIP ARTHROPLASTY ANTERIOR APPROACH;  Surgeon: Kathryne Hitch, MD;  Location: [facility];  Service: Orthopedics;  Laterality: Left;    Social History   Tobacco  Use  . Smoking status: Former Smoker    Packs/day: 0.25    Years: 20.00    Pack years: 5.00    Types: Cigarettes    Quit date: 08/13/2013    Years since quitting: 7.2  . Smokeless tobacco: Never Used  Vaping Use  . Vaping Use: Never used  Substance Use Topics  . Alcohol use: Yes    Comment: few glasses of wine a week  . Drug use: No    Family History  Adopted: Yes    Allergies  Allergen Reactions  . Nsaids Other (See Comments)    ulcers  . Latex     Surgical tape not bandaid   . Nitroglycerin     Pt took  Nitroglycerin patches - pt stated, "Made my skin bright red when I changed my patch; it made my foot itch and burn"    Medication list has been reviewed and updated.  Current Outpatient Medications on File Prior to Visit  Medication Sig Dispense Refill  . diphenoxylate-atropine (LOMOTIL) 2.5-0.025 MG tablet TAKE 1 TABLET BY MOUTH 4 (FOUR) TIMES DAILY AS NEEDED FOR DIARRHEA OR LOOSE STOOLS. 120 tablet 2  . gabapentin (NEURONTIN) 300 MG capsule Take 2 capsules (600 mg total) by mouth 4 (four) times daily. 720 capsule 1  . MAGNESIUM PO Take 1 tablet by mouth daily.    . metoCLOPramide (REGLAN) 10 MG tablet Take 1 tablet (10 mg total) by mouth every 6 (six) hours as needed for nausea (or headache). 30 tablet 0  . POTASSIUM PO Take 1 tablet by mouth daily.    . simvastatin (ZOCOR) 20 MG tablet Take 1 tablet (20 mg total) by mouth every evening. 90 tablet 1  . traMADol (ULTRAM) 50 MG tablet Take 1 tablet (50 mg total) by mouth 3 (three) times daily. 90 tablet 1  . Travoprost, BAK Free, (TRAVATAN) 0.004 % SOLN ophthalmic solution     . traZODone (DESYREL) 50 MG tablet TAKE 1/2 TO 1 TABLET BY MOUTH AT BEDTIME AS NEEDED FOR SLEEP. MAY TAKE 2 AS NEEDED 60 tablet 11  . Vitamin D, Ergocalciferol, (DRISDOL) 1.25 MG (50000 UT) CAPS capsule Take 1 capsule (50,000 Units total) by mouth every 7 (seven) days. 8 capsule 0  . medroxyPROGESTERone (DEPO-PROVERA) 150 MG/ML injection Inject 1 mL (150 mg total) into the muscle once. 1 mL 0   No current facility-administered medications on file prior to visit.    Review of Systems:  As per HPI- otherwise negative.   Physical Examination: Vitals:   11/03/20 1446  BP: 112/82  Pulse: (!) 102  Resp: 16  SpO2: 97%   Vitals:   11/03/20 1446  Weight: 179 lb (81.2 kg)  Height: 5\' 8"  (1.727 m)   Body mass index is 27.22 kg/m. Ideal Body Weight: Weight in (lb) to have BMI = 25: 164.1  GEN: no acute distress.  Overweight, looks well  HEENT: Atraumatic,  Normocephalic.   Bilateral TM wnl, oropharynx normal.  PEERL,EOMI.   Ears and Nose: No external deformity. CV: RRR, No M/G/R. No JVD. No thrill. No extra heart sounds. PULM: CTA B, no wheezes, crackles, rhonchi. No retractions. No resp. distress. No accessory muscle use. ABD: S, NT, ND, +BS. No rebound. No HSM. EXTR: No c/c/e PSYCH: Normally interactive. Conversant.  Pelvic: normal, no vaginal lesions or discharge. Uterus normal, no CMT, no adnexal tendereness or masses   Assessment and Plan: Physical exam  Essential hypertension - Plan: CBC, Comprehensive metabolic panel, lisinopril (ZESTRIL) 10 MG  tablet  Depo-Provera contraceptive status  Migraine without aura and without status migrainosus, not intractable - Plan: SUMAtriptan (IMITREX) 50 MG tablet  Other hyperlipidemia - Plan: Lipid panel  Pre-diabetes - Plan: Hemoglobin A1c  Elevated ferritin - Plan: Ferritin  B12 deficiency - Plan: Vitamin B12  Fatigue, unspecified type - Plan: TSH, VITAMIN D 25 Hydroxy (Vit-D Deficiency, Fractures)  Screening for cervical cancer - Plan: Cytology - PAP  Hot flashes - Plan: FSH  Gastroesophageal reflux disease, unspecified whether esophagitis present - Plan: pantoprazole (PROTONIX) 40 MG tablet  Pt here today for a CPE Pap today Labs pending as above- Will plan further follow- up pending labs. Encouraged a healthy diet and exercise routine   This visit occurred during the SARS-CoV-2 public health emergency.  Safety protocols were in place, including screening questions prior to the visit, additional usage of staff PPE, and extensive cleaning of exam room while observing appropriate contact time as indicated for disinfecting solutions.    Signed Abbe Amsterdam, MD  Addendum 3/25, received labs as below.  Message to patient  Results for orders placed or performed in visit on 11/03/20  CBC  Result Value Ref Range   WBC 5.2 4.0 - 10.5 K/uL   RBC 4.32 3.87 - 5.11 Mil/uL    Platelets 280.0 150.0 - 400.0 K/uL   Hemoglobin 12.4 12.0 - 15.0 g/dL   HCT 36.6 44.0 - 34.7 %   MCV 87.1 78.0 - 100.0 fl   MCHC 32.9 30.0 - 36.0 g/dL   RDW 42.5 95.6 - 38.7 %  Comprehensive metabolic panel  Result Value Ref Range   Sodium 139 135 - 145 mEq/L   Potassium 4.6 3.5 - 5.1 mEq/L   Chloride 103 96 - 112 mEq/L   CO2 25 19 - 32 mEq/L   Glucose, Bld 122 (H) 70 - 99 mg/dL   BUN 16 6 - 23 mg/dL   Creatinine, Ser 5.64 0.40 - 1.20 mg/dL   Total Bilirubin 0.3 0.2 - 1.2 mg/dL   Alkaline Phosphatase 50 39 - 117 U/L   AST 12 0 - 37 U/L   ALT 14 0 - 35 U/L   Total Protein 7.5 6.0 - 8.3 g/dL   Albumin 4.9 3.5 - 5.2 g/dL   GFR 332.95 >18.84 mL/min   Calcium 10.2 8.4 - 10.5 mg/dL  Hemoglobin Z6S  Result Value Ref Range   Hgb A1c MFr Bld 6.5 4.6 - 6.5 %  Lipid panel  Result Value Ref Range   Cholesterol 228 (H) 0 - 200 mg/dL   Triglycerides 063.0 (H) 0.0 - 149.0 mg/dL   HDL 16.01 >09.32 mg/dL   VLDL 35.5 (H) 0.0 - 73.2 mg/dL   Total CHOL/HDL Ratio 4    NonHDL 166.40   TSH  Result Value Ref Range   TSH 1.11 0.35 - 4.50 uIU/mL  VITAMIN D 25 Hydroxy (Vit-D Deficiency, Fractures)  Result Value Ref Range   VITD 21.52 (L) 30.00 - 100.00 ng/mL  Vitamin B12  Result Value Ref Range   Vitamin B-12 640 211 - 911 pg/mL  Ferritin  Result Value Ref Range   Ferritin 311.6 (H) 10.0 - 291.0 ng/mL  FSH  Result Value Ref Range   FSH 36.3 mIU/ML  LDL cholesterol, direct  Result Value Ref Range   Direct LDL 129.0 mg/dL

## 2020-11-02 ENCOUNTER — Other Ambulatory Visit: Payer: Self-pay

## 2020-11-03 ENCOUNTER — Ambulatory Visit (INDEPENDENT_AMBULATORY_CARE_PROVIDER_SITE_OTHER): Payer: BC Managed Care – PPO | Admitting: Family Medicine

## 2020-11-03 ENCOUNTER — Encounter: Payer: Self-pay | Admitting: Family Medicine

## 2020-11-03 ENCOUNTER — Other Ambulatory Visit (HOSPITAL_COMMUNITY)
Admission: RE | Admit: 2020-11-03 | Discharge: 2020-11-03 | Disposition: A | Discharge status: Home / Self Care | Payer: BC Managed Care – PPO | Source: Ambulatory Visit | Attending: Family Medicine | Admitting: Family Medicine

## 2020-11-03 VITALS — BP 112/82 | HR 102 | Resp 16 | Ht 68.0 in | Wt 179.0 lb

## 2020-11-03 DIAGNOSIS — R7989 Other specified abnormal findings of blood chemistry: Secondary | ICD-10-CM | POA: Diagnosis not present

## 2020-11-03 DIAGNOSIS — G43009 Migraine without aura, not intractable, without status migrainosus: Secondary | ICD-10-CM | POA: Diagnosis not present

## 2020-11-03 DIAGNOSIS — K219 Gastro-esophageal reflux disease without esophagitis: Secondary | ICD-10-CM

## 2020-11-03 DIAGNOSIS — R519 Headache, unspecified: Secondary | ICD-10-CM | POA: Diagnosis not present

## 2020-11-03 DIAGNOSIS — E7849 Other hyperlipidemia: Secondary | ICD-10-CM | POA: Diagnosis not present

## 2020-11-03 DIAGNOSIS — I10 Essential (primary) hypertension: Secondary | ICD-10-CM | POA: Diagnosis not present

## 2020-11-03 DIAGNOSIS — Z8349 Family history of other endocrine, nutritional and metabolic diseases: Secondary | ICD-10-CM | POA: Diagnosis not present

## 2020-11-03 DIAGNOSIS — R232 Flushing: Secondary | ICD-10-CM

## 2020-11-03 DIAGNOSIS — R5383 Other fatigue: Secondary | ICD-10-CM | POA: Diagnosis not present

## 2020-11-03 DIAGNOSIS — Z3042 Encounter for surveillance of injectable contraceptive: Secondary | ICD-10-CM | POA: Diagnosis not present

## 2020-11-03 DIAGNOSIS — Z124 Encounter for screening for malignant neoplasm of cervix: Secondary | ICD-10-CM

## 2020-11-03 DIAGNOSIS — R040 Epistaxis: Secondary | ICD-10-CM | POA: Diagnosis not present

## 2020-11-03 DIAGNOSIS — R7303 Prediabetes: Secondary | ICD-10-CM

## 2020-11-03 DIAGNOSIS — Z0000 Encounter for general adult medical examination without abnormal findings: Secondary | ICD-10-CM

## 2020-11-03 DIAGNOSIS — E538 Deficiency of other specified B group vitamins: Secondary | ICD-10-CM

## 2020-11-03 DIAGNOSIS — L700 Acne vulgaris: Secondary | ICD-10-CM | POA: Diagnosis not present

## 2020-11-03 MED ORDER — LISINOPRIL 10 MG PO TABS: 10.0000 mg | ORAL_TABLET | Freq: Every day | ORAL | 3 refills | Status: DC

## 2020-11-03 MED ORDER — SUMATRIPTAN SUCCINATE 50 MG PO TABS: ORAL_TABLET | ORAL | 3 refills | Status: DC

## 2020-11-03 MED ORDER — PANTOPRAZOLE SODIUM 40 MG PO TBEC: 40.0000 mg | DELAYED_RELEASE_TABLET | Freq: Every day | ORAL | 3 refills | Status: DC

## 2020-11-04 ENCOUNTER — Encounter: Payer: Self-pay | Admitting: Family Medicine

## 2020-11-04 LAB — COMPREHENSIVE METABOLIC PANEL
ALT: 14 U/L (ref 0–35)
AST: 12 U/L (ref 0–37)
Albumin: 4.9 g/dL (ref 3.5–5.2)
Alkaline Phosphatase: 50 U/L (ref 39–117)
BUN: 16 mg/dL (ref 6–23)
CO2: 25 mEq/L (ref 19–32)
Calcium: 10.2 mg/dL (ref 8.4–10.5)
Chloride: 103 mEq/L (ref 96–112)
Creatinine, Ser: 0.65 mg/dL (ref 0.40–1.20)
GFR: 104.26 mL/min (ref >60.00)
Glucose, Bld: 122 mg/dL — ABNORMAL HIGH (ref 70–99)
Potassium: 4.6 mEq/L (ref 3.5–5.1)
Sodium: 139 mEq/L (ref 135–145)
Total Bilirubin: 0.3 mg/dL (ref 0.2–1.2)
Total Protein: 7.5 g/dL (ref 6.0–8.3)

## 2020-11-04 LAB — VITAMIN D 25 HYDROXY (VIT D DEFICIENCY, FRACTURES): VITD: 21.52 ng/mL — ABNORMAL LOW (ref 30.00–100.00)

## 2020-11-04 LAB — FOLLICLE STIMULATING HORMONE: FSH: 36.3 m[IU]/mL

## 2020-11-04 LAB — CBC
HCT: 37.6 % (ref 36.0–46.0)
Hemoglobin: 12.4 g/dL (ref 12.0–15.0)
MCHC: 32.9 g/dL (ref 30.0–36.0)
MCV: 87.1 fl (ref 78.0–100.0)
Platelets: 280 10*3/uL (ref 150.0–400.0)
RBC: 4.32 Mil/uL (ref 3.87–5.11)
RDW: 13.5 % (ref 11.5–15.5)
WBC: 5.2 10*3/uL (ref 4.0–10.5)

## 2020-11-04 LAB — LIPID PANEL
Cholesterol: 228 mg/dL — ABNORMAL HIGH (ref 0–200)
HDL: 62 mg/dL (ref >39.00)
NonHDL: 166.4
Total CHOL/HDL Ratio: 4
Triglycerides: 297 mg/dL — ABNORMAL HIGH (ref 0.0–149.0)
VLDL: 59.4 mg/dL — ABNORMAL HIGH (ref 0.0–40.0)

## 2020-11-04 LAB — LDL CHOLESTEROL, DIRECT: Direct LDL: 129 mg/dL

## 2020-11-04 LAB — TSH: TSH: 1.11 u[IU]/mL (ref 0.35–4.50)

## 2020-11-04 LAB — HEMOGLOBIN A1C: Hgb A1c MFr Bld: 6.5 % (ref 4.6–6.5)

## 2020-11-04 LAB — VITAMIN B12: Vitamin B-12: 640 pg/mL (ref 211–911)

## 2020-11-04 LAB — FERRITIN: Ferritin: 311.6 ng/mL — ABNORMAL HIGH (ref 10.0–291.0)

## 2020-11-04 MED ORDER — VITAMIN D (ERGOCALCIFEROL) 1.25 MG (50000 UNIT) PO CAPS: 50000.0000 [IU] | ORAL_CAPSULE | ORAL | 0 refills | Status: DC

## 2020-11-04 NOTE — Addendum Note (Signed)
Addended by: Abbe Amsterdam C on: 11/04/2020 05:42 PM   Modules accepted: Orders

## 2020-11-07 ENCOUNTER — Encounter: Payer: Self-pay | Admitting: Family Medicine

## 2020-11-07 LAB — CYTOLOGY - PAP
Comment: NEGATIVE
Diagnosis: NEGATIVE
High risk HPV: NEGATIVE

## 2020-11-15 ENCOUNTER — Encounter: Payer: Self-pay | Admitting: Family Medicine

## 2020-11-22 ENCOUNTER — Ambulatory Visit (INDEPENDENT_AMBULATORY_CARE_PROVIDER_SITE_OTHER): Payer: BC Managed Care – PPO

## 2020-11-22 ENCOUNTER — Other Ambulatory Visit: Payer: Self-pay

## 2020-11-22 DIAGNOSIS — Z3042 Encounter for surveillance of injectable contraceptive: Secondary | ICD-10-CM | POA: Diagnosis not present

## 2020-11-22 NOTE — Progress Notes (Signed)
Patient here today for depo injection. 30mL depo given in left upper outter quadrant. Patient tolerated well. 6/28-7/12 will be her next window. Patient requests to call back to schedule.

## 2020-11-23 DIAGNOSIS — Z3042 Encounter for surveillance of injectable contraceptive: Secondary | ICD-10-CM | POA: Diagnosis not present

## 2020-11-23 MED ORDER — MEDROXYPROGESTERONE ACETATE 150 MG/ML IM SUSP
150.0000 mg | Freq: Once | INTRAMUSCULAR | Status: AC
  Administered 2020-11-23: 150 mg via INTRAMUSCULAR

## 2020-11-27 ENCOUNTER — Other Ambulatory Visit: Payer: Self-pay | Admitting: Family Medicine

## 2021-01-26 DIAGNOSIS — H401133 Primary open-angle glaucoma, bilateral, severe stage: Secondary | ICD-10-CM | POA: Diagnosis not present

## 2021-02-28 ENCOUNTER — Other Ambulatory Visit: Payer: Self-pay

## 2021-02-28 ENCOUNTER — Ambulatory Visit: Payer: BC Managed Care – PPO | Admitting: Family

## 2021-02-28 VITALS — BP 100/60 | HR 82 | Temp 98.0°F | Ht 68.0 in | Wt 183.6 lb

## 2021-02-28 DIAGNOSIS — Z3042 Encounter for surveillance of injectable contraceptive: Secondary | ICD-10-CM | POA: Diagnosis not present

## 2021-02-28 LAB — POCT URINE PREGNANCY: Preg Test, Ur: NEGATIVE

## 2021-02-28 MED ORDER — MEDROXYPROGESTERONE ACETATE 150 MG/ML IM SUSP
150.0000 mg | Freq: Once | INTRAMUSCULAR | Status: AC
  Administered 2021-02-28: 150 mg via INTRAMUSCULAR

## 2021-02-28 NOTE — Progress Notes (Signed)
Pt got injection on RUOQ and tolerated well

## 2021-02-28 NOTE — Progress Notes (Signed)
Lisa [name] is a 50 y.o. female with the following history as recorded in [employer]:  Patient Active Problem List   Diagnosis Date Noted   Elevated ferritin 08/27/2019   Pain in right toe(s) 04/07/2019   Fecal incontinence 01/15/2018   Pre-diabetes 02/14/2017   Left anterior cruciate ligament tear 08/03/2016   Acute medial meniscus tear of left knee 08/03/2016   Acute lateral meniscus tear of left knee 08/03/2016   History of abnormal cervical Pap smear 09/21/2013   Leukocytopenia 03/02/2013   S/P hip replacement 02/06/2013   Hyperlipidemia 10/13/2012   HTN (hypertension) 10/13/2012    Current Outpatient Medications  Medication Sig Dispense Refill   diphenoxylate-atropine (LOMOTIL) 2.5-0.025 MG tablet TAKE 1 TABLET BY MOUTH 4 (FOUR) TIMES DAILY AS NEEDED FOR DIARRHEA OR LOOSE STOOLS. 120 tablet 2   lisinopril (ZESTRIL) 10 MG tablet Take 1 tablet (10 mg total) by mouth daily. 90 tablet 3   MAGNESIUM PO Take 1 tablet by mouth daily.     metoCLOPramide (REGLAN) 10 MG tablet Take 1 tablet (10 mg total) by mouth every 6 (six) hours as needed for nausea (or headache). 30 tablet 0   pantoprazole (PROTONIX) 40 MG tablet Take 1 tablet (40 mg total) by mouth daily. 90 tablet 3   POTASSIUM PO Take 1 tablet by mouth daily.     simvastatin (ZOCOR) 20 MG tablet Take 1 tablet (20 mg total) by mouth every evening. 90 tablet 1   SUMAtriptan (IMITREX) 50 MG tablet TAKE 1 TABLET BY MOUTH EVERY 2 (TWO) HOURS AS NEEDED FOR MIGRAINE. MAX 100 MG IN 24 HOURS 10 tablet 3   traMADol (ULTRAM) 50 MG tablet Take 1 tablet (50 mg total) by mouth 3 (three) times daily. 90 tablet 1   Travoprost, BAK Free, (TRAVATAN) 0.004 % SOLN ophthalmic solution      traZODone (DESYREL) 50 MG tablet TAKE 1/2 TO 1 TABLET BY MOUTH AT BEDTIME AS NEEDED FOR SLEEP *MAY TAKE 2 AS NEEDED * 180 tablet 4   Vitamin D, Ergocalciferol, (DRISDOL) 1.25 MG (50000 UNIT) CAPS capsule Take 1 capsule (50,000 Units total) by mouth every 7 (seven)  days. 12 capsule 0   medroxyPROGESTERone (DEPO-PROVERA) 150 MG/ML injection Inject 1 mL (150 mg total) into the muscle once. 1 mL 0   No current facility-administered medications for this visit.    Allergies: Nsaids, Latex, and Nitroglycerin  Past Medical History:  Diagnosis Date   Anemia    hx of   Anxiety 20 years ago   Depression 20 years ago   GERD (gastroesophageal reflux disease)    Heart murmur    Hyperlipidemia    Hypertension    history of htn, no meds now   Ulcer    x2   Vaginal Pap smear, abnormal     Past Surgical History:  Procedure Laterality Date   ANTERIOR CRUCIATE LIGAMENT REPAIR Left 09/04/2016   Procedure: LEFT KNEE MEDIAL COLLATERAL LIGAMENT REPAIR, ANTERIOR CRUCIATE LIGAMENT RECONSTRUCTION, MENISCAL DEBRIDEMENT VERSUS REPAIR;  Surgeon: Cammy Copa, MD;  Location: [facility];  Service: Orthopedics;  Laterality: Left;   ESOPHAGOGASTRODUODENOSCOPY ENDOSCOPY     at least 4   LAPAROSCOPIC APPENDECTOMY N/A 02/23/2015   Procedure: APPENDECTOMY LAPAROSCOPIC;  Surgeon: Gaynelle Adu, MD;  Location: [facility];  Service: General;  Laterality: N/A;   TOTAL HIP ARTHROPLASTY Left 02/06/2013   Procedure: LEFT TOTAL HIP ARTHROPLASTY ANTERIOR APPROACH;  Surgeon: Kathryne Hitch, MD;  Location: [facility];  Service: Orthopedics;  Laterality: Left;  Family History  Adopted: Yes    Social History   Tobacco Use   Smoking status: Former    Packs/day: 0.25    Years: 20.00    Pack years: 5.00    Types: Cigarettes    Quit date: 08/13/2013    Years since quitting: 7.5   Smokeless tobacco: Never  Substance Use Topics   Alcohol use: Yes    Comment: few glasses of wine a week    Subjective:   Patient was due for Depo-Provera last week; she is 1 week past her window and denies any chance of being pregnant; has been on Depo Provera for many years and done well. Needs to get her injection updated today; is up to date on pap smear with her PCP;    Objective:  Vitals:    02/28/21 1530  BP: 100/60  Pulse: 82  Temp: 98 F (36.7 C)  TempSrc: Oral  SpO2: 99%  Weight: 183 lb 9.6 oz (83.3 kg)  Height: 5\' 8"  (1.727 m)    General: Well developed, well nourished, in no acute distress  Skin : Warm and dry.  Head: Normocephalic and atraumatic  Lungs: Respirations unlabored;  Neurologic: Alert and oriented; speech intact; face symmetrical; moves all extremities well; CNII-XII intact without focal deficit   Assessment:  1. Depo-Provera contraceptive status     Plan:  Denies chance of being pregnant; hcg in office is negative; Depo-Provera given; return in 3 months for next Depo.  This visit occurred during the SARS-CoV-2 public health emergency.  Safety protocols were in place, including screening questions prior to the visit, additional usage of staff PPE, and extensive cleaning of exam room while observing appropriate contact time as indicated for disinfecting solutions.    Return in about 3 months (around 05/31/2021) for Depo-Provera/ nurse visit.  Orders Placed This Encounter  Procedures   POCT urine pregnancy    Requested Prescriptions    No prescriptions requested or ordered in this encounter

## 2021-03-30 ENCOUNTER — Other Ambulatory Visit: Payer: Self-pay | Admitting: Family Medicine

## 2021-03-30 DIAGNOSIS — E7849 Other hyperlipidemia: Secondary | ICD-10-CM

## 2021-05-19 ENCOUNTER — Ambulatory Visit (INDEPENDENT_AMBULATORY_CARE_PROVIDER_SITE_OTHER): Payer: BC Managed Care – PPO

## 2021-05-19 ENCOUNTER — Other Ambulatory Visit: Payer: Self-pay

## 2021-05-19 DIAGNOSIS — Z23 Encounter for immunization: Secondary | ICD-10-CM

## 2021-05-19 DIAGNOSIS — Z3042 Encounter for surveillance of injectable contraceptive: Secondary | ICD-10-CM

## 2021-05-19 MED ORDER — MEDROXYPROGESTERONE ACETATE 150 MG/ML IM SUSP
150.0000 mg | Freq: Once | INTRAMUSCULAR | Status: AC
  Administered 2021-05-19: 150 mg via INTRAMUSCULAR

## 2021-05-19 NOTE — Progress Notes (Addendum)
Pt is here today for depo shot and flu vaccine. Pt was given depo shot in left glute and flu vaccine in left deltoid. Pt tolerated both well

## 2021-05-20 DIAGNOSIS — Z1231 Encounter for screening mammogram for malignant neoplasm of breast: Secondary | ICD-10-CM | POA: Diagnosis not present

## 2021-07-10 ENCOUNTER — Encounter: Payer: Self-pay | Admitting: Family Medicine

## 2021-07-10 NOTE — Telephone Encounter (Signed)
Called her- sx are stable for one week.  She will see me on Wednesday-

## 2021-07-12 ENCOUNTER — Ambulatory Visit: Payer: BC Managed Care – PPO | Attending: Internal Medicine

## 2021-07-12 DIAGNOSIS — Z23 Encounter for immunization: Secondary | ICD-10-CM

## 2021-07-12 NOTE — Progress Notes (Signed)
   Covid-19 Vaccination Clinic  Name:  Lisa [name]    MRN: [id number] DOB: [birth year]  07/12/2021  Lisa [name] was observed post Covid-19 immunization for 15 minutes without incident. She was provided with Vaccine Information Sheet and instruction to access the V-Safe system.   Lisa [name] was instructed to call 911 with any severe reactions post vaccine: Difficulty breathing  Swelling of face and throat  A fast heartbeat  A bad rash all over body  Dizziness and weakness   Immunizations Administered     Name Date Dose VIS Date Route   Pfizer Covid-19 Vaccine Bivalent Booster 07/12/2021 12:24 PM 0.3 mL 04/12/2021 Intramuscular   Manufacturer: [employer], [employer]   Lot: [id number]   NDC: [phone]

## 2021-07-15 ENCOUNTER — Other Ambulatory Visit (HOSPITAL_BASED_OUTPATIENT_CLINIC_OR_DEPARTMENT_OTHER): Payer: Self-pay

## 2021-07-15 MED ORDER — PFIZER COVID-19 VAC BIVALENT 30 MCG/0.3ML IM SUSP
INTRAMUSCULAR | 0 refills | Status: DC
  Filled 2021-07-15: qty 0.3, 1d supply, fill #0

## 2021-07-17 ENCOUNTER — Other Ambulatory Visit (HOSPITAL_BASED_OUTPATIENT_CLINIC_OR_DEPARTMENT_OTHER): Payer: Self-pay

## 2021-08-02 DIAGNOSIS — H401132 Primary open-angle glaucoma, bilateral, moderate stage: Secondary | ICD-10-CM | POA: Diagnosis not present

## 2021-08-11 ENCOUNTER — Ambulatory Visit: Payer: BC Managed Care – PPO | Admitting: Internal Medicine

## 2021-08-11 ENCOUNTER — Other Ambulatory Visit: Payer: Self-pay

## 2021-08-11 ENCOUNTER — Ambulatory Visit (HOSPITAL_BASED_OUTPATIENT_CLINIC_OR_DEPARTMENT_OTHER)
Admission: RE | Admit: 2021-08-11 | Discharge: 2021-08-11 | Disposition: A | Discharge status: Home / Self Care | Payer: BC Managed Care – PPO | Source: Ambulatory Visit | Attending: Internal Medicine | Admitting: Internal Medicine

## 2021-08-11 ENCOUNTER — Encounter: Payer: Self-pay | Admitting: Internal Medicine

## 2021-08-11 VITALS — BP 126/80 | HR 110 | Temp 98.6°F | Resp 18 | Ht 68.0 in | Wt 188.4 lb

## 2021-08-11 DIAGNOSIS — W19XXXA Unspecified fall, initial encounter: Secondary | ICD-10-CM | POA: Diagnosis not present

## 2021-08-11 DIAGNOSIS — J929 Pleural plaque without asbestos: Secondary | ICD-10-CM | POA: Diagnosis not present

## 2021-08-11 DIAGNOSIS — S20219A Contusion of unspecified front wall of thorax, initial encounter: Secondary | ICD-10-CM

## 2021-08-11 DIAGNOSIS — R918 Other nonspecific abnormal finding of lung field: Secondary | ICD-10-CM | POA: Diagnosis not present

## 2021-08-11 DIAGNOSIS — S299XXA Unspecified injury of thorax, initial encounter: Secondary | ICD-10-CM | POA: Diagnosis not present

## 2021-08-11 DIAGNOSIS — Z3042 Encounter for surveillance of injectable contraceptive: Secondary | ICD-10-CM | POA: Diagnosis not present

## 2021-08-11 MED ORDER — MEDROXYPROGESTERONE ACETATE 150 MG/ML IM SUSP
150.0000 mg | Freq: Once | INTRAMUSCULAR | Status: AC
  Administered 2021-08-11: 15:00:00 150 mg via INTRAMUSCULAR

## 2021-08-11 NOTE — Progress Notes (Signed)
Subjective:    Patient ID: Lisa [name], female    DOB: 1971/08/11, 50 y.o.   MRN: [id number]  DOS:  08/11/2021 Type of visit - description: acute   Here for a DEPO shot but also has an acute issue  2 days ago, she was at work, she was getting up from her chair and her foot got caught and she fell, landed on her knees and subsequently hit the center of her chest on the corner of a credenza.  Denies any head or neck injury. Since then is having pain at the site of the impact mostly when she takes a deep breath or when she coughs. She is here at the request of her employer  Unrelated to above, yesterday had another fall, twisted and ankle, no major injuries from that.  Denies any abdominal pain.  Review of Systems See above   Past Medical History:  Diagnosis Date   Anemia    hx of   Anxiety 20 years ago   Depression 20 years ago   GERD (gastroesophageal reflux disease)    Heart murmur    Hyperlipidemia    Hypertension    history of htn, no meds now   Ulcer    x2   Vaginal Pap smear, abnormal     Past Surgical History:  Procedure Laterality Date   ANTERIOR CRUCIATE LIGAMENT REPAIR Left 09/04/2016   Procedure: LEFT KNEE MEDIAL COLLATERAL LIGAMENT REPAIR, ANTERIOR CRUCIATE LIGAMENT RECONSTRUCTION, MENISCAL DEBRIDEMENT VERSUS REPAIR;  Surgeon: Cammy Copa, MD;  Location: [facility];  Service: Orthopedics;  Laterality: Left;   ESOPHAGOGASTRODUODENOSCOPY ENDOSCOPY     at least 4   LAPAROSCOPIC APPENDECTOMY N/A 02/23/2015   Procedure: APPENDECTOMY LAPAROSCOPIC;  Surgeon: Gaynelle Adu, MD;  Location: [facility];  Service: General;  Laterality: N/A;   TOTAL HIP ARTHROPLASTY Left 02/06/2013   Procedure: LEFT TOTAL HIP ARTHROPLASTY ANTERIOR APPROACH;  Surgeon: Kathryne Hitch, MD;  Location: [facility];  Service: Orthopedics;  Laterality: Left;    Allergies as of 08/11/2021       Reactions   Nsaids Other (See Comments)   ulcers   Latex    Surgical tape not bandaid    Nitroglycerin    Pt took Nitroglycerin patches - pt stated, "Made my skin bright red when I changed my patch; it made my foot itch and burn"        Medication List        Accurate as of August 11, 2021 11:59 PM. If you have any questions, ask your nurse or doctor.          STOP taking these medications    Pfizer COVID-19 Vac Bivalent injection Generic drug: COVID-19 mRNA bivalent vaccine Proofreader) Stopped by: Willow Ora, MD   Travoprost (BAK Free) 0.004 % Soln ophthalmic solution Commonly known as: TRAVATAN Stopped by: Willow Ora, MD       TAKE these medications    diphenoxylate-atropine 2.5-0.025 MG tablet Commonly known as: LOMOTIL TAKE 1 TABLET BY MOUTH 4 (FOUR) TIMES DAILY AS NEEDED FOR DIARRHEA OR LOOSE STOOLS.   latanoprost 0.005 % ophthalmic solution Commonly known as: XALATAN Place 1 drop into both eyes daily.   lisinopril 10 MG tablet Commonly known as: ZESTRIL Take 1 tablet (10 mg total) by mouth daily.   MAGNESIUM PO Take 1 tablet by mouth daily.   medroxyPROGESTERone 150 MG/ML injection Commonly known as: DEPO-PROVERA Inject 1 mL (150 mg total) into the muscle once.   metoCLOPramide 10 MG tablet  Commonly known as: REGLAN Take 1 tablet (10 mg total) by mouth every 6 (six) hours as needed for nausea (or headache).   pantoprazole 40 MG tablet Commonly known as: PROTONIX Take 1 tablet (40 mg total) by mouth daily.   POTASSIUM PO Take 1 tablet by mouth daily.   simvastatin 20 MG tablet Commonly known as: ZOCOR TAKE 1 TABLET BY MOUTH EVERY DAY IN THE EVENING   SUMAtriptan 50 MG tablet Commonly known as: IMITREX TAKE 1 TABLET BY MOUTH EVERY 2 (TWO) HOURS AS NEEDED FOR MIGRAINE. MAX 100 MG IN 24 HOURS   traMADol 50 MG tablet Commonly known as: Ultram Take 1 tablet (50 mg total) by mouth 3 (three) times daily.   traZODone 50 MG tablet Commonly known as: DESYREL TAKE 1/2 TO 1 TABLET BY MOUTH AT BEDTIME AS NEEDED FOR SLEEP *MAY TAKE 2 AS NEEDED  *   Vitamin D (Ergocalciferol) 1.25 MG (50000 UNIT) Caps capsule Commonly known as: DRISDOL Take 1 capsule (50,000 Units total) by mouth every 7 (seven) days.           Objective:   Physical Exam Chest:     BP 126/80 (BP Location: Left Arm, Patient Position: Sitting, Cuff Size: Small)    Pulse (!) 110    Temp 98.6 F (37 C) (Oral)    Resp 18    Ht 5\' 8"  (1.727 m)    Wt 188 lb 6 oz (85.4 kg)    SpO2 98%    BMI 28.64 kg/m  General:   Well developed, NAD, BMI noted. HEENT:  Normocephalic . Face symmetric, atraumatic Lungs:  CTA B Normal respiratory effort, no intercostal retractions, no accessory muscle use. Chest wall: See graphic Heart: RRR,  no murmur. Abdomen: Soft, not tender. Lower extremities: no pretibial edema bilaterally  Skin: Not pale. Not jaundice Neurologic:  alert & oriented X3.  Speech normal, gait appropriate for age and unassisted Psych--  Cognition and judgment appear intact.  Cooperative with normal attention span and concentration.  Behavior appropriate. No anxious or depressed appearing.      Assessment    Here for a Depo shot.  Provided  Chest contusion: As described above, fall was mechanical, injured the distal sternum, no abdominal pain per se. For completeness we will get a x-ray of the sternum. Anticipates they will hurt for at least 2 or 3 weeks. Okay to take Tylenol and ibuprofen as needed.    This visit occurred during the SARS-CoV-2 public health emergency.  Safety protocols were in place, including screening questions prior to the visit, additional usage of staff PPE, and extensive cleaning of exam room while observing appropriate contact time as indicated for disinfecting solutions.

## 2021-08-11 NOTE — Patient Instructions (Signed)
Please go to the first floor and obtain x-ray of your sternum  Your next Depo shot should be between March 17 to March 31

## 2021-09-25 ENCOUNTER — Other Ambulatory Visit: Payer: Self-pay | Admitting: Family Medicine

## 2021-09-25 DIAGNOSIS — E7849 Other hyperlipidemia: Secondary | ICD-10-CM

## 2021-10-29 ENCOUNTER — Encounter: Payer: Self-pay | Admitting: Family Medicine

## 2021-10-29 NOTE — Progress Notes (Addendum)
Nature conservation officer at [employer] ?[address], [address] ?[place], [place] [address] ?336 [phone] ?Fax [phone] ? ?Date:  11/01/2021  ? ?Name:  Lisa [name]   DOB:  October 17, 1970   MRN:  [id number] ? ?PCP:  Pearline Cables, MD  ? ? ?Chief Complaint: Elbow Problem (Spot near elbow, round, moves around under skin. ) ? ? ?History of Present Illness: ? ?Lisa [name] is a 51 y.o. very pleasant female patient who presents with the following: ? ?Lisa [name] is here today to discuss an issue with her elbow ?Most recently seen by myself about 1 year ago for a physical ? ?Pt with history of fecal incontinence now with sacral neuromodulator, HTN, hyperlipidemia, pre-diabetes, left hip replacement due to AVN, hemochromatosis managed by hematology, B12 deficiency ?Long term use of depo-provera-she has not previously wanted to stop Depo, will talk to her about this again today especially as fertility is unlikely at her age and her bone density may now be at risk ?We can use an FSH level today to confirm if she is in menopause ?Pap smear last year was negative with negative HPV ? ?Mammogram is up-to-date ?Colon cancer screening up-to-date ?Labs 1 year ago, can update today ? ?She has noted a mobile cyst over her lateral right elbow- she is not quite sure how long it has been there-it is only visible if her elbow is held in a certain position so she might not of noticed it ?Not tender or otherwise bothersome ? ?She also notes a possible ingrown hair or other issue on her vulva ?It has been there for perhaps 2 years -will come and go ?It may come up every few months for a week or so ?It can be uncomfortable and she avoids shaving this area ? ?Patient Active Problem List  ? Diagnosis Date Noted  ? Elevated ferritin 08/27/2019  ? Pain in right toe(s) 04/07/2019  ? Fecal incontinence 01/15/2018  ? Pre-diabetes 02/14/2017  ? Left anterior cruciate ligament tear 08/03/2016  ? Acute medial meniscus tear of left knee 08/03/2016   ? Acute lateral meniscus tear of left knee 08/03/2016  ? History of abnormal cervical Pap smear 09/21/2013  ? Leukocytopenia 03/02/2013  ? S/P hip replacement 02/06/2013  ? Hyperlipidemia 10/13/2012  ? HTN (hypertension) 10/13/2012  ? ? ?Past Medical History:  ?Diagnosis Date  ? Anemia   ? hx of  ? Anxiety 20 years ago  ? Depression 20 years ago  ? GERD (gastroesophageal reflux disease)   ? Heart murmur   ? Hyperlipidemia   ? Hypertension   ? history of htn, no meds now  ? Ulcer   ? x2  ? Vaginal Pap smear, abnormal   ? ? ?Past Surgical History:  ?Procedure Laterality Date  ? ANTERIOR CRUCIATE LIGAMENT REPAIR Left 09/04/2016  ? Procedure: LEFT KNEE MEDIAL COLLATERAL LIGAMENT REPAIR, ANTERIOR CRUCIATE LIGAMENT RECONSTRUCTION, MENISCAL DEBRIDEMENT VERSUS REPAIR;  Surgeon: Cammy Copa, MD;  Location: [facility];  Service: Orthopedics;  Laterality: Left;  ? ESOPHAGOGASTRODUODENOSCOPY ENDOSCOPY    ? at least 4  ? LAPAROSCOPIC APPENDECTOMY N/A 02/23/2015  ? Procedure: APPENDECTOMY LAPAROSCOPIC;  Surgeon: Gaynelle Adu, MD;  Location: [facility];  Service: General;  Laterality: N/A;  ? TOTAL HIP ARTHROPLASTY Left 02/06/2013  ? Procedure: LEFT TOTAL HIP ARTHROPLASTY ANTERIOR APPROACH;  Surgeon: Kathryne Hitch, MD;  Location: [facility];  Service: Orthopedics;  Laterality: Left;  ? ? ?Social History  ? ?Tobacco Use  ? Smoking  status: Former  ?  Packs/day: 0.25  ?  Years: 20.00  ?  Pack years: 5.00  ?  Types: Cigarettes  ?  Quit date: 08/13/2013  ?  Years since quitting: 8.2  ? Smokeless tobacco: Never  ?Vaping Use  ? Vaping Use: Never used  ?Substance Use Topics  ? Alcohol use: Yes  ?  Comment: few glasses of wine a week  ? Drug use: No  ? ? ?Family History  ?Adopted: Yes  ? ? ?Allergies  ?Allergen Reactions  ? Nsaids Other (See Comments)  ?  ulcers  ? Latex   ?  Surgical tape not bandaid ?  ? Nitroglycerin   ?  Pt took Nitroglycerin patches - pt stated, "Made my skin bright red when I changed my patch; it made my foot itch and  burn"  ? ? ?Medication list has been reviewed and updated. ? ?Current Outpatient Medications on File Prior to Visit  ?Medication Sig Dispense Refill  ? latanoprost (XALATAN) 0.005 % ophthalmic solution Place 1 drop into both eyes daily.    ? lisinopril (ZESTRIL) 10 MG tablet Take 1 tablet (10 mg total) by mouth daily. 90 tablet 3  ? MAGNESIUM PO Take 1 tablet by mouth daily.    ? medroxyPROGESTERone (DEPO-PROVERA) 150 MG/ML injection Inject 1 mL (150 mg total) into the muscle once. 1 mL 0  ? pantoprazole (PROTONIX) 40 MG tablet Take 1 tablet (40 mg total) by mouth daily. 90 tablet 3  ? POTASSIUM PO Take 1 tablet by mouth daily.    ? simvastatin (ZOCOR) 20 MG tablet TAKE 1 TABLET BY MOUTH EVERY DAY IN THE EVENING 30 tablet 5  ? SUMAtriptan (IMITREX) 50 MG tablet TAKE 1 TABLET BY MOUTH EVERY 2 (TWO) HOURS AS NEEDED FOR MIGRAINE. MAX 100 MG IN 24 HOURS 10 tablet 3  ? traMADol (ULTRAM) 50 MG tablet Take 1 tablet (50 mg total) by mouth 3 (three) times daily. 90 tablet 1  ? traZODone (DESYREL) 50 MG tablet TAKE 1/2 TO 1 TABLET BY MOUTH AT BEDTIME AS NEEDED FOR SLEEP *MAY TAKE 2 AS NEEDED * 180 tablet 4  ? Vitamin D, Ergocalciferol, (DRISDOL) 1.25 MG (50000 UNIT) CAPS capsule Take 1 capsule (50,000 Units total) by mouth every 7 (seven) days. 12 capsule 0  ? diphenoxylate-atropine (LOMOTIL) 2.5-0.025 MG tablet TAKE 1 TABLET BY MOUTH 4 (FOUR) TIMES DAILY AS NEEDED FOR DIARRHEA OR LOOSE STOOLS. (Patient not taking: Reported on 08/11/2021) 120 tablet 2  ? metoCLOPramide (REGLAN) 10 MG tablet Take 1 tablet (10 mg total) by mouth every 6 (six) hours as needed for nausea (or headache). (Patient not taking: Reported on 08/11/2021) 30 tablet 0  ? ?No current facility-administered medications on file prior to visit.  ? ? ?Review of Systems: ? ?As per HPI- otherwise negative. ? ? ?Physical Examination: ?Vitals:  ? 11/01/21 1306  ?BP: 110/70  ?Pulse: 99  ?Resp: 20  ?SpO2: 100%  ? ?Vitals:  ? 11/01/21 1306  ?Weight: 186 lb 11.2 oz  (84.7 kg)  ? ?Body mass index is 28.39 kg/m?. ?Ideal Body Weight:   ? ?GEN: no acute distress.  Overweight, looks well ?HEENT: Atraumatic, Normocephalic.  ?Ears and Nose: No external deformity. ?CV: RRR, No M/G/R. No JVD. No thrill. No extra heart sounds. ?PULM: CTA B, no wheezes, crackles, rhonchi. No retractions. No resp. distress. No accessory muscle use. ?EXTR: No c/c/e ?PSYCH: Normally interactive. Conversant.  ?The right lateral elbow displays a tiny, mobile cystic mass just under the  skin.  It is approximately 4 mm in diameter.  Nontender, firm texture consistent with a cyst ?No pain or restriction elbow range of motion ?Left labia majora displays what appears to be a small area of HSV ? ?Assessment and Plan: ?Other hyperlipidemia - Plan: Lipid panel ? ?Elevated ferritin - Plan: Ferritin ? ?B12 deficiency - Plan: Vitamin B12 ? ?Pre-diabetes - Plan: Comprehensive metabolic panel, Hemoglobin A1c ? ?Essential hypertension - Plan: CBC ? ?Screening for thyroid disorder - Plan: TSH ? ?HSV (herpes simplex virus) anogenital infection - Plan: valACYclovir (VALTREX) 1000 MG tablet ? ?Irregular menstrual bleeding - Plan: FSH ? ?Cyst of skin and subcutaneous tissue ? ?Following up today.  Routine labs are pending as above ?Discussed cyst on elbow -it appears to be a benign subcutaneous cyst.  Offered to set up an ultrasound for confirmation, for the time being she declines.  Will let me know if getting worse ? ?Discussed what appears to be HSV on her labia.  Offered to do a viral culture.  Patient notes she had 1 very severe outbreak of genital HSV in her 6520s, she actually had to be hospitalized.  Since that it has not bothered her, but she feels comfortable with initial diagnosis of HSV.  We will have her start on Valtrex for suppression for a few months ? ?Discussed Depo-Provera.  She has been on Depo since her daughter was born, she is now 51 years old.  I advised patient that it may be best to come off Depo now as  she is likely in menopause-Depo is less likely to be benefiting her and there may be adverse effects to her bone density.  [facility]-she feels comfortable foregoing her shot for now ? ?Will

## 2021-11-01 ENCOUNTER — Ambulatory Visit: Payer: BC Managed Care – PPO | Admitting: Family Medicine

## 2021-11-01 ENCOUNTER — Encounter: Payer: Self-pay | Admitting: Family Medicine

## 2021-11-01 VITALS — BP 110/70 | HR 99 | Resp 20 | Wt 186.7 lb

## 2021-11-01 DIAGNOSIS — E7849 Other hyperlipidemia: Secondary | ICD-10-CM

## 2021-11-01 DIAGNOSIS — Z1329 Encounter for screening for other suspected endocrine disorder: Secondary | ICD-10-CM | POA: Diagnosis not present

## 2021-11-01 DIAGNOSIS — E538 Deficiency of other specified B group vitamins: Secondary | ICD-10-CM | POA: Diagnosis not present

## 2021-11-01 DIAGNOSIS — R7989 Other specified abnormal findings of blood chemistry: Secondary | ICD-10-CM | POA: Diagnosis not present

## 2021-11-01 DIAGNOSIS — R7303 Prediabetes: Secondary | ICD-10-CM

## 2021-11-01 DIAGNOSIS — L720 Epidermal cyst: Secondary | ICD-10-CM

## 2021-11-01 DIAGNOSIS — N926 Irregular menstruation, unspecified: Secondary | ICD-10-CM | POA: Diagnosis not present

## 2021-11-01 DIAGNOSIS — I10 Essential (primary) hypertension: Secondary | ICD-10-CM | POA: Diagnosis not present

## 2021-11-01 DIAGNOSIS — A609 Anogenital herpesviral infection, unspecified: Secondary | ICD-10-CM

## 2021-11-01 MED ORDER — VALACYCLOVIR HCL 1 G PO TABS: 1000.0000 mg | ORAL_TABLET | Freq: Every day | ORAL | 1 refills | Status: DC

## 2021-11-01 NOTE — Patient Instructions (Signed)
It was great to see you again today, I will be in touch with your labs ?Assuming your [facility] level is in menopause range I think we are okay to stop Depo-Provera permanently ? ?Use the Valtrex daily for suppressive therapy ?

## 2021-11-02 ENCOUNTER — Encounter: Payer: Self-pay | Admitting: Family Medicine

## 2021-11-02 LAB — COMPREHENSIVE METABOLIC PANEL
ALT: 19 U/L (ref 0–35)
AST: 16 U/L (ref 0–37)
Albumin: 4.7 g/dL (ref 3.5–5.2)
Alkaline Phosphatase: 62 U/L (ref 39–117)
BUN: 11 mg/dL (ref 6–23)
CO2: 27 mEq/L (ref 19–32)
Calcium: 9.5 mg/dL (ref 8.4–10.5)
Chloride: 102 mEq/L (ref 96–112)
Creatinine, Ser: 0.49 mg/dL (ref 0.40–1.20)
GFR: 110.83 mL/min (ref >60.00)
Glucose, Bld: 96 mg/dL (ref 70–99)
Potassium: 4.3 mEq/L (ref 3.5–5.1)
Sodium: 138 mEq/L (ref 135–145)
Total Bilirubin: 0.5 mg/dL (ref 0.2–1.2)
Total Protein: 7.1 g/dL (ref 6.0–8.3)

## 2021-11-02 LAB — LIPID PANEL
Cholesterol: 202 mg/dL — ABNORMAL HIGH (ref 0–200)
HDL: 71.8 mg/dL (ref >39.00)
NonHDL: 129.97
Total CHOL/HDL Ratio: 3
Triglycerides: 220 mg/dL — ABNORMAL HIGH (ref 0.0–149.0)
VLDL: 44 mg/dL — ABNORMAL HIGH (ref 0.0–40.0)

## 2021-11-02 LAB — CBC
HCT: 38.1 % (ref 36.0–46.0)
Hemoglobin: 12.3 g/dL (ref 12.0–15.0)
MCHC: 32.4 g/dL (ref 30.0–36.0)
MCV: 89.7 fl (ref 78.0–100.0)
Platelets: 286 10*3/uL (ref 150.0–400.0)
RBC: 4.25 Mil/uL (ref 3.87–5.11)
RDW: 13.4 % (ref 11.5–15.5)
WBC: 5.1 10*3/uL (ref 4.0–10.5)

## 2021-11-02 LAB — FOLLICLE STIMULATING HORMONE: FSH: 18.2 m[IU]/mL

## 2021-11-02 LAB — TSH: TSH: 1.66 u[IU]/mL (ref 0.35–5.50)

## 2021-11-02 LAB — HEMOGLOBIN A1C: Hgb A1c MFr Bld: 6.4 % (ref 4.6–6.5)

## 2021-11-02 LAB — LDL CHOLESTEROL, DIRECT: Direct LDL: 111 mg/dL

## 2021-11-02 LAB — FERRITIN: Ferritin: 262.2 ng/mL (ref 10.0–291.0)

## 2021-11-02 LAB — VITAMIN B12: Vitamin B-12: 333 pg/mL (ref 211–911)

## 2021-11-07 ENCOUNTER — Ambulatory Visit (INDEPENDENT_AMBULATORY_CARE_PROVIDER_SITE_OTHER): Payer: BC Managed Care – PPO | Admitting: *Deleted

## 2021-11-07 DIAGNOSIS — Z3042 Encounter for surveillance of injectable contraceptive: Secondary | ICD-10-CM

## 2021-11-07 MED ORDER — MEDROXYPROGESTERONE ACETATE 150 MG/ML IM SUSP
150.0000 mg | Freq: Once | INTRAMUSCULAR | Status: AC
  Administered 2021-11-07: 150 mg via INTRAMUSCULAR

## 2021-11-07 NOTE — Progress Notes (Signed)
Patient here today for Depo-Provera per Dr. Lorelei Pont ? ?Depo Provera 68mL given in right upper outer quadrarant. Patient tolerated well. ?  ?Pt next injection due 01/23/22-02/06/22. ?Pt will call for next appt. ?

## 2021-11-15 ENCOUNTER — Other Ambulatory Visit: Payer: Self-pay | Admitting: Family Medicine

## 2021-11-15 DIAGNOSIS — G43009 Migraine without aura, not intractable, without status migrainosus: Secondary | ICD-10-CM

## 2021-11-22 ENCOUNTER — Telehealth: Payer: Self-pay

## 2021-11-22 ENCOUNTER — Other Ambulatory Visit: Payer: Self-pay

## 2021-11-22 ENCOUNTER — Other Ambulatory Visit: Payer: Self-pay | Admitting: Family Medicine

## 2021-11-22 DIAGNOSIS — R0789 Other chest pain: Secondary | ICD-10-CM | POA: Diagnosis not present

## 2021-11-22 DIAGNOSIS — K219 Gastro-esophageal reflux disease without esophagitis: Secondary | ICD-10-CM

## 2021-11-22 DIAGNOSIS — I10 Essential (primary) hypertension: Secondary | ICD-10-CM | POA: Diagnosis not present

## 2021-11-22 DIAGNOSIS — R079 Chest pain, unspecified: Secondary | ICD-10-CM | POA: Diagnosis not present

## 2021-11-22 MED ORDER — PANTOPRAZOLE SODIUM 40 MG PO TBEC: 40.0000 mg | DELAYED_RELEASE_TABLET | Freq: Every day | ORAL | 8 refills | Status: DC

## 2021-11-22 NOTE — Telephone Encounter (Signed)
Left vm to return call.    

## 2021-11-22 NOTE — Telephone Encounter (Signed)
Nurse Assessment ?Nurse: Carylon Perches, RN, Hilda Lias Date/Time Lamount Cohen Time): 11/22/2021 8:40:28 AM ?Confirm and document reason for call. If ?symptomatic, describe symptoms. ?---Caller states she has been experiencing chest pain ?and chills. Started last night. Feels a sharp chest pain ?radiating to the back. Hit hard and crying it hurts so ?bad. Its still there. ?Does the patient have any new or worsening ?symptoms? ---Yes ?Will a triage be completed? ---Yes ?Related visit to physician within the last 2 weeks? ---No ?Does the PT have any chronic conditions? (i.e. ?diabetes, asthma, this includes High risk factors for ?pregnancy, etc.) ?---No ?Is the patient pregnant or possibly pregnant? (Ask ?all females between the ages of 23-55) ---No ?Is this a behavioral health or substance abuse call? ---No ?Guidelines ?Guideline Title Affirmed Question Affirmed Notes Nurse Date/Time (Eastern ?Time) ?Chest Pain [1] Chest pain lasts > ?5 minutes AND [2] ?age > 19 ?Carylon Perches, RN, Hilda Lias 11/22/2021 8:43:49 ?AM ?Disp. Time (Eastern ?Time) Disposition Final User ?11/22/2021 8:38:33 AM Send to Urgent Fanny Bien, [place] ?11/22/2021 9:03:14 AM 911 Outcome Documentation Carylon Perches, RN, Hilda Lias ?PLEASE NOTE: All timestamps contained within this report are represented as [place] Standard Time. ?CONFIDENTIALTY NOTICE: This fax transmission is intended only for the addressee. It contains information that is legally privileged, confidential or ?otherwise protected from use or disclosure. If you are not the intended recipient, you are strictly prohibited from reviewing, disclosing, copying using ?or disseminating any of this information or taking any action in reliance on or regarding this information. If you have received this fax in error, please ?notify us immediately by telephone so that we can arrange for its return to [place]. Phone: [phone], Toll-Free: [phone], Fax: [phone] ?Page: 2 of 2 ?Call Id: [id number] ?Disp. Time (Eastern ?Time)  Disposition Final User ?Reason: Unable to reach ?11/22/2021 8:45:47 AM Call EMS 911 Now Yes Carylon Perches, RN, Hilda Lias ?Caller Disagree/Comply Comply ?Caller Understands Yes ?PreDisposition Did not know what to do ?Care Advice Given Per Guideline ?CALL EMS 911 NOW: * Immediate medical attention is needed. You need to hang up and call 911 (or an ambulance). * Triager ?Discretion: I'll call you back in a few minutes to be sure you were able to reach them. CARE ADVICE given per Chest Pain (Adult) ?guideline. ?Referrals ?[facility] - ED ?

## 2021-11-22 NOTE — Telephone Encounter (Signed)
My Chart message sent

## 2021-11-22 NOTE — Telephone Encounter (Signed)
Left vm to return call.  (x2.) ?

## 2021-11-22 NOTE — Telephone Encounter (Signed)
Caller Name Merlene Dante ?Caller Phone Number [phone] ?Patient Name Lisa [name] ?Patient DOB [birth year] ?Call Type Message Only Information Provided ?Reason for Call Request to Schedule Office Appointment ?Initial Comment Caller states that she would like to schedule an appointment for a lot of pain that she is ?experiencing. ?Patient request to speak to RN No ?Additional Comment Caller declined triage and asked that someone in scheduling just give her a call back as soon as ?possible to try and get an appointment with either Dr. Patsy Lager or any other provider as long as ?she can be seen today. ?Disp. Time Disposition Final User ?11/22/2021 7:28:02 AM General Information Provided Yes Cherylynn Ridges ?Call Closed By: Cherylynn Ridges ?Transaction Date/Time: 11/22/2021 7:25:01 AM (ET) ?

## 2021-11-24 ENCOUNTER — Other Ambulatory Visit: Payer: Self-pay | Admitting: Family Medicine

## 2021-11-24 DIAGNOSIS — K219 Gastro-esophageal reflux disease without esophagitis: Secondary | ICD-10-CM

## 2021-11-28 ENCOUNTER — Other Ambulatory Visit: Payer: Self-pay | Admitting: Family Medicine

## 2021-11-28 DIAGNOSIS — I10 Essential (primary) hypertension: Secondary | ICD-10-CM

## 2021-11-29 ENCOUNTER — Other Ambulatory Visit: Payer: Self-pay | Admitting: Family Medicine

## 2021-11-30 DIAGNOSIS — H401132 Primary open-angle glaucoma, bilateral, moderate stage: Secondary | ICD-10-CM | POA: Diagnosis not present

## 2021-12-14 ENCOUNTER — Encounter (HOSPITAL_COMMUNITY): Payer: Self-pay

## 2021-12-14 ENCOUNTER — Ambulatory Visit (HOSPITAL_COMMUNITY)
Admission: RE | Admit: 2021-12-14 | Discharge: 2021-12-14 | Disposition: A | Discharge status: Home / Self Care | Payer: BC Managed Care – PPO | Source: Ambulatory Visit | Attending: Family Medicine | Admitting: Family Medicine

## 2021-12-14 VITALS — BP 118/86 | HR 109 | Temp 98.2°F | Resp 18

## 2021-12-14 DIAGNOSIS — R519 Headache, unspecified: Secondary | ICD-10-CM | POA: Diagnosis not present

## 2021-12-14 DIAGNOSIS — Z20822 Contact with and (suspected) exposure to covid-19: Secondary | ICD-10-CM | POA: Insufficient documentation

## 2021-12-14 LAB — SARS CORONAVIRUS 2 (TAT 6-24 HRS): SARS Coronavirus 2: NEGATIVE

## 2021-12-14 MED ORDER — KETOROLAC TROMETHAMINE 30 MG/ML IJ SOLN
30.0000 mg | Freq: Once | INTRAMUSCULAR | Status: AC
  Administered 2021-12-14: 30 mg via INTRAMUSCULAR

## 2021-12-14 MED ORDER — DEXAMETHASONE SODIUM PHOSPHATE 10 MG/ML IJ SOLN
INTRAMUSCULAR | Status: AC
  Filled 2021-12-14: qty 1

## 2021-12-14 MED ORDER — SUMATRIPTAN SUCCINATE 6 MG/0.5ML ~~LOC~~ SOLN
6.0000 mg | Freq: Once | SUBCUTANEOUS | Status: AC
  Administered 2021-12-14: 6 mg via SUBCUTANEOUS

## 2021-12-14 MED ORDER — METOCLOPRAMIDE HCL 5 MG/ML IJ SOLN
INTRAMUSCULAR | Status: AC
  Filled 2021-12-14: qty 2

## 2021-12-14 MED ORDER — METOCLOPRAMIDE HCL 5 MG/ML IJ SOLN
5.0000 mg | Freq: Once | INTRAMUSCULAR | Status: AC
  Administered 2021-12-14: 5 mg via INTRAMUSCULAR

## 2021-12-14 MED ORDER — KETOROLAC TROMETHAMINE 30 MG/ML IJ SOLN
INTRAMUSCULAR | Status: AC
  Filled 2021-12-14: qty 1

## 2021-12-14 MED ORDER — DEXAMETHASONE SODIUM PHOSPHATE 10 MG/ML IJ SOLN
10.0000 mg | Freq: Once | INTRAMUSCULAR | Status: AC
  Administered 2021-12-14: 10 mg via INTRAMUSCULAR

## 2021-12-14 MED ORDER — SUMATRIPTAN SUCCINATE 6 MG/0.5ML ~~LOC~~ SOLN
SUBCUTANEOUS | Status: AC
  Filled 2021-12-14: qty 0.5

## 2021-12-14 NOTE — Discharge Instructions (Addendum)
You have been given a shot of Toradol 30 mg today, with sumatriptan, dexamethasone, and metoclopramide. ? ? ?You have been swabbed for COVID, and the test will result in the next 24 hours. Our staff will call you if positive. If the test is positive, you should quarantine for 5 days.  ?

## 2021-12-14 NOTE — ED Provider Notes (Addendum)
[facility]    CSN: [id number] Arrival date & time: 12/14/21  1122      History   Chief Complaint Chief Complaint  Patient presents with   Migraine    Thought it was just a migraine.  It began Tuesday afternoon. The migraine meds I take give me temporary relief and its mainly on the left side of my face which has never occurred before. - Entered by patient    HPI Lisa [name] is a 51 y.o. female.    Migraine  Here with migraine/headache that began May 2.  First it was bilateral frontal area.  Imitrex 50 mg gave her partial relief, and she did take it twice in the first 24 hours and then again the next day.  No fever or chills.  The headache is now on her left side and her left ear feels full or clogged.  No sore throat or cough.  She does have some decrease in appetite and nausea.  She also has a stimulator for her rectum, to manage stool incontinence.  In the last 2 weeks she has still been having stool incontinence and loose stools.  She does see GI specialists  Past Medical History:  Diagnosis Date   Anemia    hx of   Anxiety 20 years ago   Depression 20 years ago   GERD (gastroesophageal reflux disease)    Heart murmur    Hyperlipidemia    Hypertension    history of htn, no meds now   Ulcer    x2   Vaginal Pap smear, abnormal     Patient Active Problem List   Diagnosis Date Noted   Elevated ferritin 08/27/2019   Pain in right toe(s) 04/07/2019   Fecal incontinence 01/15/2018   Pre-diabetes 02/14/2017   Left anterior cruciate ligament tear 08/03/2016   Acute medial meniscus tear of left knee 08/03/2016   Acute lateral meniscus tear of left knee 08/03/2016   History of abnormal cervical Pap smear 09/21/2013   Leukocytopenia 03/02/2013   S/P hip replacement 02/06/2013   Hyperlipidemia 10/13/2012   HTN (hypertension) 10/13/2012    Past Surgical History:  Procedure Laterality Date   ANTERIOR CRUCIATE LIGAMENT REPAIR Left 09/04/2016   Procedure:  LEFT KNEE MEDIAL COLLATERAL LIGAMENT REPAIR, ANTERIOR CRUCIATE LIGAMENT RECONSTRUCTION, MENISCAL DEBRIDEMENT VERSUS REPAIR;  Surgeon: Cammy Copa, MD;  Location: [facility];  Service: Orthopedics;  Laterality: Left;   ESOPHAGOGASTRODUODENOSCOPY ENDOSCOPY     at least 4   LAPAROSCOPIC APPENDECTOMY N/A 02/23/2015   Procedure: APPENDECTOMY LAPAROSCOPIC;  Surgeon: Gaynelle Adu, MD;  Location: [facility];  Service: General;  Laterality: N/A;   TOTAL HIP ARTHROPLASTY Left 02/06/2013   Procedure: LEFT TOTAL HIP ARTHROPLASTY ANTERIOR APPROACH;  Surgeon: Kathryne Hitch, MD;  Location: [facility];  Service: Orthopedics;  Laterality: Left;    OB History   No obstetric history on file.      Home Medications    Prior to Admission medications   Medication Sig Start Date End Date Taking? Authorizing Provider  latanoprost (XALATAN) 0.005 % ophthalmic solution Place 1 drop into both eyes daily. 08/02/21   [provider]  lisinopril (ZESTRIL) 10 MG tablet TAKE 1 TABLET BY MOUTH EVERY DAY 11/28/21   Copland, Gwenlyn Found, MD  MAGNESIUM PO Take 1 tablet by mouth daily.    [provider]  medroxyPROGESTERone (DEPO-PROVERA) 150 MG/ML injection Inject 1 mL (150 mg total) into the muscle once. 02/14/17   Copland, Gwenlyn Found, MD  metoCLOPramide (REGLAN) 10 MG tablet Take 1 tablet (10 mg total) by mouth every 6 (six) hours as needed for nausea (or headache). Patient not taking: Reported on 08/11/2021 03/18/18   Long, Arlyss Repress, MD  pantoprazole (PROTONIX) 40 MG tablet Take 1 tablet (40 mg total) by mouth daily. 11/22/21   Copland, Gwenlyn Found, MD  POTASSIUM PO Take 1 tablet by mouth daily.    [provider]  simvastatin (ZOCOR) 20 MG tablet TAKE 1 TABLET BY MOUTH EVERY DAY IN THE EVENING 09/25/21   Copland, Gwenlyn Found, MD  SUMAtriptan (IMITREX) 50 MG tablet TAKE 1 TABLET BY MOUTH EVERY 2 (TWO) HOURS AS NEEDED FOR MIGRAINE. MAX 100 MG IN 24 HOURS 11/16/21   Copland, Gwenlyn Found, MD  traMADol (ULTRAM) 50  MG tablet Take 1 tablet (50 mg total) by mouth 3 (three) times daily. 05/08/19   Copland, Gwenlyn Found, MD  traZODone (DESYREL) 50 MG tablet TAKE 1/2 TO 1 TABLET BY MOUTH AT BEDTIME AS NEEDED FOR SLEEP *MAY TAKE 2 AS NEEDED * 11/29/21   Copland, Gwenlyn Found, MD  valACYclovir (VALTREX) 1000 MG tablet Take 1 tablet (1,000 mg total) by mouth daily. 11/01/21   Copland, Gwenlyn Found, MD  Vitamin D, Ergocalciferol, (DRISDOL) 1.25 MG (50000 UNIT) CAPS capsule Take 1 capsule (50,000 Units total) by mouth every 7 (seven) days. 11/04/20   Copland, Gwenlyn Found, MD    Family History Family History  Adopted: Yes  Family history unknown: Yes    Social History Social History   Tobacco Use   Smoking status: Former    Packs/day: 0.25    Years: 20.00    Pack years: 5.00    Types: Cigarettes    Quit date: 08/13/2013    Years since quitting: 8.3   Smokeless tobacco: Never  Vaping Use   Vaping Use: Never used  Substance Use Topics   Alcohol use: Yes    Comment: few glasses of wine a week   Drug use: No     Allergies   Nsaids, Latex, and Nitroglycerin   Review of Systems Review of Systems   Physical Exam Triage Vital Signs ED Triage Vitals  Enc Vitals Group     BP 12/14/21 1206 118/86     Pulse Rate 12/14/21 1206 (!) 109     Resp 12/14/21 1206 18     Temp 12/14/21 1206 98.2 F (36.8 C)     Temp Source 12/14/21 1206 Oral     SpO2 12/14/21 1206 96 %     Weight --      Height --      Head Circumference --      Peak Flow --      Pain Score 12/14/21 1207 7     Pain Loc --      Pain Edu? --      Excl. in GC? --    No data found.  Updated Vital Signs BP 118/86 (BP Location: Right Arm)   Pulse (!) 109   Temp 98.2 F (36.8 C) (Oral)   Resp 18   SpO2 96%   Visual Acuity Right Eye Distance:   Left Eye Distance:   Bilateral Distance:    Right Eye Near:   Left Eye Near:    Bilateral Near:     Physical Exam Vitals reviewed.  Constitutional:      General: She is not in acute distress.     Appearance: She is not toxic-appearing.  HENT:     Right Ear:  Tympanic membrane and ear canal normal.     Left Ear: Tympanic membrane and ear canal normal.     Nose: Nose normal.     Mouth/Throat:     Mouth: Mucous membranes are moist.     Pharynx: No oropharyngeal exudate or posterior oropharyngeal erythema.  Eyes:     Extraocular Movements: Extraocular movements intact.     Conjunctiva/sclera: Conjunctivae normal.     Pupils: Pupils are equal, round, and reactive to light.     Funduscopic exam:    Right eye: No papilledema.        Left eye: No papilledema.  Cardiovascular:     Rate and Rhythm: Normal rate and regular rhythm.     Heart sounds: No murmur heard. Pulmonary:     Effort: Pulmonary effort is normal. No respiratory distress.     Breath sounds: No wheezing, rhonchi or rales.  Chest:     Chest wall: No tenderness.  Abdominal:     General: There is no distension.     Palpations: Abdomen is soft. There is no mass.     Tenderness: There is no abdominal tenderness. There is no guarding.  Musculoskeletal:     Cervical back: Neck supple.  Lymphadenopathy:     Cervical: No cervical adenopathy.  Skin:    Capillary Refill: Capillary refill takes less than 2 seconds.     Coloration: Skin is not jaundiced or pale.  Neurological:     General: No focal deficit present.     Mental Status: She is alert and oriented to person, place, and time.     Cranial Nerves: No cranial nerve deficit.     Sensory: No sensory deficit.     Motor: No weakness.     Coordination: Coordination normal.     Gait: Gait normal.     Deep Tendon Reflexes: Reflexes normal.  Psychiatric:        Behavior: Behavior normal.     UC Treatments / Results  Labs (all labs ordered are listed, but only abnormal results are displayed) Labs Reviewed  SARS CORONAVIRUS 2 (TAT 6-24 HRS)    EKG   Radiology No results found.  Procedures Procedures (including critical care time)  Medications Ordered in  UC Medications  ketorolac (TORADOL) 30 MG/ML injection 30 mg (has no administration in time range)  metoCLOPramide (REGLAN) injection 5 mg (has no administration in time range)  dexamethasone (DECADRON) injection 10 mg (has no administration in time range)  SUMAtriptan (IMITREX) injection 6 mg (has no administration in time range)    Initial Impression / Assessment and Plan / UC Course  I have reviewed the triage vital signs and the nursing notes.  Pertinent labs & imaging results that were available during my care of the patient were reviewed by me and considered in my medical decision making (see chart for details).     Possibly complex migraine.  We will swab for COVID, to make sure the that is not part of her symptom complex.  She is given our headache injection protocol here.  She will contact her GI specialist Final Clinical Impressions(s) / UC Diagnoses   Final diagnoses:  Intractable headache, unspecified chronicity pattern, unspecified headache type     Discharge Instructions      You have been given a shot of Toradol 30 mg today, with sumatriptan, dexamethasone, and metoclopramide.   You have been swabbed for COVID, and the test will result in the next 24 hours. Our staff will call you  if positive. If the test is positive, you should quarantine for 5 days.      ED Prescriptions   None    PDMP not reviewed this encounter.   Zenia Resides, MD 12/14/21 1243    Zenia Resides, MD 12/14/21 [phone]

## 2021-12-14 NOTE — ED Triage Notes (Signed)
Pt presents with chronic migraine; current one started yesterday and she said she only got mild relief with prescribed medication. ?

## 2021-12-15 ENCOUNTER — Encounter: Payer: Self-pay | Admitting: Family Medicine

## 2021-12-15 DIAGNOSIS — K219 Gastro-esophageal reflux disease without esophagitis: Secondary | ICD-10-CM

## 2021-12-15 MED ORDER — PANTOPRAZOLE SODIUM 40 MG PO TBEC: 40.0000 mg | DELAYED_RELEASE_TABLET | Freq: Every day | ORAL | 8 refills | Status: DC

## 2021-12-22 ENCOUNTER — Other Ambulatory Visit: Payer: Self-pay | Admitting: Family Medicine

## 2022-01-24 ENCOUNTER — Ambulatory Visit (INDEPENDENT_AMBULATORY_CARE_PROVIDER_SITE_OTHER): Payer: BC Managed Care – PPO

## 2022-01-24 DIAGNOSIS — Z3042 Encounter for surveillance of injectable contraceptive: Secondary | ICD-10-CM

## 2022-01-24 MED ORDER — MEDROXYPROGESTERONE ACETATE 150 MG/ML IM SUSP
150.0000 mg | Freq: Once | INTRAMUSCULAR | Status: AC
  Administered 2022-01-24: 150 mg via INTRAMUSCULAR

## 2022-01-24 NOTE — Progress Notes (Signed)
Patient here today for Depo-Provera per Dr. Copland  Depo Provera 1mL given in right upper outer quadrarant. Patient tolerated well.  Pt next injection due 

## 2022-02-20 ENCOUNTER — Other Ambulatory Visit: Payer: Self-pay | Admitting: Family Medicine

## 2022-02-20 DIAGNOSIS — E7849 Other hyperlipidemia: Secondary | ICD-10-CM

## 2022-03-16 ENCOUNTER — Other Ambulatory Visit: Payer: Self-pay | Admitting: Family Medicine

## 2022-03-16 DIAGNOSIS — E7849 Other hyperlipidemia: Secondary | ICD-10-CM

## 2022-04-05 DIAGNOSIS — H401132 Primary open-angle glaucoma, bilateral, moderate stage: Secondary | ICD-10-CM | POA: Diagnosis not present

## 2022-04-18 ENCOUNTER — Ambulatory Visit (INDEPENDENT_AMBULATORY_CARE_PROVIDER_SITE_OTHER): Payer: BC Managed Care – PPO

## 2022-04-18 DIAGNOSIS — Z3042 Encounter for surveillance of injectable contraceptive: Secondary | ICD-10-CM | POA: Diagnosis not present

## 2022-04-18 MED ORDER — MEDROXYPROGESTERONE ACETATE 150 MG/ML IM SUSP
150.0000 mg | Freq: Once | INTRAMUSCULAR | Status: AC
  Administered 2022-04-18: 150 mg via INTRAMUSCULAR

## 2022-04-18 NOTE — Progress Notes (Signed)
Patient here today for Depo-Provera per Dr. Patsy Lager  Depo Provera 78mL given in right upper outer quadrarant. Patient tolerated well.  Pt next injection due

## 2022-04-30 ENCOUNTER — Other Ambulatory Visit: Payer: Self-pay | Admitting: Family Medicine

## 2022-04-30 DIAGNOSIS — A609 Anogenital herpesviral infection, unspecified: Secondary | ICD-10-CM

## 2022-05-22 ENCOUNTER — Other Ambulatory Visit: Payer: Self-pay | Admitting: Family Medicine

## 2022-05-22 DIAGNOSIS — I10 Essential (primary) hypertension: Secondary | ICD-10-CM

## 2022-06-06 ENCOUNTER — Encounter: Payer: Self-pay | Admitting: Family Medicine

## 2022-06-06 DIAGNOSIS — R300 Dysuria: Secondary | ICD-10-CM

## 2022-06-06 MED ORDER — CEPHALEXIN 500 MG PO CAPS: 500.0000 mg | ORAL_CAPSULE | Freq: Two times a day (BID) | ORAL | 0 refills | Status: DC

## 2022-06-06 NOTE — Addendum Note (Signed)
Addended by: Lamar Blinks C on: 06/06/2022 09:17 PM   Modules accepted: Orders

## 2022-06-07 ENCOUNTER — Other Ambulatory Visit (INDEPENDENT_AMBULATORY_CARE_PROVIDER_SITE_OTHER): Payer: BC Managed Care – PPO

## 2022-06-07 ENCOUNTER — Encounter: Payer: Self-pay | Admitting: Family Medicine

## 2022-06-07 DIAGNOSIS — R300 Dysuria: Secondary | ICD-10-CM | POA: Diagnosis not present

## 2022-06-07 DIAGNOSIS — R3129 Other microscopic hematuria: Secondary | ICD-10-CM

## 2022-06-07 LAB — POCT URINALYSIS DIP (MANUAL ENTRY)
Glucose, UA: NEGATIVE mg/dL
Ketones, POC UA: NEGATIVE mg/dL
Leukocytes, UA: NEGATIVE
Nitrite, UA: NEGATIVE
Protein Ur, POC: NEGATIVE mg/dL
Spec Grav, UA: 1.025 (ref 1.010–1.025)
Urobilinogen, UA: 0.2 E.U./dL
pH, UA: 6 (ref 5.0–8.0)

## 2022-06-07 NOTE — Addendum Note (Signed)
Addended by: Kelle Darting A on: 06/07/2022 11:44 AM   Modules accepted: Orders

## 2022-06-09 LAB — URINE CULTURE
MICRO NUMBER:: 14105097
Result:: NO GROWTH
SPECIMEN QUALITY:: ADEQUATE

## 2022-06-09 NOTE — Progress Notes (Unsigned)
[facility] [address], [address] [place], [place] [address] [phone]  Date:  06/13/2022   Name:  Lisa [name]   DOB:  01/27/1971   MRN:  [id number]  PCP:  Pearline Cables, MD    Chief Complaint: No chief complaint on file.   History of Present Illness:  Lisa [name] is a 51 y.o. very pleasant female patient who presents with the following:  Patient seen today for follow-up She came in last week with concern of possible UTI, left a urine sample Culture came back negative for infection.  A urine micro was ordered but was not able to be completed.  We can recheck urine for blood today I did send in a prescription for cephalexin  Pt with history of fecal incontinence now with sacral neuromodulator, HTN, hyperlipidemia, pre-diabetes, left hip replacement due to AVN, hemochromatosis managed by hematology, B12 deficiency Kiri has wanted to stay on Depo-Provera as she has been concerned about pregnancy prevention. An FSH level for her in March did not show menopause so she continued her injection  Flu vaccine Mammogram Pap completed last year, negative Results for orders placed or performed in visit on 06/07/22  Urine Culture   Specimen: Urine  Result Value Ref Range   MICRO NUMBER: [id number]    SPECIMEN QUALITY: Adequate    Sample Source NOT GIVEN    STATUS: FINAL    Result: No Growth   POCT urinalysis dipstick  Result Value Ref Range   Color, UA yellow yellow   Clarity, UA clear clear   Glucose, UA negative negative mg/dL   Bilirubin, UA small (A) negative   Ketones, POC UA negative negative mg/dL   Spec Grav, UA 1.607 3.710 - 1.025   Blood, UA small (A) negative   pH, UA 6.0 5.0 - 8.0   Protein Ur, POC negative negative mg/dL   Urobilinogen, UA 0.2 0.2 or 1.0 E.U./dL   Nitrite, UA Negative Negative   Leukocytes, UA Negative Negative     Patient Active Problem List   Diagnosis Date Noted   Elevated  ferritin 08/27/2019   Pain in right toe(s) 04/07/2019   Fecal incontinence 01/15/2018   Pre-diabetes 02/14/2017   Left anterior cruciate ligament tear 08/03/2016   Acute medial meniscus tear of left knee 08/03/2016   Acute lateral meniscus tear of left knee 08/03/2016   History of abnormal cervical Pap smear 09/21/2013   Leukocytopenia 03/02/2013   S/P hip replacement 02/06/2013   Hyperlipidemia 10/13/2012   HTN (hypertension) 10/13/2012    Past Medical History:  Diagnosis Date   Anemia    hx of   Anxiety 20 years ago   Depression 20 years ago   GERD (gastroesophageal reflux disease)    Heart murmur    Hyperlipidemia    Hypertension    history of htn, no meds now   Ulcer    x2   Vaginal Pap smear, abnormal     Past Surgical History:  Procedure Laterality Date   ANTERIOR CRUCIATE LIGAMENT REPAIR Left 09/04/2016   Procedure: LEFT KNEE MEDIAL COLLATERAL LIGAMENT REPAIR, ANTERIOR CRUCIATE LIGAMENT RECONSTRUCTION, MENISCAL DEBRIDEMENT VERSUS REPAIR;  Surgeon: Cammy Copa, MD;  Location: [facility] OR;  Service: Orthopedics;  Laterality: Left;   ESOPHAGOGASTRODUODENOSCOPY ENDOSCOPY     at least 4   LAPAROSCOPIC APPENDECTOMY N/A 02/23/2015   Procedure: APPENDECTOMY LAPAROSCOPIC;  Surgeon: Gaynelle Adu, MD;  Location: [facility] ORS;  Service: General;  Laterality: N/A;   TOTAL HIP ARTHROPLASTY Left 02/06/2013   Procedure: LEFT TOTAL HIP ARTHROPLASTY ANTERIOR APPROACH;  Surgeon: Mcarthur Rossetti, MD;  Location: [facility] ORS;  Service: Orthopedics;  Laterality: Left;    Social History   Tobacco Use   Smoking status: Former    Packs/day: 0.25    Years: 20.00    Total pack years: 5.00    Types: Cigarettes    Quit date: 08/13/2013    Years since quitting: 8.8   Smokeless tobacco: Never  Vaping Use   Vaping Use: Never used  Substance Use Topics   Alcohol use: Yes    Comment: few glasses of wine a week   Drug use: No    Family History  Adopted: Yes  Family history unknown: Yes     Allergies  Allergen Reactions   Nsaids Other (See Comments)    ulcers   Latex     Surgical tape not bandaid    Nitroglycerin     Pt took Nitroglycerin patches - pt stated, "Made my skin bright red when I changed my patch; it made my foot itch and burn"    Medication list has been reviewed and updated.  Current Outpatient Medications on File Prior to Visit  Medication Sig Dispense Refill   cephALEXin (KEFLEX) 500 MG capsule Take 1 capsule (500 mg total) by mouth 2 (two) times daily. 14 capsule 0   latanoprost (XALATAN) 0.005 % ophthalmic solution Place 1 drop into both eyes daily.     lisinopril (ZESTRIL) 10 MG tablet TAKE 1 TABLET BY MOUTH EVERY DAY 30 tablet 8   MAGNESIUM PO Take 1 tablet by mouth daily.     medroxyPROGESTERone (DEPO-PROVERA) 150 MG/ML injection Inject 1 mL (150 mg total) into the muscle once. 1 mL 0   metoCLOPramide (REGLAN) 10 MG tablet Take 1 tablet (10 mg total) by mouth every 6 (six) hours as needed for nausea (or headache). (Patient not taking: Reported on 08/11/2021) 30 tablet 0   pantoprazole (PROTONIX) 40 MG tablet Take 1 tablet (40 mg total) by mouth daily. 30 tablet 8   POTASSIUM PO Take 1 tablet by mouth daily.     simvastatin (ZOCOR) 20 MG tablet TAKE 1 TABLET BY MOUTH EVERY DAY IN THE EVENING 90 tablet 1   SUMAtriptan (IMITREX) 50 MG tablet TAKE 1 TABLET BY MOUTH EVERY 2 (TWO) HOURS AS NEEDED FOR MIGRAINE. MAX 100 MG IN 24 HOURS 9 tablet 4   traMADol (ULTRAM) 50 MG tablet Take 1 tablet (50 mg total) by mouth 3 (three) times daily. 90 tablet 1   traZODone (DESYREL) 50 MG tablet TAKE 1/2 TO 1 TABLET BY MOUTH AT BEDTIME AS NEEDED FOR SLEEP *MAY TAKE 2 AS NEEDED * 180 tablet 4   valACYclovir (VALTREX) 1000 MG tablet TAKE 1 TABLET BY MOUTH EVERY DAY 30 tablet 5   Vitamin D, Ergocalciferol, (DRISDOL) 1.25 MG (50000 UNIT) CAPS capsule Take 1 capsule (50,000 Units total) by mouth every 7 (seven) days. 12 capsule 0   No current facility-administered  medications on file prior to visit.    Review of Systems:  As per HPI- otherwise negative.   Physical Examination: There were no vitals filed for this visit. There were no vitals filed for this visit. There is no height or weight on file to calculate BMI. Ideal Body Weight:    GEN: no acute distress. HEENT: Atraumatic, Normocephalic.  Ears and Nose: No external deformity. CV: RRR, No M/G/R. No JVD. No thrill. No  extra heart sounds. PULM: CTA B, no wheezes, crackles, rhonchi. No retractions. No resp. distress. No accessory muscle use. ABD: S, NT, ND, +BS. No rebound. No HSM. EXTR: No c/c/e PSYCH: Normally interactive. Conversant.    Assessment and Plan: ***  Signed Lamar Blinks, MD

## 2022-06-11 ENCOUNTER — Encounter: Payer: Self-pay | Admitting: Family Medicine

## 2022-06-13 ENCOUNTER — Ambulatory Visit: Payer: BC Managed Care – PPO | Admitting: Family Medicine

## 2022-06-13 VITALS — BP 110/60 | HR 105 | Temp 97.6°F | Resp 18 | Ht 68.0 in | Wt 186.2 lb

## 2022-06-13 DIAGNOSIS — I10 Essential (primary) hypertension: Secondary | ICD-10-CM

## 2022-06-13 DIAGNOSIS — R55 Syncope and collapse: Secondary | ICD-10-CM | POA: Diagnosis not present

## 2022-06-13 DIAGNOSIS — R3129 Other microscopic hematuria: Secondary | ICD-10-CM | POA: Diagnosis not present

## 2022-06-13 DIAGNOSIS — R7303 Prediabetes: Secondary | ICD-10-CM | POA: Diagnosis not present

## 2022-06-13 NOTE — Patient Instructions (Addendum)
It was good to see you again today!  I think you had an episode of vaso-vagal syncope (fainting) which can occur with defecation- be careful not to strain which can trigger this Otherwise I will be in touch with your labs asap  Recommend flu shot!  Mammo may also be due

## 2022-06-14 ENCOUNTER — Encounter: Payer: Self-pay | Admitting: Family Medicine

## 2022-06-14 DIAGNOSIS — R300 Dysuria: Secondary | ICD-10-CM

## 2022-06-14 DIAGNOSIS — R202 Paresthesia of skin: Secondary | ICD-10-CM

## 2022-06-14 DIAGNOSIS — R109 Unspecified abdominal pain: Secondary | ICD-10-CM

## 2022-06-14 DIAGNOSIS — R159 Full incontinence of feces: Secondary | ICD-10-CM | POA: Diagnosis not present

## 2022-06-14 DIAGNOSIS — R7303 Prediabetes: Secondary | ICD-10-CM

## 2022-06-14 LAB — HEMOGLOBIN A1C: Hgb A1c MFr Bld: 6.8 % — ABNORMAL HIGH (ref 4.6–6.5)

## 2022-06-14 LAB — BASIC METABOLIC PANEL
BUN: 10 mg/dL (ref 6–23)
CO2: 24 mEq/L (ref 19–32)
Calcium: 9.4 mg/dL (ref 8.4–10.5)
Chloride: 101 mEq/L (ref 96–112)
Creatinine, Ser: 0.5 mg/dL (ref 0.40–1.20)
GFR: 109.82 mL/min (ref >60.00)
Glucose, Bld: 118 mg/dL — ABNORMAL HIGH (ref 70–99)
Potassium: 4.6 mEq/L (ref 3.5–5.1)
Sodium: 136 mEq/L (ref 135–145)

## 2022-06-14 LAB — CBC
HCT: 37.8 % (ref 36.0–46.0)
Hemoglobin: 12.3 g/dL (ref 12.0–15.0)
MCHC: 32.4 g/dL (ref 30.0–36.0)
MCV: 90.4 fl (ref 78.0–100.0)
Platelets: 282 10*3/uL (ref 150.0–400.0)
RBC: 4.18 Mil/uL (ref 3.87–5.11)
RDW: 13.4 % (ref 11.5–15.5)
WBC: 5.6 10*3/uL (ref 4.0–10.5)

## 2022-06-14 LAB — URINALYSIS, MICROSCOPIC ONLY

## 2022-06-14 LAB — TSH: TSH: 1.58 u[IU]/mL (ref 0.35–5.50)

## 2022-06-15 ENCOUNTER — Other Ambulatory Visit: Payer: BC Managed Care – PPO

## 2022-06-15 DIAGNOSIS — R300 Dysuria: Secondary | ICD-10-CM | POA: Diagnosis not present

## 2022-06-15 MED ORDER — METFORMIN HCL 500 MG PO TABS: 500.0000 mg | ORAL_TABLET | Freq: Every day | ORAL | 3 refills | Status: DC

## 2022-06-15 NOTE — Addendum Note (Signed)
Addended by: Lamar Blinks C on: 06/15/2022 04:22 PM   Modules accepted: Orders

## 2022-06-16 LAB — URINE CULTURE
MICRO NUMBER:: 14141559
Result:: NO GROWTH
SPECIMEN QUALITY:: ADEQUATE

## 2022-06-19 ENCOUNTER — Ambulatory Visit (HOSPITAL_BASED_OUTPATIENT_CLINIC_OR_DEPARTMENT_OTHER)
Admission: RE | Admit: 2022-06-19 | Discharge: 2022-06-19 | Disposition: A | Discharge status: Home / Self Care | Payer: BC Managed Care – PPO | Source: Ambulatory Visit | Attending: Family Medicine | Admitting: Family Medicine

## 2022-06-19 DIAGNOSIS — R109 Unspecified abdominal pain: Secondary | ICD-10-CM | POA: Diagnosis not present

## 2022-06-19 DIAGNOSIS — N200 Calculus of kidney: Secondary | ICD-10-CM | POA: Diagnosis not present

## 2022-06-21 ENCOUNTER — Encounter: Payer: Self-pay | Admitting: Family Medicine

## 2022-06-22 ENCOUNTER — Other Ambulatory Visit: Payer: Self-pay | Admitting: Family Medicine

## 2022-06-22 DIAGNOSIS — K824 Cholesterolosis of gallbladder: Secondary | ICD-10-CM

## 2022-06-22 DIAGNOSIS — R3129 Other microscopic hematuria: Secondary | ICD-10-CM

## 2022-06-22 DIAGNOSIS — N200 Calculus of kidney: Secondary | ICD-10-CM

## 2022-06-25 ENCOUNTER — Encounter: Payer: Self-pay | Admitting: Family Medicine

## 2022-07-03 ENCOUNTER — Ambulatory Visit (INDEPENDENT_AMBULATORY_CARE_PROVIDER_SITE_OTHER): Payer: BC Managed Care – PPO

## 2022-07-03 DIAGNOSIS — Z3042 Encounter for surveillance of injectable contraceptive: Secondary | ICD-10-CM | POA: Diagnosis not present

## 2022-07-03 MED ORDER — MEDROXYPROGESTERONE ACETATE 150 MG/ML IM SUSP
150.0000 mg | Freq: Once | INTRAMUSCULAR | Status: AC
  Administered 2022-07-03: 150 mg via INTRAMUSCULAR

## 2022-07-03 NOTE — Progress Notes (Signed)
Lisa [name] is a 51 y.o. female presents to the office today for Depo Provera injection, per Dr Patsy Lager  Date last pap: 2022- was negative with negative HPV  Last Depo-Provera: 04/18/22- Okay to admin 1 day early per DOD.  Side Effects if any: None. Serum HCG indicated? No. Depo-Provera 150 mg administered IM Right Upper outer quadrant. Next appointment due Feb 6-March 6. Nurse visit scheduled no- pt understands the dates of her window of next injection, she will call back to schedule.   DOD: Dr Carmelia Roller

## 2022-07-10 DIAGNOSIS — K219 Gastro-esophageal reflux disease without esophagitis: Secondary | ICD-10-CM | POA: Diagnosis not present

## 2022-07-10 DIAGNOSIS — R1013 Epigastric pain: Secondary | ICD-10-CM | POA: Diagnosis not present

## 2022-07-12 ENCOUNTER — Ambulatory Visit (HOSPITAL_BASED_OUTPATIENT_CLINIC_OR_DEPARTMENT_OTHER)
Admission: RE | Admit: 2022-07-12 | Discharge: 2022-07-12 | Disposition: A | Discharge status: Home / Self Care | Payer: BC Managed Care – PPO | Source: Ambulatory Visit | Attending: Family Medicine | Admitting: Family Medicine

## 2022-07-12 DIAGNOSIS — H52223 Regular astigmatism, bilateral: Secondary | ICD-10-CM | POA: Diagnosis not present

## 2022-07-12 DIAGNOSIS — H5213 Myopia, bilateral: Secondary | ICD-10-CM | POA: Diagnosis not present

## 2022-07-12 DIAGNOSIS — K824 Cholesterolosis of gallbladder: Secondary | ICD-10-CM | POA: Insufficient documentation

## 2022-07-12 DIAGNOSIS — H524 Presbyopia: Secondary | ICD-10-CM | POA: Diagnosis not present

## 2022-07-12 DIAGNOSIS — K838 Other specified diseases of biliary tract: Secondary | ICD-10-CM | POA: Diagnosis not present

## 2022-07-16 ENCOUNTER — Encounter: Payer: Self-pay | Admitting: Family Medicine

## 2022-07-16 ENCOUNTER — Telehealth: Payer: Self-pay | Admitting: Family Medicine

## 2022-07-16 ENCOUNTER — Other Ambulatory Visit (HOSPITAL_COMMUNITY): Payer: Self-pay | Admitting: Gastroenterology

## 2022-07-16 DIAGNOSIS — R1013 Epigastric pain: Secondary | ICD-10-CM

## 2022-07-16 DIAGNOSIS — K828 Other specified diseases of gallbladder: Secondary | ICD-10-CM

## 2022-07-16 NOTE — Telephone Encounter (Signed)
Discussed with pt yesterday, 12/3.  Possible GB polyp vs mass,  will refer to general surgery.  She cannot have recommended MRI due to her spinal cord stimulator  Called her GI doc, Dr Bosie Clos with [facility] - left number for him to call back.  Dr Kathie Rhodes is seeing this patient in the office on 12/14  Ordered general surgery consult for gallbladder finding

## 2022-07-17 ENCOUNTER — Ambulatory Visit (HOSPITAL_COMMUNITY)
Admission: RE | Admit: 2022-07-17 | Discharge: 2022-07-17 | Disposition: A | Discharge status: Home / Self Care | Payer: BC Managed Care – PPO | Source: Ambulatory Visit | Attending: Gastroenterology | Admitting: Gastroenterology

## 2022-07-17 DIAGNOSIS — R1013 Epigastric pain: Secondary | ICD-10-CM | POA: Insufficient documentation

## 2022-07-17 DIAGNOSIS — K838 Other specified diseases of biliary tract: Secondary | ICD-10-CM | POA: Diagnosis not present

## 2022-07-17 DIAGNOSIS — K8689 Other specified diseases of pancreas: Secondary | ICD-10-CM | POA: Diagnosis not present

## 2022-07-17 MED ORDER — IOHEXOL 300 MG/ML  SOLN
100.0000 mL | Freq: Once | INTRAMUSCULAR | Status: AC | PRN
  Administered 2022-07-17: 100 mL via INTRAVENOUS

## 2022-07-18 DIAGNOSIS — R1013 Epigastric pain: Secondary | ICD-10-CM | POA: Diagnosis not present

## 2022-07-24 DIAGNOSIS — K319 Disease of stomach and duodenum, unspecified: Secondary | ICD-10-CM | POA: Diagnosis not present

## 2022-07-24 DIAGNOSIS — K317 Polyp of stomach and duodenum: Secondary | ICD-10-CM | POA: Diagnosis not present

## 2022-07-24 DIAGNOSIS — K219 Gastro-esophageal reflux disease without esophagitis: Secondary | ICD-10-CM | POA: Diagnosis not present

## 2022-07-24 DIAGNOSIS — K2970 Gastritis, unspecified, without bleeding: Secondary | ICD-10-CM | POA: Diagnosis not present

## 2022-07-24 DIAGNOSIS — R1013 Epigastric pain: Secondary | ICD-10-CM | POA: Diagnosis not present

## 2022-07-31 ENCOUNTER — Ambulatory Visit: Payer: Self-pay | Admitting: Surgery

## 2022-07-31 DIAGNOSIS — K824 Cholesterolosis of gallbladder: Secondary | ICD-10-CM

## 2022-07-31 DIAGNOSIS — D376 Neoplasm of uncertain behavior of liver, gallbladder and bile ducts: Secondary | ICD-10-CM | POA: Diagnosis not present

## 2022-07-31 DIAGNOSIS — K828 Other specified diseases of gallbladder: Secondary | ICD-10-CM | POA: Diagnosis not present

## 2022-07-31 NOTE — H&P (Signed)
History of Present Illness: Lisa [name] is a 51 y.o. female who was referred to me for evaluation of a gallbladder polyp.  She underwent a noncontrast CT scan for workup of kidney stones, which showed a polyp in the fundus of the gallbladder.  She then had an RUQ [place] on 11/30, which again showed an 11 mm mass in the fundus of the gallbladder corresponding to the CT findings.  She cannot have an MRI due to the presence of a spinal cord stimulator, so she underwent a CT scan with contrast on 12/5, which again showed similar findings.  She was also noted to have mild dilation of the common bile duct to 9 mm, which has been present on prior imaging.  She was referred for surgical evaluation.   She has some epigastric abdominal pain but has a known history of ulcers and is on a PPI.  She does not have any right upper quadrant pain or symptoms consistent with biliary colic.  Her only prior abdominal surgery is a laparoscopic appendectomy.  She is adopted and does not know her biological family history.     Review of Systems: A complete review of systems was obtained from the patient.  I have reviewed this information and discussed as appropriate with the patient.  See HPI as well for other ROS.       Medical History: Past Medical History Past Medical History: Diagnosis Date  Anemia    Glaucoma (increased eye pressure)        There is no problem list on file for this patient.     Past Surgical History Past Surgical History: Procedure Laterality Date  APPENDECTOMY      JOINT REPLACEMENT          Allergies Allergies Allergen Reactions  Nsaids (Non-Steroidal Anti-Inflammatory Drug) Other (See Comments)     ulcers      Current Outpatient Medications on File Prior to Visit Medication Sig Dispense Refill  pantoprazole (PROTONIX) 40 MG DR tablet Take 40 mg by mouth once daily      SUMAtriptan (IMITREX) 50 MG tablet Take by mouth every 2 (two) hours as needed      latanoprost (XALATAN)  0.005 % ophthalmic solution INSTILL 1 DROP INTO EACH EYE ONCE DAILY IN THE EVENING      lisinopriL (ZESTRIL) 10 MG tablet Take 10 mg by mouth once daily      magnesium 250 mg Tab Take by mouth once daily      metFORMIN (GLUCOPHAGE) 500 MG tablet TAKE 1 TABLET (500 MG TOTAL) BY MOUTH DAILY.      simvastatin (ZOCOR) 20 MG tablet Take 20 mg by mouth every evening      traZODone (DESYREL) 50 MG tablet TAKE 1/2 TO 1 TABLET BY MOUTH AT BEDTIME AS NEEDED FOR SLEEP *MAY TAKE 2 AS NEEDED*       No current facility-administered medications on file prior to visit.     Family History Family History Adopted: Yes      Social History   Tobacco Use Smoking Status Former  Types: Cigarettes  Quit date: 2014  Years since quitting: 9.9 Smokeless Tobacco Never     Social History Social History    Socioeconomic History  Marital status: Single Tobacco Use  Smoking status: Former     Types: Cigarettes     Quit date: 2014     Years since quitting: 9.9  Smokeless tobacco: Never Substance and Sexual Activity  Alcohol use: Yes  Drug use: Never  Objective:     Vitals:   07/31/22 0922 BP: 118/66 Pulse: 100 Temp: 36.6 C (97.8 F) SpO2: 98% Weight: 83.1 kg (183 lb 3.2 oz) Height: 172.7 cm (5\' 8" )   Body mass index is 27.86 kg/m.   Physical Exam Vitals reviewed.  Constitutional:      Appearance: Normal appearance.  Eyes:     General: No scleral icterus.    Conjunctiva/sclera: Conjunctivae normal.  Cardiovascular:     Rate and Rhythm: Normal rate and regular rhythm.  Pulmonary:     Effort: Pulmonary effort is normal. No respiratory distress.     Breath sounds: Normal breath sounds. No wheezing.  Abdominal:     General: There is no distension.     Palpations: Abdomen is soft.     Tenderness: There is no abdominal tenderness.     Comments: Well-healed laparoscopic surgical scars.  Musculoskeletal:        General: Normal range of motion.  Skin:    General: Skin is warm  and dry.     Coloration: Skin is not jaundiced.  Neurological:     General: No focal deficit present.     Mental Status: She is alert and oriented to person, place, and time.  Psychiatric:        Mood and Affect: Mood normal.        Behavior: Behavior normal.            Labs, Imaging and Diagnostic Testing: CT abd/pelvis 07/17/22: IMPRESSION: 1. Similar dilation of the common bile duct measuring up to 9 mm at the head of the pancreas extending to the ampulla, dating back to February 23, 2015. However, there is now slightly less tapering of the distal duct through the pancreatic head to the level of the ampulla. Nonspecific and favored within normal limits for this patient given the relative chronic stability. However would suggest correlation with laboratory values to exclude pathologic biliary process/obstruction and if clinically indicated consider evaluation by MRCP with and without contrast. 2. Solid enhancing 12 mm lesion in the gallbladder fundus, primary differential considerations include gallbladder carcinoma or gallbladder polyp. Suggest surgical consult.     Assessment and Plan: Diagnoses and all orders for this visit:   Gallbladder mass -     Hepatic Function Panel (HFP); Future -     CA 19-9 - [facility]       This is a 51 yo female referred for evaluation of a gallbladder polyp dentally noted on imaging workup for kidney stones.  I have personally reviewed referral notes, labs, and imaging.  She has a 1.2 cm polyp in the fundus of the gallbladder on both ultrasound and CT, which is enhancing on CT scan.  There are no liver masses.  I discussed that the majority of gallbladder polyps are benign, however some carry a risk of malignant transformation and cholecystectomy is recommended for polyps larger than 1 cm.  I recommended laparoscopic cholecystectomy.  Based on intraoperative findings if there is any concern for malignancy, a frozen section can be performed and if  malignancy is confirmed, I would recommend an oncologic resection at that same operation which would include an open partial central hepatectomy and portal lymph node dissection.  I discussed all this with the patient and reviewed the benefits and risks of surgery.  She expressed understanding and agrees to proceed with surgery.  She will be contacted to schedule a surgery date.  All questions were answered.  We will check baseline LFTs today as  well as a CA 19-9.  Sophronia Simas, MD [facility] Surgery General, Hepatobiliary and Pancreatic Surgery 07/31/22 11:48 AM

## 2022-07-31 NOTE — H&P (View-Only) (Signed)
History of Present Illness: Lisa [name] is a 51 y.o. female who was referred to me for evaluation of a gallbladder polyp.  She underwent a noncontrast CT scan for workup of kidney stones, which showed a polyp in the fundus of the gallbladder.  She then had an RUQ US on 11/30, which again showed an 11 mm mass in the fundus of the gallbladder corresponding to the CT findings.  She cannot have an MRI due to the presence of a spinal cord stimulator, so she underwent a CT scan with contrast on 12/5, which again showed similar findings.  She was also noted to have mild dilation of the common bile duct to 9 mm, which has been present on prior imaging.  She was referred for surgical evaluation.   She has some epigastric abdominal pain but has a known history of ulcers and is on a PPI.  She does not have any right upper quadrant pain or symptoms consistent with biliary colic.  Her only prior abdominal surgery is a laparoscopic appendectomy.  She is adopted and does not know her biological family history.     Review of Systems: A complete review of systems was obtained from the patient.  I have reviewed this information and discussed as appropriate with the patient.  See HPI as well for other ROS.       Medical History: Past Medical History Past Medical History: Diagnosis Date  Anemia    Glaucoma (increased eye pressure)        There is no problem list on file for this patient.     Past Surgical History Past Surgical History: Procedure Laterality Date  APPENDECTOMY      JOINT REPLACEMENT          Allergies Allergies Allergen Reactions  Nsaids (Non-Steroidal Anti-Inflammatory Drug) Other (See Comments)     ulcers      Current Outpatient Medications on File Prior to Visit Medication Sig Dispense Refill  pantoprazole (PROTONIX) 40 MG DR tablet Take 40 mg by mouth once daily      SUMAtriptan (IMITREX) 50 MG tablet Take by mouth every 2 (two) hours as needed      latanoprost (XALATAN)  0.005 % ophthalmic solution INSTILL 1 DROP INTO EACH EYE ONCE DAILY IN THE EVENING      lisinopriL (ZESTRIL) 10 MG tablet Take 10 mg by mouth once daily      magnesium 250 mg Tab Take by mouth once daily      metFORMIN (GLUCOPHAGE) 500 MG tablet TAKE 1 TABLET (500 MG TOTAL) BY MOUTH DAILY.      simvastatin (ZOCOR) 20 MG tablet Take 20 mg by mouth every evening      traZODone (DESYREL) 50 MG tablet TAKE 1/2 TO 1 TABLET BY MOUTH AT BEDTIME AS NEEDED FOR SLEEP *MAY TAKE 2 AS NEEDED*       No current facility-administered medications on file prior to visit.     Family History Family History Adopted: Yes      Social History   Tobacco Use Smoking Status Former  Types: Cigarettes  Quit date: 2014  Years since quitting: 9.9 Smokeless Tobacco Never     Social History Social History    Socioeconomic History  Marital status: Single Tobacco Use  Smoking status: Former     Types: Cigarettes     Quit date: 2014     Years since quitting: 9.9  Smokeless tobacco: Never Substance and Sexual Activity  Alcohol use: Yes  Drug use: Never        Objective:     Vitals:   07/31/22 0922 BP: 118/66 Pulse: 100 Temp: 36.6 C (97.8 F) SpO2: 98% Weight: 83.1 kg (183 lb 3.2 oz) Height: 172.7 cm (5' 8")   Body mass index is 27.86 kg/m.   Physical Exam Vitals reviewed.  Constitutional:      Appearance: Normal appearance.  Eyes:     General: No scleral icterus.    Conjunctiva/sclera: Conjunctivae normal.  Cardiovascular:     Rate and Rhythm: Normal rate and regular rhythm.  Pulmonary:     Effort: Pulmonary effort is normal. No respiratory distress.     Breath sounds: Normal breath sounds. No wheezing.  Abdominal:     General: There is no distension.     Palpations: Abdomen is soft.     Tenderness: There is no abdominal tenderness.     Comments: Well-healed laparoscopic surgical scars.  Musculoskeletal:        General: Normal range of motion.  Skin:    General: Skin is warm  and dry.     Coloration: Skin is not jaundiced.  Neurological:     General: No focal deficit present.     Mental Status: She is alert and oriented to person, place, and time.  Psychiatric:        Mood and Affect: Mood normal.        Behavior: Behavior normal.            Labs, Imaging and Diagnostic Testing: CT abd/pelvis 07/17/22: IMPRESSION: 1. Similar dilation of the common bile duct measuring up to 9 mm at the head of the pancreas extending to the ampulla, dating back to February 23, 2015. However, there is now slightly less tapering of the distal duct through the pancreatic head to the level of the ampulla. Nonspecific and favored within normal limits for this patient given the relative chronic stability. However would suggest correlation with laboratory values to exclude pathologic biliary process/obstruction and if clinically indicated consider evaluation by MRCP with and without contrast. 2. Solid enhancing 12 mm lesion in the gallbladder fundus, primary differential considerations include gallbladder carcinoma or gallbladder polyp. Suggest surgical consult.     Assessment and Plan: Diagnoses and all orders for this visit:   Gallbladder mass -     Hepatic Function Panel (HFP); Future -     CA 19-9 - [facility]       This is a 51 yo female referred for evaluation of a gallbladder polyp dentally noted on imaging workup for kidney stones.  I have personally reviewed referral notes, labs, and imaging.  She has a 1.2 cm polyp in the fundus of the gallbladder on both ultrasound and CT, which is enhancing on CT scan.  There are no liver masses.  I discussed that the majority of gallbladder polyps are benign, however some carry a risk of malignant transformation and cholecystectomy is recommended for polyps larger than 1 cm.  I recommended laparoscopic cholecystectomy.  Based on intraoperative findings if there is any concern for malignancy, a frozen section can be performed and if  malignancy is confirmed, I would recommend an oncologic resection at that same operation which would include an open partial central hepatectomy and portal lymph node dissection.  I discussed all this with the patient and reviewed the benefits and risks of surgery.  She expressed understanding and agrees to proceed with surgery.  She will be contacted to schedule a surgery date.  All questions were answered.  We will check baseline LFTs today as   well as a CA 19-9.  Sophronia Simas, MD [facility] Surgery General, Hepatobiliary and Pancreatic Surgery 07/31/22 11:48 AM

## 2022-08-10 NOTE — Pre-Procedure Instructions (Signed)
Surgical Instructions    Your procedure is scheduled on Monday August 20, 2022.  Report to [facility] Main Entrance "A" at 11:00 A.M., then check in with the Admitting office.  Call this number if you have problems the morning of surgery:  [phone]   If you have any questions prior to your surgery date call [phone]: Open Monday-Friday 8am-4pm If you experience any cold or flu symptoms such as cough, fever, chills, shortness of breath, etc. between now and your scheduled surgery, please notify us at the above number     Remember:  Do not eat after midnight the night before your surgery  You may drink clear liquids until 10:00 am the morning of your surgery.   Clear liquids allowed are: Water, Non-Citrus Juices (without pulp), Carbonated Beverages, Clear Tea, Black Coffee ONLY (NO MILK, CREAM OR POWDERED CREAMER of any kind), and Gatorade    Take these medicines the morning of surgery with A SIP OF WATER:  pantoprazole (PROTONIX)   IF needed: acetaminophen (TYLENOL)  SUMAtriptan (IMITREX)    As of today, STOP taking any Aspirin (unless otherwise instructed by your surgeon) Aleve, Naproxen, Ibuprofen, Motrin, Advil, Goody's, BC's, all herbal medications, fish oil, and all vitamins.  WHAT DO I DO ABOUT MY DIABETES MEDICATION?   Do not take oral diabetes medicines metFORMIN (GLUCOPHAGE) (pills) the morning of surgery.   HOW TO MANAGE YOUR DIABETES BEFORE AND AFTER SURGERY  Why is it important to control my blood sugar before and after surgery? Improving blood sugar levels before and after surgery helps healing and can limit problems. A way of improving blood sugar control is eating a healthy diet by:  Eating less sugar and carbohydrates  Increasing activity/exercise  Talking with your doctor about reaching your blood sugar goals High blood sugars (greater than 180 mg/dL) can raise your risk of infections and slow your recovery, so you will need to focus on controlling  your diabetes during the weeks before surgery. Make sure that the doctor who takes care of your diabetes knows about your planned surgery including the date and location.  How do I manage my blood sugar before surgery? Check your blood sugar at least 4 times a day, starting 2 days before surgery, to make sure that the level is not too high or low.  Check your blood sugar the morning of your surgery when you wake up and every 2 hours until you get to the Short Stay unit.  If your blood sugar is less than 70 mg/dL, you will need to treat for low blood sugar: Do not take insulin. Treat a low blood sugar (less than 70 mg/dL) with  cup of clear juice (cranberry or apple), 4 glucose tablets, OR glucose gel. Recheck blood sugar in 15 minutes after treatment (to make sure it is greater than 70 mg/dL). If your blood sugar is not greater than 70 mg/dL on recheck, call [phone] for further instructions. Report your blood sugar to the short stay nurse when you get to Short Stay.  If you are admitted to the hospital after surgery: Your blood sugar will be checked by the staff and you will probably be given insulin after surgery (instead of oral diabetes medicines) to make sure you have good blood sugar levels. The goal for blood sugar control after surgery is 80-180 mg/dL.            Do not wear jewelry or makeup. Do not wear lotions, powders, perfumes or deodorant. Do not shave  48 hours prior to surgery.   Do not bring valuables to the hospital. Do not wear nail polish, gel polish, artificial nails, or any other type of covering on natural nails (fingers and toes) If you have artificial nails or gel coating that need to be removed by a nail salon, please have this removed prior to surgery. Artificial nails or gel coating may interfere with anesthesia's ability to adequately monitor your vital signs.  [place] is not responsible for any belongings or valuables.    Do NOT Smoke  (Tobacco/Vaping)  24 hours prior to your procedure  If you use a CPAP at night, you may bring your mask for your overnight stay.   Contacts, glasses, hearing aids, dentures or partials may not be worn into surgery, please bring cases for these belongings   For patients admitted to the hospital, discharge time will be determined by your treatment team.   Patients discharged the day of surgery will not be allowed to drive home, and someone needs to stay with them for 24 hours.   SURGICAL WAITING ROOM VISITATION Patients having surgery or a procedure may have no more than 2 support people in the waiting area - these visitors may rotate.   Children under the age of 73 must have an adult with them who is not the patient. If the patient needs to stay at the hospital during part of their recovery, the visitor guidelines for inpatient rooms apply. Pre-op nurse will coordinate an appropriate time for 1 support person to accompany patient in pre-op.  This support person may not rotate.   Please refer to [email or web address] for the visitor guidelines for Inpatients (after your surgery is over and you are in a regular room).    Special instructions:    Oral Hygiene is also important to reduce your risk of infection.  Remember - BRUSH YOUR TEETH THE MORNING OF SURGERY WITH YOUR REGULAR TOOTHPASTE   [place]- Preparing For Surgery  Before surgery, you can play an important role. Because skin is not sterile, your skin needs to be as free of germs as possible. You can reduce the number of germs on your skin by washing with CHG (chlorahexidine gluconate) Soap before surgery.  CHG is an antiseptic cleaner which kills germs and bonds with the skin to continue killing germs even after washing.     Please do not use if you have an allergy to CHG or antibacterial soaps. If your skin becomes reddened/irritated stop using the CHG.  Do not shave (including  legs and underarms) for at least 48 hours prior to first CHG shower. It is OK to shave your face.  Please follow these instructions carefully.     Shower the NIGHT BEFORE SURGERY and the MORNING OF SURGERY with CHG Soap.   If you chose to wash your hair, wash your hair first as usual with your normal shampoo. After you shampoo, rinse your hair and body thoroughly to remove the shampoo.  Then [employer] and genitals (private parts) with your normal soap and rinse thoroughly to remove soap.  After that Use CHG Soap as you would any other liquid soap. You can apply CHG directly to the skin and wash gently with a scrungie or a clean washcloth.   Apply the CHG Soap to your body ONLY FROM THE NECK DOWN.  Do not use on open wounds or open sores. Avoid contact with your eyes, ears, mouth and genitals (private parts). Wash Face and genitals (private parts)  with your normal soap.   Wash thoroughly, paying special attention to the area where your surgery will be performed.  Thoroughly rinse your body with warm water from the neck down.  DO NOT shower/wash with your normal soap after using and rinsing off the CHG Soap.  Pat yourself dry with a CLEAN TOWEL.  Wear CLEAN PAJAMAS to bed the night before surgery  Place CLEAN SHEETS on your bed the night before your surgery  DO NOT SLEEP WITH PETS.   Day of Surgery:  Take a shower with CHG soap. Wear Clean/Comfortable clothing the morning of surgery Do not apply any deodorants/lotions.   Remember to brush your teeth WITH YOUR REGULAR TOOTHPASTE.    If you received a COVID test during your pre-op visit, it is requested that you wear a mask when out in public, stay away from anyone that may not be feeling well, and notify your surgeon if you develop symptoms. If you have been in contact with anyone that has tested positive in the last 10 days, please notify your surgeon.    Please read over the following fact sheets that you were given.

## 2022-08-14 ENCOUNTER — Encounter (HOSPITAL_COMMUNITY): Payer: Self-pay

## 2022-08-14 ENCOUNTER — Encounter (HOSPITAL_COMMUNITY)
Admission: RE | Admit: 2022-08-14 | Discharge: 2022-08-14 | Disposition: A | Discharge status: Home / Self Care | Payer: BC Managed Care – PPO | Source: Ambulatory Visit | Attending: Surgery | Admitting: Surgery

## 2022-08-14 ENCOUNTER — Other Ambulatory Visit: Payer: Self-pay

## 2022-08-14 VITALS — BP 136/92 | HR 116 | Temp 98.0°F | Resp 18 | Ht 68.0 in | Wt 185.3 lb

## 2022-08-14 DIAGNOSIS — Z01818 Encounter for other preprocedural examination: Secondary | ICD-10-CM

## 2022-08-14 DIAGNOSIS — Z01812 Encounter for preprocedural laboratory examination: Secondary | ICD-10-CM | POA: Insufficient documentation

## 2022-08-14 DIAGNOSIS — K824 Cholesterolosis of gallbladder: Secondary | ICD-10-CM | POA: Diagnosis not present

## 2022-08-14 HISTORY — DX: Personal history of urinary calculi: Z87.442

## 2022-08-14 HISTORY — DX: Type 2 diabetes mellitus without complications: E11.9

## 2022-08-14 HISTORY — DX: Headache, unspecified: R51.9

## 2022-08-14 LAB — COMPREHENSIVE METABOLIC PANEL
ALT: 26 U/L (ref 0–44)
AST: 28 U/L (ref 15–41)
Albumin: 4.5 g/dL (ref 3.5–5.0)
Alkaline Phosphatase: 45 U/L (ref 38–126)
Anion gap: 12 (ref 5–15)
BUN: 13 mg/dL (ref 6–20)
CO2: 21 mmol/L — ABNORMAL LOW (ref 22–32)
Calcium: 9.6 mg/dL (ref 8.9–10.3)
Chloride: 102 mmol/L (ref 98–111)
Creatinine, Ser: 0.58 mg/dL (ref 0.44–1.00)
GFR, Estimated: >60 mL/min (ref >60)
Glucose, Bld: 135 mg/dL — ABNORMAL HIGH (ref 70–99)
Potassium: 4.7 mmol/L (ref 3.5–5.1)
Sodium: 135 mmol/L (ref 135–145)
Total Bilirubin: 0.4 mg/dL (ref 0.3–1.2)
Total Protein: 7.4 g/dL (ref 6.5–8.1)

## 2022-08-14 LAB — CBC
HCT: 41.3 % (ref 36.0–46.0)
Hemoglobin: 13.2 g/dL (ref 12.0–15.0)
MCH: 29.1 pg (ref 26.0–34.0)
MCHC: 32 g/dL (ref 30.0–36.0)
MCV: 91.2 fL (ref 80.0–100.0)
Platelets: 275 10*3/uL (ref 150–400)
RBC: 4.53 MIL/uL (ref 3.87–5.11)
RDW: 12.2 % (ref 11.5–15.5)
WBC: 5.4 10*3/uL (ref 4.0–10.5)
nRBC: 0 % (ref 0.0–0.2)

## 2022-08-14 LAB — TYPE AND SCREEN
ABO/RH(D): O POS
Antibody Screen: NEGATIVE

## 2022-08-14 LAB — HEMOGLOBIN A1C
Hgb A1c MFr Bld: 6.3 % — ABNORMAL HIGH (ref 4.8–5.6)
Mean Plasma Glucose: 134 mg/dL

## 2022-08-14 LAB — GLUCOSE, CAPILLARY: Glucose-Capillary: 150 mg/dL — ABNORMAL HIGH (ref 70–99)

## 2022-08-14 NOTE — Progress Notes (Signed)
PCP - Lamar Blinks MD Cardiologist - none  PPM/ICD - denies Device Orders -  Rep Notified -   Chest x-ray - na EKG - 06/13/22 Stress Test - none ECHO - none Cardiac Cath - none  Sleep Study - none CPAP - none  Fasting Blood Sugar - pt does not check her sugar and does not have a meter. Denies ever having symptoms of low blood sugar. Instructed her to drink a half cup of clear juice (apple or cranberry) if she felt shaky,sweaty,dizzy,racing heart.  Recent diagnosis of type 2 diabetes.  Checks Blood Sugar _zero____ times a day  Last dose of GLP1 agonist-   GLP1 instructions:   Blood Thinner Instructions:na Aspirin Instructions:na  ERAS Protcol - clear liquids until 1000 PRE-SURGERY Ensure or G2- no  COVID TEST- no   Anesthesia review: yes because pt has a history of a heart murmur. She does not see a cardiologist and has not had an echocardiogram. She states that she had the heart murmur as a child and that her PCP has never mentioned it.      Patient denies shortness of breath, fever, cough and chest pain at PAT appointment   All instructions explained to the patient, with a verbal understanding of the material. Patient agrees to go over the instructions while at home for a better understanding. Patient also instructed to wear a mask when out in public prior to surgery.  The opportunity to ask questions was provided.

## 2022-08-16 ENCOUNTER — Encounter (HOSPITAL_COMMUNITY): Payer: Self-pay | Admitting: Surgery

## 2022-08-17 NOTE — Progress Notes (Signed)
Left a message for pt to arrive on Mon at 0530.

## 2022-08-19 NOTE — Anesthesia Preprocedure Evaluation (Signed)
Anesthesia Evaluation  Patient identified by MRN, date of birth, ID band Patient awake    Reviewed: Allergy & Precautions, NPO status , Patient's Chart, lab work & pertinent test results  Airway Mallampati: II  TM Distance: >3 FB Neck ROM: Full    Dental no notable dental hx. (+) Dental Advisory Given, Teeth Intact   Pulmonary former smoker   Pulmonary exam normal breath sounds clear to auscultation       Cardiovascular hypertension, Pt. on medications Normal cardiovascular exam+ Valvular Problems/Murmurs  Rhythm:Regular Rate:Normal     Neuro/Psych  Headaches PSYCHIATRIC DISORDERS Anxiety Depression       GI/Hepatic Neg liver ROS,GERD  ,,  Endo/Other  diabetes    Renal/GU negative Renal ROS     Musculoskeletal negative musculoskeletal ROS (+)    Abdominal   Peds  Hematology  (+) Blood dyscrasia, anemia   Anesthesia Other Findings   Reproductive/Obstetrics                             Anesthesia Physical Anesthesia Plan  ASA: 2  Anesthesia Plan: General   Post-op Pain Management: Tylenol PO (pre-op)*   Induction: Intravenous  PONV Risk Score and Plan: 4 or greater and Ondansetron, Dexamethasone, Midazolam and Treatment may vary due to age or medical condition  Airway Management Planned: Oral ETT  Additional Equipment: None  Intra-op Plan:   Post-operative Plan: Extubation in OR  Informed Consent: I have reviewed the patients History and Physical, chart, labs and discussed the procedure including the risks, benefits and alternatives for the proposed anesthesia with the patient or authorized representative who has indicated his/her understanding and acceptance.     Dental advisory given  Plan Discussed with: CRNA  Anesthesia Plan Comments:         Anesthesia Quick Evaluation

## 2022-08-20 ENCOUNTER — Encounter (HOSPITAL_COMMUNITY): Payer: Self-pay | Admitting: Surgery

## 2022-08-20 ENCOUNTER — Ambulatory Visit (HOSPITAL_COMMUNITY): Payer: BC Managed Care – PPO | Admitting: Physician Assistant

## 2022-08-20 ENCOUNTER — Encounter (HOSPITAL_COMMUNITY)
Admission: RE | Disposition: A | Discharge status: Home / Self Care | Payer: Self-pay | Source: Home / Self Care | Attending: Surgery

## 2022-08-20 ENCOUNTER — Ambulatory Visit (HOSPITAL_COMMUNITY)
Admission: RE | Admit: 2022-08-20 | Discharge: 2022-08-20 | Disposition: A | Discharge status: Home / Self Care | Payer: BC Managed Care – PPO | Attending: Surgery | Admitting: Surgery

## 2022-08-20 ENCOUNTER — Other Ambulatory Visit: Payer: Self-pay

## 2022-08-20 DIAGNOSIS — K219 Gastro-esophageal reflux disease without esophagitis: Secondary | ICD-10-CM | POA: Diagnosis not present

## 2022-08-20 DIAGNOSIS — Z01818 Encounter for other preprocedural examination: Secondary | ICD-10-CM

## 2022-08-20 DIAGNOSIS — I10 Essential (primary) hypertension: Secondary | ICD-10-CM | POA: Insufficient documentation

## 2022-08-20 DIAGNOSIS — E119 Type 2 diabetes mellitus without complications: Secondary | ICD-10-CM | POA: Insufficient documentation

## 2022-08-20 DIAGNOSIS — Z87891 Personal history of nicotine dependence: Secondary | ICD-10-CM | POA: Insufficient documentation

## 2022-08-20 DIAGNOSIS — Z7984 Long term (current) use of oral hypoglycemic drugs: Secondary | ICD-10-CM | POA: Diagnosis not present

## 2022-08-20 DIAGNOSIS — K828 Other specified diseases of gallbladder: Secondary | ICD-10-CM | POA: Diagnosis not present

## 2022-08-20 DIAGNOSIS — K824 Cholesterolosis of gallbladder: Secondary | ICD-10-CM | POA: Diagnosis not present

## 2022-08-20 DIAGNOSIS — K811 Chronic cholecystitis: Secondary | ICD-10-CM | POA: Insufficient documentation

## 2022-08-20 HISTORY — PX: CHOLECYSTECTOMY: SHX55

## 2022-08-20 LAB — GLUCOSE, CAPILLARY
Glucose-Capillary: 136 mg/dL — ABNORMAL HIGH (ref 70–99)
Glucose-Capillary: 159 mg/dL — ABNORMAL HIGH (ref 70–99)

## 2022-08-20 LAB — POCT PREGNANCY, URINE: Preg Test, Ur: NEGATIVE

## 2022-08-20 SURGERY — LAPAROSCOPIC CHOLECYSTECTOMY
Anesthesia: General

## 2022-08-20 MED ORDER — ACETAMINOPHEN 500 MG PO TABS: 500.0000 mg | ORAL_TABLET | Freq: Four times a day (QID) | ORAL | 0 refills | Status: AC | PRN

## 2022-08-20 MED ORDER — HYDROCODONE-ACETAMINOPHEN 5-325 MG PO TABS: 1.0000 | ORAL_TABLET | Freq: Four times a day (QID) | ORAL | 0 refills | Status: AC | PRN

## 2022-08-20 MED ORDER — DEXAMETHASONE SODIUM PHOSPHATE 10 MG/ML IJ SOLN
INTRAMUSCULAR | Status: AC
  Filled 2022-08-20: qty 1

## 2022-08-20 MED ORDER — DEXAMETHASONE SODIUM PHOSPHATE 10 MG/ML IJ SOLN
INTRAMUSCULAR | Status: DC | PRN
  Administered 2022-08-20: 10 mg via INTRAVENOUS

## 2022-08-20 MED ORDER — BUPIVACAINE-EPINEPHRINE (PF) 0.5% -1:200000 IJ SOLN
INTRAMUSCULAR | Status: AC
  Filled 2022-08-20: qty 30

## 2022-08-20 MED ORDER — DEXMEDETOMIDINE HCL IN NACL 80 MCG/20ML IV SOLN
INTRAVENOUS | Status: DC | PRN
  Administered 2022-08-20: 12 ug via INTRAVENOUS

## 2022-08-20 MED ORDER — MEPERIDINE HCL 25 MG/ML IJ SOLN: 6.2500 mg | INTRAMUSCULAR | Status: DC | PRN

## 2022-08-20 MED ORDER — ACETAMINOPHEN 500 MG PO TABS
ORAL_TABLET | ORAL | Status: AC
  Administered 2022-08-20: 1000 mg via ORAL
  Filled 2022-08-20: qty 2

## 2022-08-20 MED ORDER — ONDANSETRON HCL 4 MG/2ML IJ SOLN
INTRAMUSCULAR | Status: AC
  Filled 2022-08-20: qty 2

## 2022-08-20 MED ORDER — PROPOFOL 10 MG/ML IV BOLUS
INTRAVENOUS | Status: AC
  Filled 2022-08-20: qty 20

## 2022-08-20 MED ORDER — SUGAMMADEX SODIUM 200 MG/2ML IV SOLN
INTRAVENOUS | Status: DC | PRN
  Administered 2022-08-20: 200 mg via INTRAVENOUS

## 2022-08-20 MED ORDER — OXYCODONE HCL 5 MG PO TABS
5.0000 mg | ORAL_TABLET | Freq: Once | ORAL | Status: AC | PRN
  Administered 2022-08-20: 5 mg via ORAL

## 2022-08-20 MED ORDER — OXYCODONE HCL 5 MG/5ML PO SOLN: 5.0000 mg | Freq: Once | ORAL | Status: AC | PRN

## 2022-08-20 MED ORDER — FENTANYL CITRATE (PF) 250 MCG/5ML IJ SOLN
INTRAMUSCULAR | Status: DC | PRN
  Administered 2022-08-20: 50 ug via INTRAVENOUS
  Administered 2022-08-20 (×2): 100 ug via INTRAVENOUS

## 2022-08-20 MED ORDER — MIDAZOLAM HCL 2 MG/2ML IJ SOLN
INTRAMUSCULAR | Status: DC | PRN
  Administered 2022-08-20: 2 mg via INTRAVENOUS

## 2022-08-20 MED ORDER — WATER FOR IRRIGATION, STERILE IR SOLN
Status: DC | PRN
  Administered 2022-08-20: 1000 mL

## 2022-08-20 MED ORDER — HYDROMORPHONE HCL 1 MG/ML IJ SOLN
INTRAMUSCULAR | Status: AC
  Filled 2022-08-20: qty 0.5

## 2022-08-20 MED ORDER — PROMETHAZINE HCL 25 MG/ML IJ SOLN: 6.2500 mg | INTRAMUSCULAR | Status: DC | PRN

## 2022-08-20 MED ORDER — 0.9 % SODIUM CHLORIDE (POUR BTL) OPTIME
TOPICAL | Status: DC | PRN
  Administered 2022-08-20: 1000 mL

## 2022-08-20 MED ORDER — CHLORHEXIDINE GLUCONATE 0.12 % MT SOLN
OROMUCOSAL | Status: AC
  Administered 2022-08-20: 15 mL via OROMUCOSAL
  Filled 2022-08-20: qty 15

## 2022-08-20 MED ORDER — HYDROMORPHONE HCL 1 MG/ML IJ SOLN
0.2500 mg | INTRAMUSCULAR | Status: DC | PRN
  Administered 2022-08-20 (×2): 0.5 mg via INTRAVENOUS

## 2022-08-20 MED ORDER — HYDROMORPHONE HCL 1 MG/ML IJ SOLN
INTRAMUSCULAR | Status: DC | PRN
  Administered 2022-08-20 (×2): .5 mg via INTRAVENOUS

## 2022-08-20 MED ORDER — LIDOCAINE 2% (20 MG/ML) 5 ML SYRINGE
INTRAMUSCULAR | Status: AC
  Filled 2022-08-20: qty 5

## 2022-08-20 MED ORDER — OXYCODONE HCL 5 MG PO TABS
ORAL_TABLET | ORAL | Status: AC
  Filled 2022-08-20: qty 1

## 2022-08-20 MED ORDER — MIDAZOLAM HCL 2 MG/2ML IJ SOLN
INTRAMUSCULAR | Status: AC
  Filled 2022-08-20: qty 2

## 2022-08-20 MED ORDER — PROPOFOL 10 MG/ML IV BOLUS
INTRAVENOUS | Status: DC | PRN
  Administered 2022-08-20: 120 mg via INTRAVENOUS
  Administered 2022-08-20: 30 mg via INTRAVENOUS

## 2022-08-20 MED ORDER — BUPIVACAINE-EPINEPHRINE (PF) 0.5% -1:200000 IJ SOLN
INTRAMUSCULAR | Status: DC | PRN
  Administered 2022-08-20: 30 mL

## 2022-08-20 MED ORDER — CHLORHEXIDINE GLUCONATE 0.12 % MT SOLN: 15.0000 mL | Freq: Once | OROMUCOSAL | Status: AC

## 2022-08-20 MED ORDER — ROCURONIUM BROMIDE 10 MG/ML (PF) SYRINGE
PREFILLED_SYRINGE | INTRAVENOUS | Status: DC | PRN
  Administered 2022-08-20 (×2): 50 mg via INTRAVENOUS

## 2022-08-20 MED ORDER — ONDANSETRON HCL 4 MG/2ML IJ SOLN
INTRAMUSCULAR | Status: DC | PRN
  Administered 2022-08-20: 4 mg via INTRAVENOUS

## 2022-08-20 MED ORDER — ACETAMINOPHEN 500 MG PO TABS: 1000.0000 mg | ORAL_TABLET | ORAL | Status: AC

## 2022-08-20 MED ORDER — FENTANYL CITRATE (PF) 250 MCG/5ML IJ SOLN
INTRAMUSCULAR | Status: AC
  Filled 2022-08-20: qty 5

## 2022-08-20 MED ORDER — CEFAZOLIN SODIUM-DEXTROSE 2-4 GM/100ML-% IV SOLN
INTRAVENOUS | Status: AC
  Filled 2022-08-20: qty 100

## 2022-08-20 MED ORDER — LIDOCAINE 2% (20 MG/ML) 5 ML SYRINGE
INTRAMUSCULAR | Status: DC | PRN
  Administered 2022-08-20: 80 mg via INTRAVENOUS

## 2022-08-20 MED ORDER — SODIUM CHLORIDE 0.9 % IR SOLN
Status: DC | PRN
  Administered 2022-08-20: 1000 mL

## 2022-08-20 MED ORDER — VASOPRESSIN 20 UNIT/ML IV SOLN
INTRAVENOUS | Status: AC
  Filled 2022-08-20: qty 1

## 2022-08-20 MED ORDER — LACTATED RINGERS IV SOLN: INTRAVENOUS | Status: DC

## 2022-08-20 MED ORDER — ORAL CARE MOUTH RINSE: 15.0000 mL | Freq: Once | OROMUCOSAL | Status: AC

## 2022-08-20 MED ORDER — CEFAZOLIN SODIUM-DEXTROSE 2-4 GM/100ML-% IV SOLN
2.0000 g | INTRAVENOUS | Status: AC
  Administered 2022-08-20: 2 g via INTRAVENOUS

## 2022-08-20 MED ORDER — ROCURONIUM BROMIDE 10 MG/ML (PF) SYRINGE
PREFILLED_SYRINGE | INTRAVENOUS | Status: AC
  Filled 2022-08-20: qty 10

## 2022-08-20 MED ORDER — HYDROMORPHONE HCL 1 MG/ML IJ SOLN
INTRAMUSCULAR | Status: AC
  Filled 2022-08-20: qty 1

## 2022-08-20 MED ORDER — AMISULPRIDE (ANTIEMETIC) 5 MG/2ML IV SOLN: 10.0000 mg | Freq: Once | INTRAVENOUS | Status: DC | PRN

## 2022-08-20 MED ORDER — INSULIN ASPART 100 UNIT/ML IJ SOLN: 0.0000 [IU] | INTRAMUSCULAR | Status: DC | PRN

## 2022-08-20 SURGICAL SUPPLY — 94 items
ADH SKN CLS APL DERMABOND .7 (GAUZE/BANDAGES/DRESSINGS) ×1
AGENT HMST 10 BLLW SHRT CANN (HEMOSTASIS)
APL PRP STRL LF DISP 70% ISPRP (MISCELLANEOUS) ×1
APPLIER CLIP 5 13 M/L LIGAMAX5 (MISCELLANEOUS) ×1
APR CLP MED LRG 5 ANG JAW (MISCELLANEOUS) ×1
BAG COUNTER SPONGE SURGICOUNT (BAG) ×2 IMPLANT
BAG SPNG CNTER NS LX DISP (BAG) ×1
BIOPATCH RED 1 DISK 7.0 (GAUZE/BANDAGES/DRESSINGS) ×4 IMPLANT
BLADE CLIPPER SURG (BLADE) IMPLANT
BOOT SUTURE AID YELLOW STND (SUTURE) IMPLANT
CANISTER SUCT 3000ML PPV (MISCELLANEOUS) ×2 IMPLANT
CHLORAPREP W/TINT 26 (MISCELLANEOUS) ×2 IMPLANT
CLIP APPLIE 5 13 M/L LIGAMAX5 (MISCELLANEOUS) ×2 IMPLANT
CLIP TI WIDE RED SMALL 24 (CLIP) ×2 IMPLANT
CLIP VESOCCLUDE MED 24/CT (CLIP) ×2 IMPLANT
CNTNR URN SCR LID CUP LEK RST (MISCELLANEOUS) IMPLANT
CONT SPEC 4OZ STRL OR WHT (MISCELLANEOUS)
COVER SURGICAL LIGHT HANDLE (MISCELLANEOUS) ×2 IMPLANT
DERMABOND ADVANCED .7 DNX12 (GAUZE/BANDAGES/DRESSINGS) ×4 IMPLANT
DRAIN CHANNEL 19F RND (DRAIN) ×2 IMPLANT
DRAPE INCISE IOBAN 66X45 STRL (DRAPES) ×2 IMPLANT
DRAPE LAPAROSCOPIC ABDOMINAL (DRAPES) ×2 IMPLANT
DRAPE WARM FLUID 44X44 (DRAPES) ×2 IMPLANT
DRSG TEGADERM 4X4.75 (GAUZE/BANDAGES/DRESSINGS) ×4 IMPLANT
DRSG TELFA 3X8 NADH STRL (GAUZE/BANDAGES/DRESSINGS) IMPLANT
ELECT BLADE 6.5 EXT (BLADE) ×2 IMPLANT
ELECT CAUTERY BLADE 6.4 (BLADE) ×2 IMPLANT
ELECT PAD DSPR THERM+ ADLT (MISCELLANEOUS) ×2 IMPLANT
ELECT REM PT RETURN 9FT ADLT (ELECTROSURGICAL) ×1
ELECTRODE REM PT RTRN 9FT ADLT (ELECTROSURGICAL) ×2 IMPLANT
EVACUATOR SILICONE 100CC (DRAIN) ×2 IMPLANT
GAUZE 4X4 16PLY ~~LOC~~+RFID DBL (SPONGE) IMPLANT
GLOVE BIOGEL PI IND STRL 6 (GLOVE) ×2 IMPLANT
GLOVE BIOGEL PI MICRO STRL 5.5 (GLOVE) ×4 IMPLANT
GOWN STRL REUS W/ TWL LRG LVL3 (GOWN DISPOSABLE) ×6 IMPLANT
GOWN STRL REUS W/TWL LRG LVL3 (GOWN DISPOSABLE) ×3
HAND PENCIL TRP OPTION (MISCELLANEOUS) ×2 IMPLANT
HANDLE SUCTION POOLE (INSTRUMENTS) ×2 IMPLANT
HEMOSTAT HEMOBLAST BELLOWS (HEMOSTASIS) IMPLANT
HEMOSTAT SNOW SURGICEL 2X4 (HEMOSTASIS) ×2 IMPLANT
KIT BASIN OR (CUSTOM PROCEDURE TRAY) ×2 IMPLANT
KIT TURNOVER KIT B (KITS) ×2 IMPLANT
L-HOOK LAP DISP 36CM (ELECTROSURGICAL) ×1
LHOOK LAP DISP 36CM (ELECTROSURGICAL) ×2 IMPLANT
LIGASURE IMPACT 36 18CM CVD LR (INSTRUMENTS) IMPLANT
NDL INSUFFLATION 14GA 120MM (NEEDLE) IMPLANT
NEEDLE HYPO 22GX1.5 SAFETY (NEEDLE) IMPLANT
NEEDLE INSUFFLATION 14GA 120MM (NEEDLE) IMPLANT
NS IRRIG 1000ML POUR BTL (IV SOLUTION) ×4 IMPLANT
PACK GENERAL/GYN (CUSTOM PROCEDURE TRAY) ×2 IMPLANT
PAD ARMBOARD 7.5X6 YLW CONV (MISCELLANEOUS) ×2 IMPLANT
PENCIL BUTTON HOLSTER BLD 10FT (ELECTRODE) ×2 IMPLANT
PENCIL SMOKE EVACUATOR (MISCELLANEOUS) ×2 IMPLANT
RETRACTOR WOUND ALXS 34CM XLRG (MISCELLANEOUS) IMPLANT
RTRCTR WOUND ALEXIS 34CM XLRG (MISCELLANEOUS)
SCISSORS LAP 5X35 DISP (ENDOMECHANICALS) ×2 IMPLANT
SEALER BIPOLAR AQUA 6.0 (INSTRUMENTS) ×2 IMPLANT
SET IRRIG TUBING LAPAROSCOPIC (IRRIGATION / IRRIGATOR) ×2 IMPLANT
SET TUBE SMOKE EVAC HIGH FLOW (TUBING) ×2 IMPLANT
SLEEVE ENDOPATH XCEL 5M (ENDOMECHANICALS) ×4 IMPLANT
SPONGE T-LAP 18X18 ~~LOC~~+RFID (SPONGE) ×2 IMPLANT
SUCTION POOLE HANDLE (INSTRUMENTS) ×1
SUT CHROMIC 0 BP (SUTURE) ×4 IMPLANT
SUT CHROMIC 3 0 CT 36 (SUTURE) IMPLANT
SUT ETHILON 2 0 FS 18 (SUTURE) ×2 IMPLANT
SUT MNCRL AB 4-0 PS2 18 (SUTURE) ×2 IMPLANT
SUT PDS AB 1 TP1 96 (SUTURE) ×4 IMPLANT
SUT PDS AB 4-0 RB1 27 (SUTURE) IMPLANT
SUT PROLENE 2 0 SH DA (SUTURE) IMPLANT
SUT PROLENE 3 0 SH 48 (SUTURE) ×2 IMPLANT
SUT PROLENE 4 0 RB 1 (SUTURE)
SUT PROLENE 4-0 RB1 .5 CRCL 36 (SUTURE) ×4 IMPLANT
SUT SILK 2 0 SH (SUTURE) IMPLANT
SUT SILK 2 0 TIES 10X30 (SUTURE) ×2 IMPLANT
SUT SILK 2 0SH CR/8 30 (SUTURE) IMPLANT
SUT SILK 3 0 TIES 10X30 (SUTURE) IMPLANT
SUT SILK 3 0SH CR/8 30 (SUTURE) IMPLANT
SUT VIC AB 3-0 SH 27 (SUTURE)
SUT VIC AB 3-0 SH 27X BRD (SUTURE) ×2 IMPLANT
SUT VICRYL 0 UR6 27IN ABS (SUTURE) IMPLANT
SYR BULB IRRIG 60ML STRL (SYRINGE) ×2 IMPLANT
SYR CONTROL 10ML LL (SYRINGE) IMPLANT
SYS BAG RETRIEVAL 10MM (BASKET) ×1
SYSTEM BAG RETRIEVAL 10MM (BASKET) IMPLANT
TOWEL GREEN STERILE (TOWEL DISPOSABLE) ×2 IMPLANT
TOWEL GREEN STERILE FF (TOWEL DISPOSABLE) ×2 IMPLANT
TRAY FOLEY MTR SLVR 14FR STAT (SET/KITS/TRAYS/PACK) ×2 IMPLANT
TRAY LAPAROSCOPIC MC (CUSTOM PROCEDURE TRAY) ×2 IMPLANT
TROCAR XCEL 12X100 BLDLESS (ENDOMECHANICALS) IMPLANT
TROCAR XCEL BLUNT TIP 100MML (ENDOMECHANICALS) ×2 IMPLANT
TROCAR Z-THREAD OPTICAL 5X100M (TROCAR) ×2 IMPLANT
TUBE CONNECTING 12X1/4 (SUCTIONS) IMPLANT
WARMER LAPAROSCOPE (MISCELLANEOUS) ×2 IMPLANT
WATER STERILE IRR 1000ML POUR (IV SOLUTION) ×2 IMPLANT

## 2022-08-20 NOTE — Op Note (Signed)
Date: 08/20/22  Patient: Lisa [name] MRN: [id number]  Preoperative Diagnosis: Gallbladder polyp Postoperative Diagnosis: Same  Procedure: Laparoscopic cholecystectomy  Surgeon: Michaelle Birks, MD  EBL: Minimal  Anesthesia: General endotracheal  Specimens:  Gallbladder polyp (frozen section) Cystic duct margin Gallbladder Calot lymph node  Indications: Lisa [name] is a 52 yo female who underwent a CT scan for workup of kidney stones, and was incidentally noted to have a polyp in the fundus of the gallbladder. This was confirmed on RUQ [place], and measured 80mm. After a discussion of the risks and benefits of surgery, she agreed to proceed with cholecystectomy, as well as possible partial central hepatectomy if malignancy was identified intraoperatively.  Findings: Polyp in the fundus of the gallbladder, consistent with a benign cholesterol polyp on frozen section.  Procedure details: Informed consent was obtained in the preoperative area prior to the procedure. The patient was brought to the operating room and placed on the table in the supine position. General anesthesia was induced and appropriate lines and drains were placed for intraoperative monitoring. Perioperative antibiotics were administered per SCIP guidelines. The abdomen was prepped and draped in the usual sterile fashion. A pre-procedure timeout was taken verifying patient identity, surgical site and procedure to be performed.  A small infraumbilical skin incision was made, the subcutaneous tissue was divided with cautery, and the umbilical stalk was grasped and elevated. The fascia was incised and the peritoneal cavity was directly visualized. A 60mm Hassan trocar was placed and the abdomen was insufflated. The peritoneal cavity was inspected with no evidence of visceral or vascular injury. Three 55mm ports were placed in the right subcostal margin, all under direct visualization. The fundus of the gallbladder was grasped and  retracted cephalad. The infundibulum was retracted laterally. The cystic triangle was dissected out using cautery and blunt dissection, and the critical view of safety was obtained. The cystic duct and cystic artery were clipped and ligated. Calot's node was bluntly dissected off the cystic artery, excised and sent for routine pathology. The gallbladder was taken off the liver using cautery, taking care not to violate the wall of the gallbladder. There was no spillage of bile during this dissection. The specimen was completely taken off the liver, placed in an endocatch bag and removed via the umbilical port site. The gallbladder was examined on the back table.  The cystic duct margin was sharply excised and sent as a separate specimen to pathology. The gallbladder was opened and a polyp identified in the fundus, approximately 1cm in diameter. A small sample of the polyp was sharply excised and sent for frozen section, which was negative for malignant cells and consistent with a benign cholesterol polyp. The remainder of the gallbladder was sent for routine pathology. The surgical site was irrigated with saline until the effluent was clear. Hemostasis was achieved in the gallbladder fossa using cautery. The cystic duct and artery stumps were visually inspected and there was no evidence of bile leak or bleeding. The ports were removed under direct visualization and the abdomen was desufflated.  The umbilical port site fascia was closed with a 0 vicryl suture. The skin at all port sites was closed with 4-0 monocryl subcuticular suture. Dermabond was applied.  The patient tolerated the procedure well with no apparent complications. All counts were correct x2 at the end of the procedure. The patient was extubated and taken to PACU in stable condition.  Michaelle Birks, MD 08/20/22 9:33 AM

## 2022-08-20 NOTE — Anesthesia Procedure Notes (Addendum)
Procedure Name: Intubation Date/Time: 08/20/2022 7:43 AM  Performed by: Lance Coon, CRNAPre-anesthesia Checklist: Patient identified, Emergency Drugs available, Suction available, Patient being monitored and Timeout performed Patient Re-evaluated:Patient Re-evaluated prior to induction Oxygen Delivery Method: Circle system utilized Preoxygenation: Pre-oxygenation with 100% oxygen Induction Type: IV induction Ventilation: Mask ventilation without difficulty Laryngoscope Size: Miller and 3 Grade View: Grade I Tube type: Oral Tube size: 7.0 mm Number of attempts: 1 Airway Equipment and Method: Stylet Placement Confirmation: ETT inserted through vocal cords under direct vision, positive ETCO2 and breath sounds checked- equal and bilateral Secured at: 21 cm Tube secured with: Tape Dental Injury: Teeth and Oropharynx as per pre-operative assessment

## 2022-08-20 NOTE — Transfer of Care (Signed)
Immediate Anesthesia Transfer of Care Note  Patient: Lisa [name]  Procedure(s) Performed: LAPAROSCOPIC CHOLECYSTECTOMY  Patient Location: PACU  Anesthesia Type:General  Level of Consciousness: drowsy and patient cooperative  Airway & Oxygen Therapy: Patient Spontanous Breathing  Post-op Assessment: Report given to RN and Post -op Vital signs reviewed and stable  Post vital signs: Reviewed and stable  Last Vitals:  Vitals Value Taken Time  BP 133/91 08/20/22 0900  Temp    Pulse 98 08/20/22 0900  Resp 15 08/20/22 0900  SpO2 95 % 08/20/22 0900  Vitals shown include unvalidated device data.  Last Pain:  Vitals:   08/20/22 0615  TempSrc:   PainSc: 0-No pain         Complications: No notable events documented.

## 2022-08-20 NOTE — Interval H&P Note (Signed)
History and Physical Interval Note:  08/20/2022 7:19 AM  Lisa [name]  has presented today for surgery, with the diagnosis of GALLBLADDER POLYP.  The various methods of treatment have been discussed with the patient and family. After consideration of risks, benefits and other options for treatment, the patient has consented to laparoscopic cholecystectomy, possible open partial central hepatectomy as a surgical intervention.  The patient's history has been reviewed, patient examined, no change in status, stable for surgery.  I have reviewed the patient's chart and labs.  Questions were answered to the patient's satisfaction.     Dwan Bolt

## 2022-08-20 NOTE — Anesthesia Postprocedure Evaluation (Signed)
Anesthesia Post Note  Patient: Lisa [name]  Procedure(s) Performed: LAPAROSCOPIC CHOLECYSTECTOMY     Patient location during evaluation: PACU Anesthesia Type: General Level of consciousness: sedated and patient cooperative Pain management: pain level controlled Vital Signs Assessment: post-procedure vital signs reviewed and stable Respiratory status: spontaneous breathing Cardiovascular status: stable Anesthetic complications: no   No notable events documented.  Last Vitals:  Vitals:   08/20/22 0915 08/20/22 0930  BP: 129/87 (!) 133/95  Pulse: 97 99  Resp: 14 15  Temp:    SpO2: 93% 93%    Last Pain:  Vitals:   08/20/22 0900  TempSrc:   PainSc: [name]

## 2022-08-20 NOTE — Discharge Instructions (Addendum)
[facility] DISCHARGE INSTRUCTIONS  Activity No heavy lifting greater than 15 pounds for 4 weeks after surgery. Ok to shower in 24 hours, but do not bathe or submerge incisions underwater. Do not drive while taking narcotic pain medication.  Wound Care Your incisions are covered with skin glue called Dermabond. This will peel off on its own over time. You may shower and allow warm soapy water to run over your incisions. Gently pat dry. Do not submerge your incision underwater. Monitor your incision for any new redness, tenderness, or drainage.  When to Call us: Fever greater than 100.5 New redness, drainage, or swelling at incision site Severe pain, nausea, or vomiting Jaundice (yellowing of the whites of the eyes or skin)  Follow-up You have an appointment scheduled with Dr. Zenia Resides on September 11, 2022 at 9:30am. This will be at the [facility] Surgery office at [address] [place], [place], [place]. Please arrive at least 15 minutes prior to your scheduled appointment time.  For questions or concerns, please call the office at [phone].

## 2022-08-21 ENCOUNTER — Encounter (HOSPITAL_COMMUNITY): Payer: Self-pay | Admitting: Surgery

## 2022-08-21 DIAGNOSIS — Z9289 Personal history of other medical treatment: Secondary | ICD-10-CM

## 2022-08-21 HISTORY — DX: Personal history of other medical treatment: Z92.89

## 2022-08-21 LAB — SURGICAL PATHOLOGY

## 2022-09-15 ENCOUNTER — Other Ambulatory Visit: Payer: Self-pay | Admitting: Family Medicine

## 2022-09-15 DIAGNOSIS — E7849 Other hyperlipidemia: Secondary | ICD-10-CM

## 2022-09-25 ENCOUNTER — Ambulatory Visit: Payer: BC Managed Care – PPO | Admitting: *Deleted

## 2022-09-25 DIAGNOSIS — Z3042 Encounter for surveillance of injectable contraceptive: Secondary | ICD-10-CM | POA: Diagnosis not present

## 2022-09-25 MED ORDER — MEDROXYPROGESTERONE ACETATE 150 MG/ML IM SUSP
150.0000 mg | Freq: Once | INTRAMUSCULAR | Status: AC
  Administered 2022-09-25: 150 mg via INTRAMUSCULAR

## 2022-09-25 NOTE — Progress Notes (Signed)
Pt here for Depo provera injection per Dr. Lorelei Pont.  Pt is in her window.  Injection given in  and pt tolerated injection well.  Patient stated that she will call back to scheduled her next   Pt due 5/1-5/15.

## 2022-10-25 DIAGNOSIS — H2513 Age-related nuclear cataract, bilateral: Secondary | ICD-10-CM | POA: Diagnosis not present

## 2022-10-25 DIAGNOSIS — H401132 Primary open-angle glaucoma, bilateral, moderate stage: Secondary | ICD-10-CM | POA: Diagnosis not present

## 2022-10-25 DIAGNOSIS — H53413 Scotoma involving central area, bilateral: Secondary | ICD-10-CM | POA: Diagnosis not present

## 2022-10-25 DIAGNOSIS — I10 Essential (primary) hypertension: Secondary | ICD-10-CM | POA: Diagnosis not present

## 2022-12-19 ENCOUNTER — Ambulatory Visit (INDEPENDENT_AMBULATORY_CARE_PROVIDER_SITE_OTHER): Payer: BC Managed Care – PPO

## 2022-12-19 DIAGNOSIS — Z3042 Encounter for surveillance of injectable contraceptive: Secondary | ICD-10-CM | POA: Diagnosis not present

## 2022-12-19 MED ORDER — MEDROXYPROGESTERONE ACETATE 150 MG/ML IM SUSP
150.0000 mg | INTRAMUSCULAR | Status: DC
  Administered 2022-12-19: 150 mg via INTRAMUSCULAR

## 2022-12-19 NOTE — Progress Notes (Signed)
Pt here for Depo provera injection per Dr. Patsy Lager.   Pt is in her window.   Injection given in  and pt tolerated injection well.   Patient stated that she will call back to be scheduled.  Next dates are 07/24 through 03/20/2023

## 2023-01-17 ENCOUNTER — Ambulatory Visit (INDEPENDENT_AMBULATORY_CARE_PROVIDER_SITE_OTHER): Payer: Worker's Compensation | Admitting: Orthopedic Surgery

## 2023-01-17 ENCOUNTER — Other Ambulatory Visit (INDEPENDENT_AMBULATORY_CARE_PROVIDER_SITE_OTHER): Payer: Worker's Compensation

## 2023-01-17 ENCOUNTER — Encounter: Payer: Self-pay | Admitting: Orthopedic Surgery

## 2023-01-17 DIAGNOSIS — M25571 Pain in right ankle and joints of right foot: Secondary | ICD-10-CM | POA: Diagnosis not present

## 2023-01-17 DIAGNOSIS — M25572 Pain in left ankle and joints of left foot: Secondary | ICD-10-CM

## 2023-01-17 MED ORDER — TRAMADOL HCL 50 MG PO TABS: 50.0000 mg | ORAL_TABLET | Freq: Four times a day (QID) | ORAL | 0 refills | Status: DC | PRN

## 2023-01-17 NOTE — Progress Notes (Signed)
Office Visit Note   Patient: Lisa [name]           Date of Birth: [birth year]           MRN: [id number] Visit Date: 01/17/2023 Requested by: Pearline Cables, MD [address] [place],  [place] [address] PCP: Pearline Cables, MD  Subjective: Chief Complaint  Patient presents with   Other    Bilateral ankle injuries DOI:01/11/23    HPI: Lisa [name] is a 52 y.o. female who presents to the office reporting right ankle pain and left ankle pain.  Date of injury 01/11/2023.  She stepped off a curb at work during a Loss adjuster, chartered.  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  She reported bilateral ankle pain interestingly left more than right.  Did have a lot of bruising and swelling.  Played soccer and describes history of mild instability in the left ankle but she "really rolled it" when she stepped off the curb.  Also sustained right ankle injury at that time.  Subsequent radiographs demonstrate lateral malleolus fracture.  She has had hip replacement and ACL reconstruction on the left.  No personal or family history of DVT or pulmonary embolism..                ROS: All systems reviewed are negative as they relate to the chief complaint within the history of present illness.  Patient denies fevers or chills.  Assessment & Plan: Visit Diagnoses:  1. Bilateral ankle pain, unspecified chronicity     Plan: Impression is right ankle lateral malleolar fracture with mortise instability on stress fluoroscopy.  The syndesmosis itself does look stable but there is about 3 to 4 mm of side-to-side motion within the mortise.  Her best bet for stabilization would be fixation of that lateral malleolus fracture.  The risk and benefits are discussed include not limited to infection or vessel damage incomplete healing as well as ankle stiffness.  Patient is going on a cruise on 02/20/2023.  I think she will be moderately functional by that time.  There is a remote possibility  that syndesmotic fixation will be required but that is really a game time decision based on how the ankle feels after plate fixation.  All questions answered.  Follow-Up Instructions: No follow-ups on file.   Orders:  Orders Placed This Encounter  Procedures   XR Ankle Complete Right   XR Ankle Complete Left   Meds ordered this encounter  Medications   traMADol (ULTRAM) 50 MG tablet    Sig: Take 1 tablet (50 mg total) by mouth every 6 (six) hours as needed.    Dispense:  30 tablet    Refill:  0      Procedures: No procedures performed   Clinical Data: No additional findings.  Objective: Vital Signs: There were no vitals taken for this visit.  Physical Exam:  Constitutional: Patient appears well-developed HEENT:  Head: Normocephalic Eyes:EOM are normal Neck: Normal range of motion Cardiovascular: Normal rate Pulmonary/chest: Effort normal Neurologic: Patient is alert Skin: Skin is warm Psychiatric: Patient has normal mood and affect  Ortho Exam: Ortho exam demonstrates on that left-hand side palpable intact nontender anterior to posterior tib peroneal and Achilles tendon.  She does have some increased anterior drawer and varus tilt testing on that lateral ankle on the left.  No fibular tenderness.  On the right-hand side there is bruising as there is on the left.  Patient does have tenderness over the lateral malleolus but no medial sided tenderness.  There is increased laxity with medial to lateral stress of the heel.  Compartments are soft bilaterally.  Pedal pulses are intact bilaterally with intact sensation on the dorsal and plantar aspect of the foot.  Specialty Comments:  No specialty comments available.  Imaging: XR Ankle Complete Right  Result Date: 01/17/2023 AP lateral mortise radiographs right ankle reviewed.  Oblique fibular fracture at the level of the syndesmosis extending proximally is present.  No medial malleolar or posterior malleolar fragment is  present.  Mortise intact.  XR Ankle Complete Left  Result Date: 01/17/2023 AP lateral mortise radiographs left ankle reviewed.  No acute fracture.  Mortise is symmetric.  No significant degenerative changes in the tibiotalar joint.  Anterior process calcaneus and lateral process talus intact    PMFS History: Patient Active Problem List   Diagnosis Date Noted   Elevated ferritin 08/27/2019   Pain in right toe(s) 04/07/2019   Fecal incontinence 01/15/2018   Pre-diabetes 02/14/2017   Left anterior cruciate ligament tear 08/03/2016   Acute medial meniscus tear of left knee 08/03/2016   Acute lateral meniscus tear of left knee 08/03/2016   History of abnormal cervical Pap smear 09/21/2013   Leukocytopenia 03/02/2013   S/P hip replacement 02/06/2013   Hyperlipidemia 10/13/2012   HTN (hypertension) 10/13/2012   Past Medical History:  Diagnosis Date   Anemia    hx of   Anxiety 20 years ago   Depression 20 years ago   Diabetes mellitus without complication (HCC)    GERD (gastroesophageal reflux disease)    Headache    Heart murmur    in childhood   History of kidney stones    Hyperlipidemia    Hypertension    history of htn, no meds now   Ulcer    x2   Vaginal Pap smear, abnormal     Family History  Adopted: Yes  Family history unknown: Yes    Past Surgical History:  Procedure Laterality Date   ANTERIOR CRUCIATE LIGAMENT REPAIR Left 09/04/2016   Procedure: LEFT KNEE MEDIAL COLLATERAL LIGAMENT REPAIR, ANTERIOR CRUCIATE LIGAMENT RECONSTRUCTION, MENISCAL DEBRIDEMENT VERSUS REPAIR;  Surgeon: Cammy Copa, MD;  Location: [facility];  Service: Orthopedics;  Laterality: Left;   APPENDECTOMY     CHOLECYSTECTOMY N/A 08/20/2022   Procedure: LAPAROSCOPIC CHOLECYSTECTOMY;  Surgeon: Fritzi Mandes, MD;  Location: [facility];  Service: General;  Laterality: N/A;   ESOPHAGOGASTRODUODENOSCOPY ENDOSCOPY     at least 4   JOINT REPLACEMENT     LAPAROSCOPIC APPENDECTOMY N/A 02/23/2015    Procedure: APPENDECTOMY LAPAROSCOPIC;  Surgeon: Gaynelle Adu, MD;  Location: [facility];  Service: General;  Laterality: N/A;   SPINAL CORD STIMULATOR IMPLANT  2020   [employer]tr[employer]   TOTAL HIP ARTHROPLASTY Left 02/06/2013   Procedure: LEFT TOTAL HIP ARTHROPLASTY ANTERIOR APPROACH;  Surgeon: Kathryne Hitch, MD;  Location: [facility];  Service: Orthopedics;  Laterality: Left;   Social History   Occupational History   Not on file  Tobacco Use   Smoking status: Former    Packs/day: 0.25    Years: 20.00    Additional pack years: 0.00    Total pack years: 5.00    Types: Cigarettes    Quit date: 08/13/2013    Years since quitting: 9.4   Smokeless tobacco: Never  Vaping Use   Vaping Use: Never used  Substance and Sexual Activity   Alcohol use: Yes  Comment: few glasses of wine a week   Drug use: No   Sexual activity: Not on file

## 2023-01-18 ENCOUNTER — Other Ambulatory Visit: Payer: Self-pay

## 2023-01-18 ENCOUNTER — Encounter (HOSPITAL_COMMUNITY): Payer: Self-pay | Admitting: Orthopedic Surgery

## 2023-01-18 ENCOUNTER — Encounter: Payer: Self-pay | Admitting: Family Medicine

## 2023-01-18 DIAGNOSIS — E7849 Other hyperlipidemia: Secondary | ICD-10-CM

## 2023-01-18 MED ORDER — SIMVASTATIN 20 MG PO TABS: ORAL_TABLET | ORAL | 3 refills | Status: DC

## 2023-01-18 NOTE — Progress Notes (Signed)
PCP - Dr Warner Mccreedy  Chest x-ray - n/a EKG - 06/13/22 Stress Test - n/a ECHO - n/a Cardiac Cath - n/a  ICD Pacemaker/Loop - n/a  Sleep Study - n/a CPAP - none  Diabetes Type 2 Patient does not check her CBGs.   Do not take Metformin on the morning of surgery.  ERAS: Clear liquids til 1:45 pm on DOS  Anesthesia review: Yes  STOP now taking any Aspirin (unless otherwise instructed by your surgeon), Aleve, Naproxen, Ibuprofen, Motrin, Advil, Goody's, BC's, all herbal medications, fish oil, and all vitamins.   Coronavirus Screening Do you have any of the following symptoms:  Cough yes/no: No Fever (>100.108F)  yes/no: No Runny nose yes/no: No Sore throat yes/no: No Difficulty breathing/shortness of breath  yes/no: No  Have you traveled in the last 14 days and where? yes/no: No  Patient verbalized understanding of instructions that were given via phone.

## 2023-01-22 ENCOUNTER — Ambulatory Visit (HOSPITAL_COMMUNITY): Payer: BC Managed Care – PPO

## 2023-01-22 ENCOUNTER — Other Ambulatory Visit: Payer: Self-pay | Admitting: Surgical

## 2023-01-22 ENCOUNTER — Other Ambulatory Visit: Payer: Self-pay

## 2023-01-22 ENCOUNTER — Encounter (HOSPITAL_COMMUNITY)
Admission: RE | Disposition: A | Discharge status: Home / Self Care | Payer: Self-pay | Source: Home / Self Care | Attending: Orthopedic Surgery

## 2023-01-22 ENCOUNTER — Ambulatory Visit (HOSPITAL_COMMUNITY)
Admission: RE | Admit: 2023-01-22 | Discharge: 2023-01-22 | Disposition: A | Discharge status: Home / Self Care | Payer: Worker's Compensation | Attending: Orthopedic Surgery | Admitting: Orthopedic Surgery

## 2023-01-22 ENCOUNTER — Encounter (HOSPITAL_COMMUNITY): Payer: Self-pay | Admitting: Orthopedic Surgery

## 2023-01-22 ENCOUNTER — Ambulatory Visit (HOSPITAL_COMMUNITY): Payer: Worker's Compensation | Admitting: Physician Assistant

## 2023-01-22 ENCOUNTER — Ambulatory Visit (HOSPITAL_BASED_OUTPATIENT_CLINIC_OR_DEPARTMENT_OTHER): Payer: Worker's Compensation | Admitting: Physician Assistant

## 2023-01-22 DIAGNOSIS — K219 Gastro-esophageal reflux disease without esophagitis: Secondary | ICD-10-CM | POA: Insufficient documentation

## 2023-01-22 DIAGNOSIS — E785 Hyperlipidemia, unspecified: Secondary | ICD-10-CM | POA: Insufficient documentation

## 2023-01-22 DIAGNOSIS — Z79899 Other long term (current) drug therapy: Secondary | ICD-10-CM | POA: Insufficient documentation

## 2023-01-22 DIAGNOSIS — Y9229 Other specified public building as the place of occurrence of the external cause: Secondary | ICD-10-CM | POA: Insufficient documentation

## 2023-01-22 DIAGNOSIS — S82891A Other fracture of right lower leg, initial encounter for closed fracture: Secondary | ICD-10-CM

## 2023-01-22 DIAGNOSIS — W1789XA Other fall from one level to another, initial encounter: Secondary | ICD-10-CM | POA: Diagnosis not present

## 2023-01-22 DIAGNOSIS — Z7984 Long term (current) use of oral hypoglycemic drugs: Secondary | ICD-10-CM | POA: Insufficient documentation

## 2023-01-22 DIAGNOSIS — I10 Essential (primary) hypertension: Secondary | ICD-10-CM

## 2023-01-22 DIAGNOSIS — Z96642 Presence of left artificial hip joint: Secondary | ICD-10-CM | POA: Insufficient documentation

## 2023-01-22 DIAGNOSIS — Z87891 Personal history of nicotine dependence: Secondary | ICD-10-CM

## 2023-01-22 DIAGNOSIS — Y9389 Activity, other specified: Secondary | ICD-10-CM | POA: Insufficient documentation

## 2023-01-22 DIAGNOSIS — Z793 Long term (current) use of hormonal contraceptives: Secondary | ICD-10-CM | POA: Insufficient documentation

## 2023-01-22 DIAGNOSIS — S8261XA Displaced fracture of lateral malleolus of right fibula, initial encounter for closed fracture: Secondary | ICD-10-CM

## 2023-01-22 DIAGNOSIS — E119 Type 2 diabetes mellitus without complications: Secondary | ICD-10-CM

## 2023-01-22 DIAGNOSIS — Z01818 Encounter for other preprocedural examination: Secondary | ICD-10-CM

## 2023-01-22 HISTORY — DX: Herpesviral infection, unspecified: B00.9

## 2023-01-22 HISTORY — PX: ORIF ANKLE FRACTURE: SHX5408

## 2023-01-22 HISTORY — DX: Other psychoactive substance abuse, uncomplicated: F19.10

## 2023-01-22 LAB — GLUCOSE, CAPILLARY
Glucose-Capillary: 97 mg/dL (ref 70–99)
Glucose-Capillary: 127 mg/dL — ABNORMAL HIGH (ref 70–99)

## 2023-01-22 LAB — BASIC METABOLIC PANEL
Anion gap: 12 (ref 5–15)
BUN: 12 mg/dL (ref 6–20)
CO2: 19 mmol/L — ABNORMAL LOW (ref 22–32)
Calcium: 9.3 mg/dL (ref 8.9–10.3)
Chloride: 105 mmol/L (ref 98–111)
Creatinine, Ser: 0.6 mg/dL (ref 0.44–1.00)
GFR, Estimated: >60 mL/min (ref >60)
Glucose, Bld: 106 mg/dL — ABNORMAL HIGH (ref 70–99)
Potassium: 3.7 mmol/L (ref 3.5–5.1)
Sodium: 136 mmol/L (ref 135–145)

## 2023-01-22 LAB — CBC
HCT: 38.8 % (ref 36.0–46.0)
Hemoglobin: 12.4 g/dL (ref 12.0–15.0)
MCH: 29 pg (ref 26.0–34.0)
MCHC: 32 g/dL (ref 30.0–36.0)
MCV: 90.9 fL (ref 80.0–100.0)
Platelets: 296 10*3/uL (ref 150–400)
RBC: 4.27 MIL/uL (ref 3.87–5.11)
RDW: 12.4 % (ref 11.5–15.5)
WBC: 4.9 10*3/uL (ref 4.0–10.5)
nRBC: 0 % (ref 0.0–0.2)

## 2023-01-22 LAB — POCT PREGNANCY, URINE: Preg Test, Ur: NEGATIVE

## 2023-01-22 SURGERY — OPEN REDUCTION INTERNAL FIXATION (ORIF) ANKLE FRACTURE
Anesthesia: General | Site: Ankle | Laterality: Right

## 2023-01-22 MED ORDER — POVIDONE-IODINE 7.5 % EX SOLN: Freq: Once | CUTANEOUS | Status: DC

## 2023-01-22 MED ORDER — ORAL CARE MOUTH RINSE: 15.0000 mL | Freq: Once | OROMUCOSAL | Status: AC

## 2023-01-22 MED ORDER — PROPOFOL 10 MG/ML IV BOLUS
INTRAVENOUS | Status: AC
  Filled 2023-01-22: qty 20

## 2023-01-22 MED ORDER — ACETAMINOPHEN 500 MG PO TABS
1000.0000 mg | ORAL_TABLET | Freq: Once | ORAL | Status: DC
  Filled 2023-01-22: qty 2

## 2023-01-22 MED ORDER — BUPIVACAINE-EPINEPHRINE (PF) 0.5% -1:200000 IJ SOLN
INTRAMUSCULAR | Status: DC | PRN
  Administered 2023-01-22: 30 mL via PERINEURAL

## 2023-01-22 MED ORDER — CLONIDINE HCL (ANALGESIA) 100 MCG/ML EP SOLN
EPIDURAL | Status: DC | PRN
  Administered 2023-01-22 (×2): 50 ug

## 2023-01-22 MED ORDER — FENTANYL CITRATE (PF) 250 MCG/5ML IJ SOLN
INTRAMUSCULAR | Status: AC
  Filled 2023-01-22: qty 5

## 2023-01-22 MED ORDER — FENTANYL CITRATE (PF) 250 MCG/5ML IJ SOLN
INTRAMUSCULAR | Status: DC | PRN
  Administered 2023-01-22 (×2): 50 ug via INTRAVENOUS

## 2023-01-22 MED ORDER — ROPIVACAINE HCL 5 MG/ML IJ SOLN
INTRAMUSCULAR | Status: DC | PRN
  Administered 2023-01-22: 15 mL via PERINEURAL

## 2023-01-22 MED ORDER — AMISULPRIDE (ANTIEMETIC) 5 MG/2ML IV SOLN
INTRAVENOUS | Status: AC
  Filled 2023-01-22: qty 4

## 2023-01-22 MED ORDER — MIDAZOLAM HCL 2 MG/2ML IJ SOLN: 1.0000 mg | Freq: Once | INTRAMUSCULAR | Status: AC

## 2023-01-22 MED ORDER — DEXMEDETOMIDINE HCL IN NACL 80 MCG/20ML IV SOLN
INTRAVENOUS | Status: DC | PRN
  Administered 2023-01-22: 12 ug via INTRAVENOUS

## 2023-01-22 MED ORDER — INSULIN ASPART 100 UNIT/ML IJ SOLN: 0.0000 [IU] | INTRAMUSCULAR | Status: DC | PRN

## 2023-01-22 MED ORDER — AMISULPRIDE (ANTIEMETIC) 5 MG/2ML IV SOLN
10.0000 mg | Freq: Once | INTRAVENOUS | Status: AC | PRN
  Administered 2023-01-22: 10 mg via INTRAVENOUS

## 2023-01-22 MED ORDER — DEXAMETHASONE SODIUM PHOSPHATE 10 MG/ML IJ SOLN
INTRAMUSCULAR | Status: DC | PRN
  Administered 2023-01-22: 10 mg via INTRAVENOUS

## 2023-01-22 MED ORDER — ONDANSETRON HCL 4 MG/2ML IJ SOLN
INTRAMUSCULAR | Status: DC | PRN
  Administered 2023-01-22: 4 mg via INTRAVENOUS

## 2023-01-22 MED ORDER — PROPOFOL 10 MG/ML IV BOLUS
INTRAVENOUS | Status: DC | PRN
  Administered 2023-01-22: 200 mg via INTRAVENOUS
  Administered 2023-01-22: 40 mg via INTRAVENOUS

## 2023-01-22 MED ORDER — ASPIRIN 81 MG PO CHEW: 81.0000 mg | CHEWABLE_TABLET | Freq: Two times a day (BID) | ORAL | 0 refills | Status: DC

## 2023-01-22 MED ORDER — FENTANYL CITRATE (PF) 100 MCG/2ML IJ SOLN: 25.0000 ug | INTRAMUSCULAR | Status: DC | PRN

## 2023-01-22 MED ORDER — MIDAZOLAM HCL 2 MG/2ML IJ SOLN
INTRAMUSCULAR | Status: AC
  Administered 2023-01-22: 1 mg via INTRAVENOUS
  Filled 2023-01-22: qty 2

## 2023-01-22 MED ORDER — LACTATED RINGERS IV SOLN: INTRAVENOUS | Status: DC

## 2023-01-22 MED ORDER — FENTANYL CITRATE (PF) 100 MCG/2ML IJ SOLN: 50.0000 ug | Freq: Once | INTRAMUSCULAR | Status: AC

## 2023-01-22 MED ORDER — VANCOMYCIN HCL 500 MG IV SOLR
INTRAVENOUS | Status: AC
  Filled 2023-01-22: qty 10

## 2023-01-22 MED ORDER — BUPIVACAINE HCL (PF) 0.25 % IJ SOLN
INTRAMUSCULAR | Status: AC
  Filled 2023-01-22: qty 30

## 2023-01-22 MED ORDER — LIDOCAINE 2% (20 MG/ML) 5 ML SYRINGE
INTRAMUSCULAR | Status: DC | PRN
  Administered 2023-01-22: 60 mg via INTRAVENOUS

## 2023-01-22 MED ORDER — CEFAZOLIN SODIUM-DEXTROSE 2-4 GM/100ML-% IV SOLN
2.0000 g | INTRAVENOUS | Status: AC
  Administered 2023-01-22: 2 g via INTRAVENOUS
  Filled 2023-01-22: qty 100

## 2023-01-22 MED ORDER — METHOCARBAMOL 500 MG PO TABS: 500.0000 mg | ORAL_TABLET | Freq: Three times a day (TID) | ORAL | 1 refills | Status: DC | PRN

## 2023-01-22 MED ORDER — FENTANYL CITRATE (PF) 100 MCG/2ML IJ SOLN
INTRAMUSCULAR | Status: AC
  Administered 2023-01-22: 50 ug via INTRAVENOUS
  Filled 2023-01-22: qty 2

## 2023-01-22 MED ORDER — OXYCODONE HCL 5 MG PO TABS: 5.0000 mg | ORAL_TABLET | ORAL | 0 refills | Status: DC | PRN

## 2023-01-22 MED ORDER — 0.9 % SODIUM CHLORIDE (POUR BTL) OPTIME
TOPICAL | Status: DC | PRN
  Administered 2023-01-22 (×2): 1000 mL

## 2023-01-22 MED ORDER — POVIDONE-IODINE 10 % EX SWAB
2.0000 | Freq: Once | CUTANEOUS | Status: AC
  Administered 2023-01-22: 2 via TOPICAL

## 2023-01-22 MED ORDER — CHLORHEXIDINE GLUCONATE 0.12 % MT SOLN
15.0000 mL | Freq: Once | OROMUCOSAL | Status: AC
  Administered 2023-01-22: 15 mL via OROMUCOSAL
  Filled 2023-01-22: qty 15

## 2023-01-22 MED ORDER — ONDANSETRON HCL 4 MG/2ML IJ SOLN
INTRAMUSCULAR | Status: AC
  Filled 2023-01-22: qty 2

## 2023-01-22 MED ORDER — VANCOMYCIN HCL 500 MG IV SOLR
INTRAVENOUS | Status: DC | PRN
  Administered 2023-01-22: 500 mg

## 2023-01-22 SURGICAL SUPPLY — 82 items
BIT DRILL 2.7MM OVERBIT QC (BIT) IMPLANT
BIT DRILL OVR 3.5AO QC SHRT SM (DRILL) IMPLANT
BIT DRILL QC 2.0 SHORT EVOS SM (DRILL) IMPLANT
BIT DRILL QC 2.5MM SHRT EVO SM (DRILL) IMPLANT
BLADE SURG 10 STRL SS (BLADE) IMPLANT
BNDG CMPR 5X4 KNIT ELC UNQ LF (GAUZE/BANDAGES/DRESSINGS) ×1
BNDG CMPR 5X6 CHSV STRCH STRL (GAUZE/BANDAGES/DRESSINGS)
BNDG CMPR 5X62 HK CLSR LF (GAUZE/BANDAGES/DRESSINGS) ×1
BNDG CMPR 9X4 STRL LF SNTH (GAUZE/BANDAGES/DRESSINGS) ×1
BNDG CMPR MED 10X6 ELC LF (GAUZE/BANDAGES/DRESSINGS)
BNDG COHESIVE 6X5 TAN ST LF (GAUZE/BANDAGES/DRESSINGS) IMPLANT
BNDG ELASTIC 4INX 5YD STR LF (GAUZE/BANDAGES/DRESSINGS) IMPLANT
BNDG ELASTIC 4X5.8 VLCR STR LF (GAUZE/BANDAGES/DRESSINGS) ×2 IMPLANT
BNDG ELASTIC 6INX 5YD STR LF (GAUZE/BANDAGES/DRESSINGS) IMPLANT
BNDG ELASTIC 6X10 VLCR STRL LF (GAUZE/BANDAGES/DRESSINGS) ×2 IMPLANT
BNDG ESMARK 4X9 LF (GAUZE/BANDAGES/DRESSINGS) ×2 IMPLANT
BNDG GAUZE DERMACEA FLUFF 4 (GAUZE/BANDAGES/DRESSINGS) ×2 IMPLANT
BNDG GZE DERMACEA 4 6PLY (GAUZE/BANDAGES/DRESSINGS)
COVER SURGICAL LIGHT HANDLE (MISCELLANEOUS) ×2 IMPLANT
DRAPE C-ARM 42X72 X-RAY (DRAPES) ×2 IMPLANT
DRAPE INCISE IOBAN 66X45 STRL (DRAPES) IMPLANT
DRAPE SURG 17X23 STRL (DRAPES) ×2 IMPLANT
DRAPE U-SHAPE 47X51 STRL (DRAPES) ×2 IMPLANT
DRILL 2.7MM OVERDRILL QC (BIT) ×1
DRILL OVER 3.5 AO QC SHORT SM (DRILL) ×1
DRILL QC 2.0 SHORT EVOS SM (DRILL) ×1
DRILL QC 2.5MM SHORT EVOS SM (DRILL) ×1
DRSG AQUACEL AG ADV 3.5X10 (GAUZE/BANDAGES/DRESSINGS) IMPLANT
DRSG EMULSION OIL 3X3 NADH (GAUZE/BANDAGES/DRESSINGS) IMPLANT
DURAPREP 26ML APPLICATOR (WOUND CARE) IMPLANT
ELECT CAUTERY BLADE 6.4 (BLADE) ×2 IMPLANT
ELECT REM PT RETURN 9FT ADLT (ELECTROSURGICAL) ×1
ELECTRODE REM PT RTRN 9FT ADLT (ELECTROSURGICAL) ×2 IMPLANT
GAUZE PAD ABD 7.5X8 STRL (GAUZE/BANDAGES/DRESSINGS) IMPLANT
GAUZE PAD ABD 8X10 STRL (GAUZE/BANDAGES/DRESSINGS) ×2 IMPLANT
GAUZE SPONGE 4X4 12PLY STRL (GAUZE/BANDAGES/DRESSINGS) ×2 IMPLANT
GAUZE XEROFORM 5X9 LF (GAUZE/BANDAGES/DRESSINGS) ×2 IMPLANT
GLOVE BIOGEL PI IND STRL 7.0 (GLOVE) ×2 IMPLANT
GLOVE BIOGEL PI IND STRL 8 (GLOVE) ×2 IMPLANT
GLOVE ECLIPSE 7.0 STRL STRAW (GLOVE) ×2 IMPLANT
GLOVE ECLIPSE 8.0 STRL XLNG CF (GLOVE) ×2 IMPLANT
GOWN STRL REUS W/ TWL LRG LVL3 (GOWN DISPOSABLE) ×6 IMPLANT
GOWN STRL REUS W/TWL LRG LVL3 (GOWN DISPOSABLE) ×3
KIT BASIN OR (CUSTOM PROCEDURE TRAY) ×2 IMPLANT
KIT TURNOVER KIT B (KITS) ×2 IMPLANT
MANIFOLD NEPTUNE II (INSTRUMENTS) ×2 IMPLANT
NDL HYPO 25GX1X1/2 BEV (NEEDLE) ×2 IMPLANT
NEEDLE HYPO 25GX1X1/2 BEV (NEEDLE) ×1 IMPLANT
NS IRRIG 1000ML POUR BTL (IV SOLUTION) ×2 IMPLANT
PACK ORTHO EXTREMITY (CUSTOM PROCEDURE TRAY) ×2 IMPLANT
PAD ARMBOARD 7.5X6 YLW CONV (MISCELLANEOUS) ×4 IMPLANT
PAD CAST 4YDX4 CTTN HI CHSV (CAST SUPPLIES) ×4 IMPLANT
PAD CAST CTTN 4X4 STRL (SOFTGOODS) IMPLANT
PADDING CAST COTTON 4X4 STRL (CAST SUPPLIES) ×1
PADDING CAST COTTON 4X4 STRL (SOFTGOODS) ×1
PADDING CAST COTTON 6X4 STRL (CAST SUPPLIES) IMPLANT
PLATE FIB EVOS 2.7/3.5 7H R103 (Plate) IMPLANT
SCREW CORT 2.7X15 T8 ST EVOS (Screw) IMPLANT
SCREW CORT 2.7X16 STAR T8 EVOS (Screw) IMPLANT
SCREW CORT 2.7X20 T8 ST EVOS (Screw) IMPLANT
SCREW CORT 2.7X22 T8 ST EVOS (Screw) IMPLANT
SCREW CORT 3.5X13MM (Screw) IMPLANT
SCREW CORT 3.5X15 ST EVOS (Screw) IMPLANT
SCREW CORT EVOS ST 3.5X12 (Screw) IMPLANT
SCREW EVOS 2.7X18 LOCK T8 (Screw) IMPLANT
SCREW LOCK 3.5X15 EVOS (Screw) IMPLANT
SPLINT PLASTER CAST FAST 5X30 (CAST SUPPLIES) IMPLANT
STOCKINETTE IMPERVIOUS 9X36 MD (GAUZE/BANDAGES/DRESSINGS) IMPLANT
SUCTION TUBE FRAZIER 10FR DISP (SUCTIONS) ×2 IMPLANT
SUT ETHILON 3 0 FSL (SUTURE) IMPLANT
SUT ETHILON 3 0 PS 1 (SUTURE) IMPLANT
SUT MNCRL AB 3-0 PS2 18 (SUTURE) IMPLANT
SUT VIC AB 2-0 CT2 27 (SUTURE) IMPLANT
SUT VIC AB 2-0 CTB1 (SUTURE) ×2 IMPLANT
SUT VIC AB 3-0 SH 27 (SUTURE)
SUT VIC AB 3-0 SH 27X BRD (SUTURE) ×2 IMPLANT
SYR CONTROL 10ML LL (SYRINGE) ×2 IMPLANT
TOWEL GREEN STERILE (TOWEL DISPOSABLE) ×2 IMPLANT
TOWEL GREEN STERILE FF (TOWEL DISPOSABLE) ×2 IMPLANT
TUBE CONNECTING 12X1/4 (SUCTIONS) ×2 IMPLANT
WATER STERILE IRR 1000ML POUR (IV SOLUTION) ×2 IMPLANT
YANKAUER SUCT BULB TIP NO VENT (SUCTIONS) IMPLANT

## 2023-01-22 NOTE — Anesthesia Procedure Notes (Signed)
Anesthesia Regional Block: Popliteal block   Pre-Anesthetic Checklist: , timeout performed,  Correct Patient, Correct Site, Correct Laterality,  Correct Procedure, Correct Position, site marked,  Risks and benefits discussed,  Surgical consent,  Pre-op evaluation,  At surgeon's request and post-op pain management  Laterality: Right  Prep: chloraprep       Needles:  Injection technique: Single-shot  Needle Type: Echogenic Needle     Needle Length: 9cm  Needle Gauge: 21     Additional Needles:   Procedures:,,,, ultrasound used (permanent image in chart),,    Narrative:  Start time: 01/22/2023 3:22 PM End time: 01/22/2023 3:29 PM Injection made incrementally with aspirations every 5 mL.  Performed by: Personally  Anesthesiologist: Marcene Duos, MD

## 2023-01-22 NOTE — Brief Op Note (Signed)
   01/22/2023  5:41 PM  PATIENT:  Lisa [name]  52 y.o. female  PRE-OPERATIVE DIAGNOSIS:  right ankle fracture  POST-OPERATIVE DIAGNOSIS:  right ankle fracture  PROCEDURE:  Procedure(s): OPEN REDUCTION INTERNAL FIXATION (ORIF) RIGHT ANKLE FRACTURE  SURGEON:  Surgeon(s): Cammy Copa, MD  ASSISTANT: magnant pa  ANESTHESIA:   general  EBL: 15 ml    No intake/output data recorded.  BLOOD ADMINISTERED: none  DRAINS: none   LOCAL MEDICATIONS USED:  vancomycin  SPECIMEN:  No Specimen  COUNTS:  YES  TOURNIQUET:  * No tourniquets in log *  DICTATION: .Other Dictation: Dictation Number done  PLAN OF CARE: Discharge to home after PACU  PATIENT DISPOSITION:  PACU - hemodynamically stable

## 2023-01-22 NOTE — Anesthesia Preprocedure Evaluation (Signed)
Anesthesia Evaluation  Patient identified by MRN, date of birth, ID band Patient awake    Reviewed: Allergy & Precautions, NPO status , Patient's Chart, lab work & pertinent test results  Airway Mallampati: II  TM Distance: >3 FB Neck ROM: Full    Dental  (+) Dental Advisory Given   Pulmonary former smoker   breath sounds clear to auscultation       Cardiovascular hypertension, Pt. on medications  Rhythm:Regular Rate:Normal     Neuro/Psych negative neurological ROS     GI/Hepatic Neg liver ROS,GERD  ,,  Endo/Other  diabetes, Type 2, Oral Hypoglycemic Agents    Renal/GU negative Renal ROS     Musculoskeletal   Abdominal   Peds  Hematology negative hematology ROS (+)   Anesthesia Other Findings   Reproductive/Obstetrics                             Anesthesia Physical Anesthesia Plan  ASA: 2  Anesthesia Plan: General   Post-op Pain Management: Regional block* and Tylenol PO (pre-op)*   Induction: Intravenous  PONV Risk Score and Plan: 3 and Dexamethasone, Ondansetron, Midazolam and Treatment may vary due to age or medical condition  Airway Management Planned: LMA  Additional Equipment:   Intra-op Plan:   Post-operative Plan: Extubation in OR  Informed Consent: I have reviewed the patients History and Physical, chart, labs and discussed the procedure including the risks, benefits and alternatives for the proposed anesthesia with the patient or authorized representative who has indicated his/her understanding and acceptance.     Dental advisory given  Plan Discussed with: CRNA  Anesthesia Plan Comments:        Anesthesia Quick Evaluation

## 2023-01-22 NOTE — Op Note (Unsigned)
NAME: Lisa, [name] MEDICAL RECORD NO: [id number] ACCOUNT NO: [id number] DATE OF BIRTH: 1971/05/27 FACILITY: [facility] LOCATION: [facility] PHYSICIAN: Graylin Shiver. August Saucer, MD  Operative Report   DATE OF PROCEDURE: 01/22/2023  PREOPERATIVE DIAGNOSIS:  Right lateral malleolus fracture.  POSTOPERATIVE DIAGNOSIS:  Right lateral malleolus fracture.  PROCEDURE:  Open reduction and internal fixation, right lateral malleolar fracture.  SURGEON:  Graylin Shiver. August Saucer, MD  ASSISTANT:  Karenann Cai, PA.  INDICATIONS:  This is a 52 year old patient with right ankle pain following an injury about 2 weeks ago.  She presents for operative management after explanation of risks and benefits.  She did have some instability of the mortise, but no syndesmotic  instability on fluoroscopy at the office.  DESCRIPTION OF PROCEDURE:  The patient was brought to the operating room where general anesthetic was induced.  Preoperative antibiotics administered.  Timeout was called.  Right leg was prescrubbed with alcohol and Betadine, allowed to air dry, prepped  with DuraPrep solution and draped in sterile manner.  Ioban used to cover the operative field.  Leg was elevated and ankle Esmarch was utilized for about 30 minutes.  Skin and subcutaneous tissue were sharply divided beginning at the inferior aspect of  the lateral malleolus extending proximally about 10 cm.  Care was taken to avoid injury to the superficial peroneal nerve, sensory branches.  Soft tissue elevation was performed around the bone.  The bone was visualized.  Fracture edges were reduced.   Lag screw was placed.  Smith and Nephew plate was then applied with locking screws distally and nonlocking screws proximally.  Overall, a very nice reduction was achieved.  Mortise was stable.  No widening in the clear space more than a mm.  Syndesmosis  also, stable.  Following correct confirmation of screw placement the ankle Esmarch was released.  Thorough irrigation was  performed.  Incision then closed after placing Vancomycin on the plate using 2-0 Vicryl suture, and 3-0 nylon.  Well padded  posterior splint was applied.  The patient tolerated the procedure well without immediate complications, transferred to the recovery room in stable condition.  Luke's assistance was required at all times for retraction, opening, closing, mobilization of  tissue.  His assistance was a medical necessity.   PUS D: 01/22/2023 5:44:34 pm T: 01/22/2023 7:25:00 pm  JOB: [id number]/ [id number]

## 2023-01-22 NOTE — Transfer of Care (Signed)
Immediate Anesthesia Transfer of Care Note  Patient: Lisa [name]  Procedure(s) Performed: OPEN REDUCTION INTERNAL FIXATION (ORIF) RIGHT ANKLE FRACTURE (Right: Ankle)  Patient Location: PACU  Anesthesia Type:General  Level of Consciousness: awake, alert , oriented, and patient cooperative  Airway & Oxygen Therapy: Patient Spontanous Breathing  Post-op Assessment: Report given to RN and Post -op Vital signs reviewed and stable  Post vital signs: Reviewed and stable  Last Vitals:  Vitals Value Taken Time  BP 144/93 01/22/23 1815  Temp    Pulse 94 01/22/23 1815  Resp 16 01/22/23 1815  SpO2 97 % 01/22/23 1815  Vitals shown include unvalidated device data.  Last Pain:  Vitals:   01/22/23 1443  TempSrc: Oral  PainSc: 5          Complications: No notable events documented.

## 2023-01-22 NOTE — Anesthesia Procedure Notes (Signed)
Anesthesia Regional Block: Adductor canal block   Pre-Anesthetic Checklist: , timeout performed,  Correct Patient, Correct Site, Correct Laterality,  Correct Procedure, Correct Position, site marked,  Risks and benefits discussed,  Surgical consent,  Pre-op evaluation,  At surgeon's request and post-op pain management  Laterality: Right  Prep: chloraprep       Needles:  Injection technique: Single-shot  Needle Type: Echogenic Needle     Needle Length: 9cm  Needle Gauge: 21     Additional Needles:   Procedures:,,,, ultrasound used (permanent image in chart),,    Narrative:  Start time: 01/22/2023 3:29 PM End time: 01/22/2023 3:34 PM Injection made incrementally with aspirations every 5 mL.  Performed by: Personally  Anesthesiologist: Marcene Duos, MD

## 2023-01-22 NOTE — H&P (Signed)
Lisa [name] is an 52 y.o. female.   Chief Complaint: right ankle pain HPI: Lisa [name] is a 52 y.o. female who presents to the office reporting right ankle pain and left ankle pain.  Date of injury 01/11/2023.  She stepped off a curb at work during a Loss adjuster, chartered.  She works as a Scientist, physiological at [employer].  She reported bilateral ankle pain interestingly left more than right.  Did have a lot of bruising and swelling.  Played soccer and describes history of mild instability in the left ankle but she "really rolled it" when she stepped off the curb.  Also sustained right ankle injury at that time.  Subsequent radiographs demonstrated displaced lateral malleolus fracture.  She has had hip replacement and ACL reconstruction on the left.  No personal or family history of DVT or pulmonary embolism. Fluoroscopy of the right ankle demonstrated no syndesmotic injury..    Past Medical History:  Diagnosis Date   Anemia    hx of   Anxiety 20 years ago   Depression 20 years ago   Diabetes mellitus without complication (HCC)    type 2   GERD (gastroesophageal reflux disease)    Headache    Heart murmur    in childhood, no problems as an adult   History of blood transfusion 08/21/2022   History of kidney stones    passed stones   HSV (herpes simplex virus) infection    Hyperlipidemia    Hypertension    history of htn, no meds now   Substance abuse (HCC)    last use in her 30s, marijuana cocaine and crack   Ulcer    x2   Vaginal Pap smear, abnormal     Past Surgical History:  Procedure Laterality Date   ANTERIOR CRUCIATE LIGAMENT REPAIR Left 09/04/2016   Procedure: LEFT KNEE MEDIAL COLLATERAL LIGAMENT REPAIR, ANTERIOR CRUCIATE LIGAMENT RECONSTRUCTION, MENISCAL DEBRIDEMENT VERSUS REPAIR;  Surgeon: Cammy Copa, MD;  Location: [facility];  Service: Orthopedics;  Laterality: Left;   APPENDECTOMY     CHOLECYSTECTOMY N/A 08/20/2022   Procedure: LAPAROSCOPIC CHOLECYSTECTOMY;   Surgeon: Fritzi Mandes, MD;  Location: [facility] OR;  Service: General;  Laterality: N/A;   ESOPHAGOGASTRODUODENOSCOPY ENDOSCOPY     at least 4 last one 07/2022   JOINT REPLACEMENT     LAPAROSCOPIC APPENDECTOMY N/A 02/23/2015   Procedure: APPENDECTOMY LAPAROSCOPIC;  Surgeon: Gaynelle Adu, MD;  Location: [facility];  Service: General;  Laterality: N/A;   SPINAL CORD STIMULATOR IMPLANT  2020   [employer]   TOTAL HIP ARTHROPLASTY Left 02/06/2013   Procedure: LEFT TOTAL HIP ARTHROPLASTY ANTERIOR APPROACH;  Surgeon: Kathryne Hitch, MD;  Location: [facility];  Service: Orthopedics;  Laterality: Left;    Family History  Adopted: Yes  Family history unknown: Yes   Social History:  reports that she quit smoking about 9 years ago. Her smoking use included cigarettes. She has a 5.00 pack-year smoking history. She has never used smokeless tobacco. She reports current alcohol use of about 3.0 - 4.0 standard drinks of alcohol per week. She reports current drug use. Drugs: Marijuana, Cocaine, and "Crack" cocaine.  Allergies:  Allergies  Allergen Reactions   Nsaids Other (See Comments)    ulcers   Nitroglycerin     Pt took Nitroglycerin patches - pt stated, "Made my skin bright red when I changed my patch; it made my foot itch and burn"    Facility-Administered Medications Prior to Admission  Medication  Dose Route Frequency Provider Last Rate Last Admin   medroxyPROGESTERone (DEPO-PROVERA) injection 150 mg  150 mg Intramuscular Q90 days Copland, Gwenlyn Found, MD   150 mg at 12/19/22 1551   Medications Prior to Admission  Medication Sig Dispense Refill   acetaminophen (TYLENOL) 500 MG tablet Take 1 tablet (500 mg total) by mouth every 6 (six) hours as needed for mild pain. 30 tablet 0   Cholecalciferol (VITAMIN D-3 PO) Take 1 tablet by mouth daily.     Cyanocobalamin (B-12 PO) Take 1 tablet by mouth 2 (two) times a week.     doxylamine, Sleep, (UNISOM) 25 MG tablet Take 50 mg by mouth at bedtime as needed  for sleep.     latanoprost (XALATAN) 0.005 % ophthalmic solution Place 1 drop into both eyes at bedtime.     lisinopril (ZESTRIL) 10 MG tablet TAKE 1 TABLET BY MOUTH EVERY DAY 30 tablet 8   MAGNESIUM PO Take 1 tablet by mouth daily.     medroxyPROGESTERone (DEPO-PROVERA) 150 MG/ML injection Inject 1 mL (150 mg total) into the muscle once. (Patient taking differently: Inject 150 mg into the muscle every 3 (three) months. Last injection 12/19/22) 1 mL 0   metFORMIN (GLUCOPHAGE) 500 MG tablet Take 1 tablet (500 mg total) by mouth daily. 90 tablet 3   pantoprazole (PROTONIX) 40 MG tablet Take 1 tablet (40 mg total) by mouth daily. (Patient taking differently: Take 40 mg by mouth 2 (two) times daily.) 30 tablet 8   polycarbophil (FIBERCON) 625 MG tablet Take 1,250 mg by mouth 2 (two) times daily.     POTASSIUM PO Take 1 tablet by mouth daily.     simvastatin (ZOCOR) 20 MG tablet TAKE 1 TABLET BY MOUTH EVERY DAY IN THE EVENING 90 tablet 3   SUMAtriptan (IMITREX) 50 MG tablet TAKE 1 TABLET BY MOUTH EVERY 2 (TWO) HOURS AS NEEDED FOR MIGRAINE. MAX 100 MG IN 24 HOURS 9 tablet 4   traMADol (ULTRAM) 50 MG tablet Take 1 tablet (50 mg total) by mouth every 6 (six) hours as needed. 30 tablet 0   traZODone (DESYREL) 50 MG tablet TAKE 1/2 TO 1 TABLET BY MOUTH AT BEDTIME AS NEEDED FOR SLEEP *MAY TAKE 2 AS NEEDED * (Patient taking differently: Take 100 mg by mouth at bedtime.) 180 tablet 4   valACYclovir (VALTREX) 1000 MG tablet TAKE 1 TABLET BY MOUTH EVERY DAY (Patient taking differently: Take 1,000 mg by mouth daily as needed (break outs).) 30 tablet 5    Results for orders placed or performed during the hospital encounter of 01/22/23 (from the past 48 hour(s))  Glucose, capillary     Status: None   Collection Time: 01/22/23  2:47 PM  Result Value Ref Range   Glucose-Capillary 97 70 - 99 mg/dL    Comment: Glucose reference range applies only to samples taken after fasting for at least 8 hours.   Comment 1 Notify  RN    Comment 2 Document in Chart    No results found.  Review of Systems  Musculoskeletal:  Positive for arthralgias.  All other systems reviewed and are negative.   Blood pressure 115/89, pulse 95, temperature 98.1 F (36.7 C), temperature source Oral, resp. rate 18, height 5\' 8"  (1.727 m), weight 82.6 kg, SpO2 96 %. Physical Exam Vitals reviewed.  HENT:     Head: Normocephalic.     Nose: Nose normal.     Mouth/Throat:     Mouth: Mucous membranes are moist.  Eyes:  Pupils: Pupils are equal, round, and reactive to light.  Cardiovascular:     Rate and Rhythm: Normal rate.     Pulses: Normal pulses.  Pulmonary:     Effort: Pulmonary effort is normal.  Abdominal:     General: Abdomen is flat.  Musculoskeletal:     Cervical back: Normal range of motion.  Skin:    General: Skin is warm.     Capillary Refill: Capillary refill takes less than 2 seconds.  Neurological:     General: No focal deficit present.     Mental Status: She is alert.  Psychiatric:        Mood and Affect: Mood normal.    Ortho exam demonstrates on that left-hand side palpable intact nontender anterior to posterior tib peroneal and Achilles tendon. She does have some increased anterior drawer and varus tilt testing on that lateral ankle on the left. No fibular tenderness. On the right-hand side there is bruising as there is on the left. Patient does have tenderness over the lateral malleolus but no medial sided tenderness. There is increased laxity with medial to lateral stress of the heel. Compartments are soft bilaterally. Pedal pulses are intact bilaterally with intact sensation on the dorsal and plantar aspect of the foot.   Assessment/Plan  Impression is right ankle lateral malleolar fracture with mortise instability on stress fluoroscopy but no syndesmotic instability is present.  The syndesmosis itself does look stable but there is about 3 to 4 mm of side-to-side motion within the mortise.  Her best  bet for stabilization would be fixation of that lateral malleolus fracture.  The risk and benefits are discussed include not limited to infection or vessel damage incomplete healing as well as ankle stiffness.  Patient is going on a cruise on 02/20/2023.  I think she will be moderately functional by that time.  There is a remote possibility that syndesmotic fixation will be required but that is really a game time decision based on how the ankle feels after plate fixation.  All questions answered.   Burnard Bunting, MD 01/22/2023, 3:00 PM

## 2023-01-22 NOTE — Anesthesia Procedure Notes (Signed)
Procedure Name: LMA Insertion Date/Time: 01/22/2023 4:13 PM  Performed by: Gus Puma, CRNAPre-anesthesia Checklist: Patient identified, Emergency Drugs available, Suction available and Patient being monitored Patient Re-evaluated:Patient Re-evaluated prior to induction Oxygen Delivery Method: Circle System Utilized Preoxygenation: Pre-oxygenation with 100% oxygen Induction Type: IV induction Ventilation: Mask ventilation without difficulty LMA: LMA inserted LMA Size: 4.0 Number of attempts: 1 Airway Equipment and Method: Bite block Placement Confirmation: positive ETCO2 Tube secured with: Tape Dental Injury: Teeth and Oropharynx as per pre-operative assessment

## 2023-01-23 NOTE — Anesthesia Postprocedure Evaluation (Signed)
Anesthesia Post Note  Patient: Lisa [name]  Procedure(s) Performed: OPEN REDUCTION INTERNAL FIXATION (ORIF) RIGHT ANKLE FRACTURE (Right: Ankle)     Patient location during evaluation: PACU Anesthesia Type: General Level of consciousness: awake and alert Pain management: pain level controlled Vital Signs Assessment: post-procedure vital signs reviewed and stable Respiratory status: spontaneous breathing, nonlabored ventilation, respiratory function stable and patient connected to nasal cannula oxygen Cardiovascular status: blood pressure returned to baseline and stable Postop Assessment: no apparent nausea or vomiting Anesthetic complications: no   No notable events documented.  Last Vitals:  Vitals:   01/22/23 1830 01/22/23 1845  BP: 126/89 135/89  Pulse: 83 77  Resp: 10 13  Temp:  36.4 C  SpO2: 97% 100%    Last Pain:  Vitals:   01/22/23 1845  TempSrc:   PainSc: 0-No pain                 Kennieth Rad

## 2023-01-24 ENCOUNTER — Encounter (HOSPITAL_COMMUNITY): Payer: Self-pay | Admitting: Orthopedic Surgery

## 2023-02-04 ENCOUNTER — Ambulatory Visit (INDEPENDENT_AMBULATORY_CARE_PROVIDER_SITE_OTHER): Payer: BC Managed Care – PPO | Admitting: Surgical

## 2023-02-04 ENCOUNTER — Other Ambulatory Visit: Payer: Self-pay

## 2023-02-04 ENCOUNTER — Telehealth: Payer: Self-pay

## 2023-02-04 DIAGNOSIS — Z9889 Other specified postprocedural states: Secondary | ICD-10-CM

## 2023-02-04 DIAGNOSIS — Z8781 Personal history of (healed) traumatic fracture: Secondary | ICD-10-CM

## 2023-02-04 DIAGNOSIS — M25572 Pain in left ankle and joints of left foot: Secondary | ICD-10-CM

## 2023-02-04 NOTE — Telephone Encounter (Signed)
Auth needed from w/c for left ankle injury to follow up with Lajoyce Corners

## 2023-02-05 ENCOUNTER — Telehealth: Payer: Self-pay

## 2023-02-05 NOTE — Telephone Encounter (Signed)
W/C requesting reason for referral to Dr Lajoyce Corners, please advise

## 2023-02-05 NOTE — Telephone Encounter (Signed)
-----   Message from Bridgett Larsson sent at 02/05/2023  3:03 PM EDT ----- Regarding: Secure: Referral to Dr. Lajoyce Corners, MD Leotis Shames  Received email question from [facility] regarding the referral to Dr. Lajoyce Corners, MD that Dr. August Saucer, MD has referred Ms. Duwaine Maxin other ankle.  See below. Thx Secundino Ginger 02-05-23 2:48p From:  [email or web address]  To Chipper Oman;  CC [email or web address]> [facility] [email or web address]>  #Caution - External email - see footer for warnings#  Hi Tisha,     What is the reason for the referral to Dr. Lajoyce Corners? The referral doesn't give a reason and I was curious as to why.   Thank you,     CHUBB Carlos American, RN, CCM, CDMS Senior Telephonic Nurse Case Production designer, theatre/television/film, [id number], [place], [place] [address], [place] [phone]    F [phone] [email or web address]

## 2023-02-06 NOTE — Telephone Encounter (Signed)
At the time of her injury where she broke her right ankle she also sustained an inversion sprain of her left ankle.  Both ankles had significant ecchymosis and bruising.  The left ankle at the time of surgery demonstrated significant laxity to varus tilt testing indicating incompetency of the lateral ligaments.  This is the referral for Dr. Lajoyce Corners.  Fluoroscopic imaging at the time of her right ankle surgery demonstrates the amount of surgical laxity in the left ankle.

## 2023-02-07 ENCOUNTER — Encounter: Payer: Self-pay | Admitting: Orthopedic Surgery

## 2023-02-07 NOTE — Telephone Encounter (Signed)
Dr. Lajoyce Corners will be happy to see the pt is there something that I need to do?

## 2023-02-07 NOTE — Progress Notes (Signed)
Post-Op Visit Note   Patient: Lisa [name]           Date of Birth: 11-21-1970           MRN: [id number] Visit Date: 02/04/2023 PCP: Pearline Cables, MD   Assessment & Plan:  Chief Complaint:  Chief Complaint  Patient presents with   Right Ankle - Routine Post Op    01/11/23 right ankle ORIF   Visit Diagnoses:  1. S/P ORIF (open reduction internal fixation) fracture   2. Pain in left ankle and joints of left foot     Plan: Patient is a 52 year old female who presents s/p right ankle ORIF on 01/22/2023.  She had bilateral ankle injury that she sustained on 01/11/2023.  Left ankle bothers her more and swelled up more at the time of the injury than her right ankle did.  She had her right ankle surgery and she states that pain is doing well following this procedure.  She has not really been taking much pain medication.  Taking aspirin for DVT prophylaxis.  No complaint of chest pain, shortness of breath, calf pain.  No fevers or chills.  She has been nonweightbearing.  Splint removed today.  On exam, patient has palpable DP pulse.  Intact ankle dorsiflexion and plantarflexion.  Incision looks to be healing well without evidence of infection or dehiscence.  No calf tenderness.  Negative Homans' sign.  Plan is to remove sutures today.  Steri-Strips applied.  She will continue nonweightbearing for 1 more week and then start weightbearing next Monday.  Then she will follow-up with [place] on 7/8 for reevaluation with new radiographs at that time to ensure that there is no displacement at the fracture site.  Want to ensure that she is set up well to go on her cruise on 7/10.  Wearing a sports ankle brace on her left ankle.  Patient will follow-up with Dr. Lajoyce Corners regarding her left ankle.  She has fluoroscopic imaging from the time of the right ankle surgery demonstrating significant laxity left ankle with incompetency of the lateral ankle ligaments which will necessitate evaluation by Dr. Lajoyce Corners.  Follow-Up  Instructions: No follow-ups on file.   Orders:  Orders Placed This Encounter  Procedures   XR Ankle Complete Right   Ambulatory referral to Orthopedic Surgery   No orders of the defined types were placed in this encounter.   Imaging: No results found.  PMFS History: Patient Active Problem List   Diagnosis Date Noted   Elevated ferritin 08/27/2019   Pain in right toe(s) 04/07/2019   Fecal incontinence 01/15/2018   Pre-diabetes 02/14/2017   Left anterior cruciate ligament tear 08/03/2016   Acute medial meniscus tear of left knee 08/03/2016   Acute lateral meniscus tear of left knee 08/03/2016   History of abnormal cervical Pap smear 09/21/2013   Leukocytopenia 03/02/2013   S/P hip replacement 02/06/2013   Hyperlipidemia 10/13/2012   HTN (hypertension) 10/13/2012   Past Medical History:  Diagnosis Date   Anemia    hx of   Anxiety 20 years ago   Depression 20 years ago   Diabetes mellitus without complication (HCC)    type 2   GERD (gastroesophageal reflux disease)    Headache    Heart murmur    in childhood, no problems as an adult   History of blood transfusion 08/21/2022   History of kidney stones    passed stones   HSV (herpes simplex virus) infection    Hyperlipidemia  Hypertension    history of htn, no meds now   Substance abuse (HCC)    last use in her 30s, marijuana cocaine and crack   Ulcer    x2   Vaginal Pap smear, abnormal     Family History  Adopted: Yes  Family history unknown: Yes    Past Surgical History:  Procedure Laterality Date   ANTERIOR CRUCIATE LIGAMENT REPAIR Left 09/04/2016   Procedure: LEFT KNEE MEDIAL COLLATERAL LIGAMENT REPAIR, ANTERIOR CRUCIATE LIGAMENT RECONSTRUCTION, MENISCAL DEBRIDEMENT VERSUS REPAIR;  Surgeon: Cammy Copa, MD;  Location: [facility] OR;  Service: Orthopedics;  Laterality: Left;   APPENDECTOMY     CHOLECYSTECTOMY N/A 08/20/2022   Procedure: LAPAROSCOPIC CHOLECYSTECTOMY;  Surgeon: Fritzi Mandes, MD;   Location: [facility] OR;  Service: General;  Laterality: N/A;   ESOPHAGOGASTRODUODENOSCOPY ENDOSCOPY     at least 4 last one 07/2022   JOINT REPLACEMENT     LAPAROSCOPIC APPENDECTOMY N/A 02/23/2015   Procedure: APPENDECTOMY LAPAROSCOPIC;  Surgeon: Gaynelle Adu, MD;  Location: [facility] ORS;  Service: General;  Laterality: N/A;   ORIF ANKLE FRACTURE Right 01/22/2023   Procedure: OPEN REDUCTION INTERNAL FIXATION (ORIF) RIGHT ANKLE FRACTURE;  Surgeon: Cammy Copa, MD;  Location: [facility] OR;  Service: Orthopedics;  Laterality: Right;   SPINAL CORD STIMULATOR IMPLANT  2020   [employer]tronic   TOTAL HIP ARTHROPLASTY Left 02/06/2013   Procedure: LEFT TOTAL HIP ARTHROPLASTY ANTERIOR APPROACH;  Surgeon: Kathryne Hitch, MD;  Location: [facility] ORS;  Service: Orthopedics;  Laterality: Left;   Social History   Occupational History   Not on file  Tobacco Use   Smoking status: Former    Packs/day: 0.25    Years: 20.00    Additional pack years: 0.00    Total pack years: 5.00    Types: Cigarettes    Quit date: 08/13/2013    Years since quitting: 9.4   Smokeless tobacco: Never  Vaping Use   Vaping Use: Never used  Substance and Sexual Activity   Alcohol use: Yes    Alcohol/week: 3.0 - 4.0 standard drinks of alcohol    Types: 3 - 4 Glasses of wine per week    Comment: few glasses of wine a week   Drug use: Yes    Types: Marijuana, Cocaine, "Crack" cocaine    Comment: last used back in her 30s   Sexual activity: Yes    Birth control/protection: Injection    Comment: Last Depo Inj 12/19/22

## 2023-02-10 DIAGNOSIS — S8261XA Displaced fracture of lateral malleolus of right fibula, initial encounter for closed fracture: Secondary | ICD-10-CM

## 2023-02-18 ENCOUNTER — Other Ambulatory Visit (INDEPENDENT_AMBULATORY_CARE_PROVIDER_SITE_OTHER): Payer: Worker's Compensation

## 2023-02-18 ENCOUNTER — Ambulatory Visit (INDEPENDENT_AMBULATORY_CARE_PROVIDER_SITE_OTHER): Payer: Worker's Compensation | Admitting: Orthopedic Surgery

## 2023-02-18 DIAGNOSIS — Z8781 Personal history of (healed) traumatic fracture: Secondary | ICD-10-CM

## 2023-02-18 DIAGNOSIS — Z9889 Other specified postprocedural states: Secondary | ICD-10-CM

## 2023-02-19 ENCOUNTER — Encounter: Payer: Self-pay | Admitting: Orthopedic Surgery

## 2023-02-19 NOTE — Progress Notes (Signed)
Post-Op Visit Note   Patient: Lisa [name]           Date of Birth: 03-14-1971           MRN: [id number] Visit Date: 02/18/2023 PCP: Pearline Cables, MD   Assessment & Plan:  Chief Complaint:  Chief Complaint  Patient presents with   Right Ankle - Routine Post Op   Visit Diagnoses:  1. S/P ORIF (open reduction internal fixation) fracture     Plan: It is now 3 weeks out right ankle lateral malleolar fracture open reduction internal fixation.  She has been partial weightbearing in the boot and crutches.  Doing well overall.  Going on a cruise in 2 days.  On examination no calf tenderness negative Homans.  Incision well-healed.  Did caution her against any submersion for the next 3 weeks.  Mortise is stable.  Ankle range of motion improving.  Plan at this time is okay for some ambulation with single crutch and tennis shoes.  Continue to work on ankle range of motion exercises.  She is going to use her boot for excursions.  3-week return for clinical recheck and likely advancement to weightbearing as tolerated without any boot or crutches which can be tapered at that time.  Follow-Up Instructions: No follow-ups on file.   Orders:  Orders Placed This Encounter  Procedures   XR Ankle Complete Right   No orders of the defined types were placed in this encounter.   Imaging: No results found.  PMFS History: Patient Active Problem List   Diagnosis Date Noted   Closed displaced fracture of lateral malleolus of right fibula 02/10/2023   Elevated ferritin 08/27/2019   Fecal incontinence 01/15/2018   Pre-diabetes 02/14/2017   Left anterior cruciate ligament tear 08/03/2016   Acute medial meniscus tear of left knee 08/03/2016   Acute lateral meniscus tear of left knee 08/03/2016   History of abnormal cervical Pap smear 09/21/2013   Leukocytopenia 03/02/2013   S/P hip replacement 02/06/2013   Hyperlipidemia 10/13/2012   HTN (hypertension) 10/13/2012   Past Medical History:   Diagnosis Date   Anemia    hx of   Anxiety 20 years ago   Depression 20 years ago   Diabetes mellitus without complication (HCC)    type 2   GERD (gastroesophageal reflux disease)    Headache    Heart murmur    in childhood, no problems as an adult   History of blood transfusion 08/21/2022   History of kidney stones    passed stones   HSV (herpes simplex virus) infection    Hyperlipidemia    Hypertension    history of htn, no meds now   Substance abuse (HCC)    last use in her 30s, marijuana cocaine and crack   Ulcer    x2   Vaginal Pap smear, abnormal     Family History  Adopted: Yes  Family history unknown: Yes    Past Surgical History:  Procedure Laterality Date   ANTERIOR CRUCIATE LIGAMENT REPAIR Left 09/04/2016   Procedure: LEFT KNEE MEDIAL COLLATERAL LIGAMENT REPAIR, ANTERIOR CRUCIATE LIGAMENT RECONSTRUCTION, MENISCAL DEBRIDEMENT VERSUS REPAIR;  Surgeon: Cammy Copa, MD;  Location: [facility] OR;  Service: Orthopedics;  Laterality: Left;   APPENDECTOMY     CHOLECYSTECTOMY N/A 08/20/2022   Procedure: LAPAROSCOPIC CHOLECYSTECTOMY;  Surgeon: Fritzi Mandes, MD;  Location: [facility] OR;  Service: General;  Laterality: N/A;   ESOPHAGOGASTRODUODENOSCOPY ENDOSCOPY     at least 4 last one  07/2022   JOINT REPLACEMENT     LAPAROSCOPIC APPENDECTOMY N/A 02/23/2015   Procedure: APPENDECTOMY LAPAROSCOPIC;  Surgeon: Gaynelle Adu, MD;  Location: [facility];  Service: General;  Laterality: N/A;   ORIF ANKLE FRACTURE Right 01/22/2023   Procedure: OPEN REDUCTION INTERNAL FIXATION (ORIF) RIGHT ANKLE FRACTURE;  Surgeon: Cammy Copa, MD;  Location: [facility] OR;  Service: Orthopedics;  Laterality: Right;   SPINAL CORD STIMULATOR IMPLANT  2020   [employer]onic   TOTAL HIP ARTHROPLASTY Left 02/06/2013   Procedure: LEFT TOTAL HIP ARTHROPLASTY ANTERIOR APPROACH;  Surgeon: Kathryne Hitch, MD;  Location: [facility];  Service: Orthopedics;  Laterality: Left;   Social History   Occupational History    Not on file  Tobacco Use   Smoking status: Former    Packs/day: 0.25    Years: 20.00    Additional pack years: 0.00    Total pack years: 5.00    Types: Cigarettes    Quit date: 08/13/2013    Years since quitting: 9.5   Smokeless tobacco: Never  Vaping Use   Vaping Use: Never used  Substance and Sexual Activity   Alcohol use: Yes    Alcohol/week: 3.0 - 4.0 standard drinks of alcohol    Types: 3 - 4 Glasses of wine per week    Comment: few glasses of wine a week   Drug use: Yes    Types: Marijuana, Cocaine, "Crack" cocaine    Comment: last used back in her 30s   Sexual activity: Yes    Birth control/protection: Injection    Comment: Last Depo Inj 12/19/22

## 2023-02-28 ENCOUNTER — Ambulatory Visit: Payer: BC Managed Care – PPO | Admitting: Orthopedic Surgery

## 2023-03-11 ENCOUNTER — Ambulatory Visit (INDEPENDENT_AMBULATORY_CARE_PROVIDER_SITE_OTHER): Payer: Worker's Compensation | Admitting: Orthopedic Surgery

## 2023-03-11 DIAGNOSIS — M25572 Pain in left ankle and joints of left foot: Secondary | ICD-10-CM

## 2023-03-11 DIAGNOSIS — Z9889 Other specified postprocedural states: Secondary | ICD-10-CM

## 2023-03-13 ENCOUNTER — Other Ambulatory Visit (INDEPENDENT_AMBULATORY_CARE_PROVIDER_SITE_OTHER): Payer: Worker's Compensation

## 2023-03-13 ENCOUNTER — Ambulatory Visit (INDEPENDENT_AMBULATORY_CARE_PROVIDER_SITE_OTHER): Payer: Worker's Compensation | Admitting: Orthopedic Surgery

## 2023-03-13 DIAGNOSIS — Z8781 Personal history of (healed) traumatic fracture: Secondary | ICD-10-CM

## 2023-03-13 DIAGNOSIS — Z9889 Other specified postprocedural states: Secondary | ICD-10-CM | POA: Diagnosis not present

## 2023-03-15 ENCOUNTER — Ambulatory Visit (INDEPENDENT_AMBULATORY_CARE_PROVIDER_SITE_OTHER): Payer: BC Managed Care – PPO

## 2023-03-15 ENCOUNTER — Encounter: Payer: Self-pay | Admitting: Orthopedic Surgery

## 2023-03-15 DIAGNOSIS — Z3042 Encounter for surveillance of injectable contraceptive: Secondary | ICD-10-CM

## 2023-03-15 MED ORDER — MEDROXYPROGESTERONE ACETATE 150 MG/ML IM SUSY
PREFILLED_SYRINGE | Freq: Once | INTRAMUSCULAR | Status: AC
Start: 2023-03-15 — End: 2023-03-15

## 2023-03-15 NOTE — Progress Notes (Signed)
Date last pap: 11/03/20. Last Depo-Provera: 12/19/22. Side Effects if any: None. Serum HCG indicated? No. Depo-Provera 150 mg IM given by: Laney Pastor. CMA. Next appointment due 10/18-11/1.

## 2023-03-15 NOTE — Progress Notes (Signed)
Post-Op Visit Note   Patient: Lisa [name]           Date of Birth: 12/02/1970           MRN: [id number] Visit Date: 03/13/2023 PCP: Pearline Cables, MD   Assessment & Plan:  Chief Complaint:  Chief Complaint  Patient presents with   Right Ankle - Routine Post Op    01/22/23 right ankle ORIF   Visit Diagnoses:  1. S/P ORIF (open reduction internal fixation) fracture     Plan: Given is a 52 year old patient who underwent open reduction internal fixation of right ankle fracture 01/22/2023.  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 injury.  Overall she is doing well.  She is walking around weightbearing as tolerated in fracture boot.  She has left ankle stabilizing surgery pending for gross instability on that left-hand side.  On examination all incisions on the right are well-healed.  No real tenderness and less than expected swelling is present.  Syndesmosis stable radiographs look good in terms of fracture healing.  Plan at this time is to DC the fracture boot and to continue weightbearing as tolerated.  She will follow-up with [place] as needed.  Follow-Up Instructions: No follow-ups on file.   Orders:  Orders Placed This Encounter  Procedures   XR Ankle Complete Right   No orders of the defined types were placed in this encounter.   Imaging: No results found.  PMFS History: Patient Active Problem List   Diagnosis Date Noted   Closed displaced fracture of lateral malleolus of right fibula 02/10/2023   Elevated ferritin 08/27/2019   Fecal incontinence 01/15/2018   Pre-diabetes 02/14/2017   Left anterior cruciate ligament tear 08/03/2016   Acute medial meniscus tear of left knee 08/03/2016   Acute lateral meniscus tear of left knee 08/03/2016   History of abnormal cervical Pap smear 09/21/2013   Leukocytopenia 03/02/2013   S/P hip replacement 02/06/2013   Hyperlipidemia 10/13/2012   HTN (hypertension) 10/13/2012   Past Medical History:  Diagnosis Date   Anemia    hx of    Anxiety 20 years ago   Depression 20 years ago   Diabetes mellitus without complication (HCC)    type 2   GERD (gastroesophageal reflux disease)    Headache    Heart murmur    in childhood, no problems as an adult   History of blood transfusion 08/21/2022   History of kidney stones    passed stones   HSV (herpes simplex virus) infection    Hyperlipidemia    Hypertension    history of htn, no meds now   Substance abuse (HCC)    last use in her 30s, marijuana cocaine and crack   Ulcer    x2   Vaginal Pap smear, abnormal     Family History  Adopted: Yes  Family history unknown: Yes    Past Surgical History:  Procedure Laterality Date   ANTERIOR CRUCIATE LIGAMENT REPAIR Left 09/04/2016   Procedure: LEFT KNEE MEDIAL COLLATERAL LIGAMENT REPAIR, ANTERIOR CRUCIATE LIGAMENT RECONSTRUCTION, MENISCAL DEBRIDEMENT VERSUS REPAIR;  Surgeon: Cammy Copa, MD;  Location: [facility] OR;  Service: Orthopedics;  Laterality: Left;   APPENDECTOMY     CHOLECYSTECTOMY N/A 08/20/2022   Procedure: LAPAROSCOPIC CHOLECYSTECTOMY;  Surgeon: Fritzi Mandes, MD;  Location: [facility] OR;  Service: General;  Laterality: N/A;   ESOPHAGOGASTRODUODENOSCOPY ENDOSCOPY     at least 4 last one 07/2022   JOINT REPLACEMENT     LAPAROSCOPIC APPENDECTOMY N/A 02/23/2015  Procedure: APPENDECTOMY LAPAROSCOPIC;  Surgeon: Gaynelle Adu, MD;  Location: [facility];  Service: General;  Laterality: N/A;   ORIF ANKLE FRACTURE Right 01/22/2023   Procedure: OPEN REDUCTION INTERNAL FIXATION (ORIF) RIGHT ANKLE FRACTURE;  Surgeon: Cammy Copa, MD;  Location: [facility] OR;  Service: Orthopedics;  Laterality: Right;   SPINAL CORD STIMULATOR IMPLANT  2020   [employer]   TOTAL HIP ARTHROPLASTY Left 02/06/2013   Procedure: LEFT TOTAL HIP ARTHROPLASTY ANTERIOR APPROACH;  Surgeon: Kathryne Hitch, MD;  Location: [facility];  Service: Orthopedics;  Laterality: Left;   Social History   Occupational History   Not on file  Tobacco Use   Smoking  status: Former    Current packs/day: 0.00    Average packs/day: 0.3 packs/day for 20.0 years (5.0 ttl pk-yrs)    Types: Cigarettes    Start date: 08/13/1993    Quit date: 08/13/2013    Years since quitting: 9.5   Smokeless tobacco: Never  Vaping Use   Vaping status: Never Used  Substance and Sexual Activity   Alcohol use: Yes    Alcohol/week: 3.0 - 4.0 standard drinks of alcohol    Types: 3 - 4 Glasses of wine per week    Comment: few glasses of wine a week   Drug use: Yes    Types: Marijuana, Cocaine, "Crack" cocaine    Comment: last used back in her 30s   Sexual activity: Yes    Birth control/protection: Injection    Comment: Last Depo Inj 12/19/22

## 2023-03-17 ENCOUNTER — Encounter: Payer: Self-pay | Admitting: Orthopedic Surgery

## 2023-03-17 NOTE — Progress Notes (Addendum)
Office Visit Note   Patient: Lisa [name]           Date of Birth: [birth year]           MRN: [id number] Visit Date: 03/11/2023              Requested by: Pearline Cables, MD [address] [place],  [place] [address] PCP: Pearline Cables, MD  Chief Complaint  Patient presents with   Left Ankle - Pain      HPI: Patient is a 52 year old woman who is status post bilateral ankle injury May 30.  Patient sustained a right ankle fracture and lateral ligament injury to the left ankle.  She underwent open reduction internal fixation of the right ankle with Dr. August Saucer on June 11.  Patient has been having lateral ankle instability.  Patient states that she has reinjured the left ankle with an acute on chronic injury where she injured her ankle going downstairs at work..  Has laxity over the lateral ankle that is not resolved with an ASO.  Assessment & Plan: Visit Diagnoses:  1. S/P ORIF (open reduction internal fixation) fracture   2. Pain in left ankle and joints of left foot     Plan: With the left ankle lateral instability discussed operative and nonoperative treatment.  Operative treatment would include reconstruction of the anterior talofibular ligament.  Risk and benefits were discussed including infection neurovascular injury pain persistent weakness.  Patient states she understands wishes to proceed at this time.  Follow-Up Instructions: No follow-ups on file.   Ortho Exam  Patient is alert, oriented, no adenopathy, well-dressed, normal affect, normal respiratory effort. Examination patient has a good pulse.  She has laxity with anterior drawer lateral tilt radiographically shows tibial talar none congruence.  Hemoglobin A1c 6.3 consistent with prediabetes.  Imaging: No results found. No images are attached to the encounter.  Labs: Lab Results  Component Value Date   HGBA1C 6.3 (H) 08/14/2022   HGBA1C 6.8 (H) 06/13/2022   HGBA1C 6.4 11/01/2021   ESRSEDRATE  14 02/11/2019   CRP 0.5 02/11/2019   LABURIC 6.4 02/11/2019     Lab Results  Component Value Date   ALBUMIN 4.5 08/14/2022   ALBUMIN 4.7 11/01/2021   ALBUMIN 4.9 11/03/2020    No results found for: "MG" Lab Results  Component Value Date   VD25OH 21.52 (L) 11/03/2020   VD25OH 5.4 (L) 04/07/2019    No results found for: "PREALBUMIN"    Latest Ref Rng & Units 01/22/2023    2:26 PM 08/14/2022    9:30 AM 06/13/2022    4:22 PM  CBC EXTENDED  WBC 4.0 - 10.5 K/uL 4.9  5.4  5.6   RBC 3.87 - 5.11 MIL/uL 4.27  4.53  4.18   Hemoglobin 12.0 - 15.0 g/dL 19.1  47.8  29.5   HCT 36.0 - 46.0 % 38.8  41.3  37.8   Platelets 150 - 400 K/uL 296  275  282.0      There is no height or weight on file to calculate BMI.  Orders:  No orders of the defined types were placed in this encounter.  No orders of the defined types were placed in this encounter.    Procedures: No procedures performed  Clinical Data: No additional findings.  ROS:  All other systems negative, except as noted in the HPI. Review of Systems  Objective: Vital Signs: There were no vitals taken for this visit.  Specialty Comments:  No specialty comments available.  PMFS History: Patient Active Problem List   Diagnosis Date Noted   Closed displaced fracture of lateral malleolus of right fibula 02/10/2023   Elevated ferritin 08/27/2019   Fecal incontinence 01/15/2018   Pre-diabetes 02/14/2017   Left anterior cruciate ligament tear 08/03/2016   Acute medial meniscus tear of left knee 08/03/2016   Acute lateral meniscus tear of left knee 08/03/2016   History of abnormal cervical Pap smear 09/21/2013   Leukocytopenia 03/02/2013   S/P hip replacement 02/06/2013   Hyperlipidemia 10/13/2012   HTN (hypertension) 10/13/2012   Past Medical History:  Diagnosis Date   Anemia    hx of   Anxiety 20 years ago   Depression 20 years ago   Diabetes mellitus without complication (HCC)    type 2   GERD  (gastroesophageal reflux disease)    Headache    Heart murmur    in childhood, no problems as an adult   History of blood transfusion 08/21/2022   History of kidney stones    passed stones   HSV (herpes simplex virus) infection    Hyperlipidemia    Hypertension    history of htn, no meds now   Substance abuse (HCC)    last use in her 30s, marijuana cocaine and crack   Ulcer    x2   Vaginal Pap smear, abnormal     Family History  Adopted: Yes  Family history unknown: Yes    Past Surgical History:  Procedure Laterality Date   ANTERIOR CRUCIATE LIGAMENT REPAIR Left 09/04/2016   Procedure: LEFT KNEE MEDIAL COLLATERAL LIGAMENT REPAIR, ANTERIOR CRUCIATE LIGAMENT RECONSTRUCTION, MENISCAL DEBRIDEMENT VERSUS REPAIR;  Surgeon: Cammy Copa, MD;  Location: [facility] OR;  Service: Orthopedics;  Laterality: Left;   APPENDECTOMY     CHOLECYSTECTOMY N/A 08/20/2022   Procedure: LAPAROSCOPIC CHOLECYSTECTOMY;  Surgeon: Fritzi Mandes, MD;  Location: [facility] OR;  Service: General;  Laterality: N/A;   ESOPHAGOGASTRODUODENOSCOPY ENDOSCOPY     at least 4 last one 07/2022   JOINT REPLACEMENT     LAPAROSCOPIC APPENDECTOMY N/A 02/23/2015   Procedure: APPENDECTOMY LAPAROSCOPIC;  Surgeon: Gaynelle Adu, MD;  Location: [facility];  Service: General;  Laterality: N/A;   ORIF ANKLE FRACTURE Right 01/22/2023   Procedure: OPEN REDUCTION INTERNAL FIXATION (ORIF) RIGHT ANKLE FRACTURE;  Surgeon: Cammy Copa, MD;  Location: [facility] OR;  Service: Orthopedics;  Laterality: Right;   SPINAL CORD STIMULATOR IMPLANT  2020   [employer]   TOTAL HIP ARTHROPLASTY Left 02/06/2013   Procedure: LEFT TOTAL HIP ARTHROPLASTY ANTERIOR APPROACH;  Surgeon: Kathryne Hitch, MD;  Location: [facility];  Service: Orthopedics;  Laterality: Left;   Social History   Occupational History   Not on file  Tobacco Use   Smoking status: Former    Current packs/day: 0.00    Average packs/day: 0.3 packs/day for 20.0 years (5.0 ttl pk-yrs)     Types: Cigarettes    Start date: 08/13/1993    Quit date: 08/13/2013    Years since quitting: 9.5   Smokeless tobacco: Never  Vaping Use   Vaping status: Never Used  Substance and Sexual Activity   Alcohol use: Yes    Alcohol/week: 3.0 - 4.0 standard drinks of alcohol    Types: 3 - 4 Glasses of wine per week    Comment: few glasses of wine a week   Drug use: Yes    Types: Marijuana, Cocaine, "Crack" cocaine    Comment: last used back  in her 30s   Sexual activity: Yes    Birth control/protection: Injection    Comment: Last Depo Inj 12/19/22

## 2023-03-19 ENCOUNTER — Encounter: Payer: Self-pay | Admitting: Family Medicine

## 2023-03-19 MED ORDER — TRAZODONE HCL 50 MG PO TABS: ORAL_TABLET | ORAL | 1 refills | Status: DC

## 2023-03-19 NOTE — Telephone Encounter (Signed)
Pt past due for OV (aware of recent procedures) okay for refill?

## 2023-03-22 ENCOUNTER — Encounter: Payer: Self-pay | Admitting: Orthopedic Surgery

## 2023-03-28 ENCOUNTER — Other Ambulatory Visit: Payer: Self-pay

## 2023-03-28 ENCOUNTER — Encounter: Payer: Self-pay | Admitting: Family Medicine

## 2023-04-01 ENCOUNTER — Other Ambulatory Visit: Payer: Self-pay

## 2023-04-01 ENCOUNTER — Encounter (HOSPITAL_COMMUNITY): Payer: Self-pay | Admitting: Orthopedic Surgery

## 2023-04-01 NOTE — Pre-Procedure Instructions (Signed)
PCP - Dr. [name] Cardiologist - Denies GI: Dr. Charlott Rakes  PPM/ICD - Denies  Chest x-ray - N/A EKG - 06/13/22 Stress Test - Denies ECHO - Denies Cardiac Cath - Denies  Sleep Study - Denies  Patient is diabetic, but does not check blood sugars.  Blood Thinner Instructions: N/A Aspirin Instructions: N/A  ERAS Protcol - Yes PRE-SURGERY Ensure or G2- N/A  COVID TEST- N/A   Anesthesia review: yes, hx of heart murmur as a child  Patient denies shortness of breath, fever, cough and chest pain at PAT appointment   All instructions explained to the patient, with a verbal understanding of the material. Patient agrees to go over the instructions while at home for a better understanding. Patient also instructed to self quarantine after being tested for COVID-19. The opportunity to ask questions was provided.   Surgical Instructions    Your procedure is scheduled on Wednesday, April 03, 2023 at 8:30 AM.  Report to [facility] Main Entrance "A" at 6:00 A.M., then check in with the Admitting office.  Call this number if you have problems the morning of surgery:  [phone]   If you have any questions prior to your surgery date call [phone]: Open Monday-Friday 8am-4pm If you experience any cold or flu symptoms such as cough, fever, chills, shortness of breath, etc. between now and your scheduled surgery, please notify us at the above number     Remember:  Do not eat after midnight the night before your surgery  You may drink clear liquids until 5:30 AM the morning of your surgery.   Clear liquids allowed are: Water, Non-Citrus Juices (without pulp), Carbonated Beverages, Clear Tea, Black Coffee ONLY (NO MILK, CREAM OR POWDERED CREAMER of any kind), and Gatorade    Take these medicines the morning of surgery with A SIP OF WATER:   pantoprazole (PROTONIX)   IF NEEDED: acetaminophen (TYLENOL)  SUMAtriptan (IMITREX)  valACYclovir (VALTREX)   As of today,  STOP taking any Aspirin (unless otherwise instructed by your surgeon) Aleve, Naproxen, Ibuprofen, Motrin, Advil, Goody's, BC's, all herbal medications, fish oil, and all vitamins.  WHAT DO I DO ABOUT MY DIABETES MEDICATION?   Do not take metFORMIN (GLUCOPHAGE) the morning of surgery.   HOW TO MANAGE YOUR DIABETES BEFORE AND AFTER SURGERY  Why is it important to control my blood sugar before and after surgery? Improving blood sugar levels before and after surgery helps healing and can limit problems. A way of improving blood sugar control is eating a healthy diet by:  Eating less sugar and carbohydrates  Increasing activity/exercise  Talking with your doctor about reaching your blood sugar goals High blood sugars (greater than 180 mg/dL) can raise your risk of infections and slow your recovery, so you will need to focus on controlling your diabetes during the weeks before surgery. Make sure that the doctor who takes care of your diabetes knows about your planned surgery including the date and location.  How do I manage my blood sugar before surgery? Check your blood sugar at least 4 times a day, starting 2 days before surgery, to make sure that the level is not too high or low.  Check your blood sugar the morning of your surgery when you wake up and every 2 hours until you get to the Short Stay unit.  If your blood sugar is less than 70 mg/dL, you will need to treat for low blood sugar: Do not take insulin. Treat a low blood sugar (  less than 70 mg/dL) with  cup of clear juice (cranberry or apple), 4 glucose tablets, OR glucose gel. Recheck blood sugar in 15 minutes after treatment (to make sure it is greater than 70 mg/dL). If your blood sugar is not greater than 70 mg/dL on recheck, call [phone] for further instructions. Report your blood sugar to the short stay nurse when you get to Short Stay.  If you are admitted to the hospital after surgery: Your blood sugar will be checked  by the staff and you will probably be given insulin after surgery (instead of oral diabetes medicines) to make sure you have good blood sugar levels. The goal for blood sugar control after surgery is 80-180 mg/dL.          Do not wear jewelry or makeup. Do not wear lotions, powders, perfumes/cologne or deodorant. Do not shave 48 hours prior to surgery. Do not bring valuables to the hospital. Do not wear nail polish, gel polish, artificial nails, or any other type of covering on natural nails (fingers and toes) If you have artificial nails or gel coating that need to be removed by a nail salon, please have this removed prior to surgery. Artificial nails or gel coating may interfere with anesthesia's ability to adequately monitor your vital signs.  [facility] is not responsible for any belongings or valuables.    Do NOT Smoke (Tobacco/Vaping)  24 hours prior to your procedure  If you use a CPAP at night, you may bring your mask for your overnight stay.   Contacts, glasses, hearing aids, dentures or partials may not be worn into surgery, please bring cases for these belongings   For patients admitted to the hospital, discharge time will be determined by your treatment team.   Patients discharged the day of surgery will not be allowed to drive home, and someone needs to stay with them for 24 hours.   SURGICAL WAITING ROOM VISITATION Patients having surgery or a procedure may have no more than 2 support people in the waiting area - these visitors may rotate.   Children under the age of 2 must have an adult with them who is not the patient. If the patient needs to stay at the hospital during part of their recovery, the visitor guidelines for inpatient rooms apply. Pre-op nurse will coordinate an appropriate time for 1 support person to accompany patient in pre-op.  This support person may not rotate.   Please refer to [email or web address] for  the visitor guidelines for Inpatients (after your surgery is over and you are in a regular room).    Special instructions:    Oral Hygiene is also important to reduce your risk of infection.  Remember - BRUSH YOUR TEETH THE MORNING OF SURGERY WITH YOUR REGULAR TOOTHPASTE   - Preparing For Surgery  Please follow these instructions carefully.     Shower the NIGHT BEFORE SURGERY and the MORNING OF SURGERY with Mild Soap.   Wear CLEAN PAJAMAS to bed the night before surgery  Place CLEAN SHEETS on your bed the night before your surgery  DO NOT SLEEP WITH PETS.   Day of Surgery: Wear Clean/Comfortable clothing the morning of surgery Do not apply any deodorants/lotions.   Remember to brush your teeth WITH YOUR REGULAR TOOTHPASTE.    If you received a COVID test during your pre-op visit, it is requested that you wear a mask when out in public, stay away from anyone that may not be feeling well, and notify  your surgeon if you develop symptoms. If you have been in contact with anyone that has tested positive in the last 10 days, please notify your surgeon.    Please read over the following fact sheets that you were given.

## 2023-04-03 ENCOUNTER — Ambulatory Visit (HOSPITAL_COMMUNITY): Payer: Worker's Compensation | Admitting: Physician Assistant

## 2023-04-03 ENCOUNTER — Other Ambulatory Visit: Payer: Self-pay

## 2023-04-03 ENCOUNTER — Ambulatory Visit (HOSPITAL_BASED_OUTPATIENT_CLINIC_OR_DEPARTMENT_OTHER): Payer: Worker's Compensation | Admitting: Physician Assistant

## 2023-04-03 ENCOUNTER — Encounter (HOSPITAL_COMMUNITY): Payer: Self-pay | Admitting: Orthopedic Surgery

## 2023-04-03 ENCOUNTER — Ambulatory Visit (HOSPITAL_COMMUNITY)
Admission: RE | Admit: 2023-04-03 | Discharge: 2023-04-03 | Disposition: A | Discharge status: Home / Self Care | Payer: Worker's Compensation | Attending: Orthopedic Surgery | Admitting: Orthopedic Surgery

## 2023-04-03 ENCOUNTER — Encounter (HOSPITAL_COMMUNITY)
Admission: RE | Disposition: A | Discharge status: Home / Self Care | Payer: Self-pay | Source: Home / Self Care | Attending: Orthopedic Surgery

## 2023-04-03 DIAGNOSIS — E119 Type 2 diabetes mellitus without complications: Secondary | ICD-10-CM | POA: Diagnosis not present

## 2023-04-03 DIAGNOSIS — Z87891 Personal history of nicotine dependence: Secondary | ICD-10-CM | POA: Diagnosis not present

## 2023-04-03 DIAGNOSIS — E785 Hyperlipidemia, unspecified: Secondary | ICD-10-CM | POA: Insufficient documentation

## 2023-04-03 DIAGNOSIS — Z7984 Long term (current) use of oral hypoglycemic drugs: Secondary | ICD-10-CM | POA: Insufficient documentation

## 2023-04-03 DIAGNOSIS — Z793 Long term (current) use of hormonal contraceptives: Secondary | ICD-10-CM | POA: Diagnosis not present

## 2023-04-03 DIAGNOSIS — K219 Gastro-esophageal reflux disease without esophagitis: Secondary | ICD-10-CM | POA: Insufficient documentation

## 2023-04-03 DIAGNOSIS — M25372 Other instability, left ankle: Secondary | ICD-10-CM | POA: Diagnosis not present

## 2023-04-03 DIAGNOSIS — F1290 Cannabis use, unspecified, uncomplicated: Secondary | ICD-10-CM | POA: Diagnosis not present

## 2023-04-03 DIAGNOSIS — S93492A Sprain of other ligament of left ankle, initial encounter: Secondary | ICD-10-CM | POA: Diagnosis not present

## 2023-04-03 DIAGNOSIS — I10 Essential (primary) hypertension: Secondary | ICD-10-CM | POA: Insufficient documentation

## 2023-04-03 HISTORY — PX: ANKLE RECONSTRUCTION: SHX1151

## 2023-04-03 LAB — BASIC METABOLIC PANEL
Anion gap: 11 (ref 5–15)
BUN: 9 mg/dL (ref 6–20)
CO2: 21 mmol/L — ABNORMAL LOW (ref 22–32)
Calcium: 9.1 mg/dL (ref 8.9–10.3)
Chloride: 103 mmol/L (ref 98–111)
Creatinine, Ser: 0.53 mg/dL (ref 0.44–1.00)
GFR, Estimated: >60 mL/min (ref >60)
Glucose, Bld: 151 mg/dL — ABNORMAL HIGH (ref 70–99)
Potassium: 4.8 mmol/L (ref 3.5–5.1)
Sodium: 135 mmol/L (ref 135–145)

## 2023-04-03 LAB — CBC
HCT: 38.6 % (ref 36.0–46.0)
Hemoglobin: 12.2 g/dL (ref 12.0–15.0)
MCH: 28.4 pg (ref 26.0–34.0)
MCHC: 31.6 g/dL (ref 30.0–36.0)
MCV: 90 fL (ref 80.0–100.0)
Platelets: 260 10*3/uL (ref 150–400)
RBC: 4.29 MIL/uL (ref 3.87–5.11)
RDW: 12.9 % (ref 11.5–15.5)
WBC: 4.8 10*3/uL (ref 4.0–10.5)
nRBC: 0 % (ref 0.0–0.2)

## 2023-04-03 LAB — GLUCOSE, CAPILLARY
Glucose-Capillary: 153 mg/dL — ABNORMAL HIGH (ref 70–99)
Glucose-Capillary: 135 mg/dL — ABNORMAL HIGH (ref 70–99)

## 2023-04-03 LAB — SURGICAL PCR SCREEN
MRSA, PCR: NEGATIVE
Staphylococcus aureus: NEGATIVE

## 2023-04-03 LAB — POCT PREGNANCY, URINE: Preg Test, Ur: NEGATIVE

## 2023-04-03 SURGERY — RECONSTRUCTION, ANKLE
Anesthesia: Monitor Anesthesia Care | Site: Ankle | Laterality: Left

## 2023-04-03 MED ORDER — PROPOFOL 500 MG/50ML IV EMUL
INTRAVENOUS | Status: DC | PRN
  Administered 2023-04-03: 30 ug/kg/min via INTRAVENOUS

## 2023-04-03 MED ORDER — FENTANYL CITRATE (PF) 250 MCG/5ML IJ SOLN
INTRAMUSCULAR | Status: DC | PRN
  Administered 2023-04-03: 100 ug via INTRAVENOUS

## 2023-04-03 MED ORDER — ACETAMINOPHEN 500 MG PO TABS: 1000.0000 mg | ORAL_TABLET | Freq: Once | ORAL | Status: DC | PRN

## 2023-04-03 MED ORDER — CEFAZOLIN SODIUM-DEXTROSE 2-4 GM/100ML-% IV SOLN
2.0000 g | INTRAVENOUS | Status: AC
  Administered 2023-04-03: 2 g via INTRAVENOUS
  Filled 2023-04-03: qty 100

## 2023-04-03 MED ORDER — ONDANSETRON HCL 4 MG/2ML IJ SOLN
INTRAMUSCULAR | Status: DC | PRN
  Administered 2023-04-03: 4 mg via INTRAVENOUS

## 2023-04-03 MED ORDER — INSULIN ASPART 100 UNIT/ML IJ SOLN: 0.0000 [IU] | INTRAMUSCULAR | Status: DC | PRN

## 2023-04-03 MED ORDER — ORAL CARE MOUTH RINSE: 15.0000 mL | Freq: Once | OROMUCOSAL | Status: AC

## 2023-04-03 MED ORDER — ONDANSETRON HCL 4 MG/2ML IJ SOLN
INTRAMUSCULAR | Status: AC
  Filled 2023-04-03: qty 2

## 2023-04-03 MED ORDER — DEXAMETHASONE SODIUM PHOSPHATE 10 MG/ML IJ SOLN
INTRAMUSCULAR | Status: AC
  Filled 2023-04-03: qty 1

## 2023-04-03 MED ORDER — DEXAMETHASONE SODIUM PHOSPHATE 10 MG/ML IJ SOLN
INTRAMUSCULAR | Status: DC | PRN
  Administered 2023-04-03: 4 mg via INTRAVENOUS

## 2023-04-03 MED ORDER — LIDOCAINE 2% (20 MG/ML) 5 ML SYRINGE
INTRAMUSCULAR | Status: AC
  Filled 2023-04-03: qty 5

## 2023-04-03 MED ORDER — ACETAMINOPHEN 10 MG/ML IV SOLN: 1000.0000 mg | Freq: Once | INTRAVENOUS | Status: DC | PRN

## 2023-04-03 MED ORDER — GLYCOPYRROLATE 0.2 MG/ML IJ SOLN
INTRAMUSCULAR | Status: DC | PRN
  Administered 2023-04-03: .2 mg via INTRAVENOUS

## 2023-04-03 MED ORDER — OXYCODONE HCL 5 MG PO TABS: 5.0000 mg | ORAL_TABLET | Freq: Once | ORAL | Status: DC | PRN

## 2023-04-03 MED ORDER — PROPOFOL 10 MG/ML IV BOLUS
INTRAVENOUS | Status: DC | PRN
  Administered 2023-04-03: 30 mg via INTRAVENOUS
  Administered 2023-04-03: 20 mg via INTRAVENOUS
  Administered 2023-04-03: 30 mg via INTRAVENOUS

## 2023-04-03 MED ORDER — FENTANYL CITRATE (PF) 100 MCG/2ML IJ SOLN: 25.0000 ug | INTRAMUSCULAR | Status: DC | PRN

## 2023-04-03 MED ORDER — OXYCODONE-ACETAMINOPHEN 5-325 MG PO TABS
1.0000 | ORAL_TABLET | ORAL | 0 refills | Status: DC | PRN
Start: 2023-04-03 — End: 2023-06-25

## 2023-04-03 MED ORDER — ACETAMINOPHEN 160 MG/5ML PO SOLN: 1000.0000 mg | Freq: Once | ORAL | Status: DC | PRN

## 2023-04-03 MED ORDER — FENTANYL CITRATE (PF) 250 MCG/5ML IJ SOLN
INTRAMUSCULAR | Status: AC
  Filled 2023-04-03: qty 5

## 2023-04-03 MED ORDER — CHLORHEXIDINE GLUCONATE 0.12 % MT SOLN
15.0000 mL | Freq: Once | OROMUCOSAL | Status: AC
  Administered 2023-04-03: 15 mL via OROMUCOSAL
  Filled 2023-04-03: qty 15

## 2023-04-03 MED ORDER — OXYCODONE HCL 5 MG/5ML PO SOLN: 5.0000 mg | Freq: Once | ORAL | Status: DC | PRN

## 2023-04-03 MED ORDER — LIDOCAINE 2% (20 MG/ML) 5 ML SYRINGE
INTRAMUSCULAR | Status: DC | PRN
  Administered 2023-04-03: 50 mg via INTRAVENOUS
  Administered 2023-04-03: 20 mg via INTRAVENOUS

## 2023-04-03 MED ORDER — 0.9 % SODIUM CHLORIDE (POUR BTL) OPTIME
TOPICAL | Status: DC | PRN
  Administered 2023-04-03: 1000 mL

## 2023-04-03 MED ORDER — LACTATED RINGERS IV SOLN: INTRAVENOUS | Status: DC

## 2023-04-03 MED ORDER — PROPOFOL 10 MG/ML IV BOLUS
INTRAVENOUS | Status: AC
  Filled 2023-04-03: qty 20

## 2023-04-03 SURGICAL SUPPLY — 45 items
BAG COUNTER SPONGE SURGICOUNT (BAG) ×2 IMPLANT
BAG SPNG CNTER NS LX DISP (BAG) ×1
BLADE SURG 10 STRL SS (BLADE) ×2 IMPLANT
BNDG CMPR 5X4 KNIT ELC UNQ LF (GAUZE/BANDAGES/DRESSINGS) ×1
BNDG CMPR 5X6 CHSV STRCH STRL (GAUZE/BANDAGES/DRESSINGS)
BNDG CMPR 9X4 STRL LF SNTH (GAUZE/BANDAGES/DRESSINGS)
BNDG COHESIVE 6X5 TAN ST LF (GAUZE/BANDAGES/DRESSINGS) ×4 IMPLANT
BNDG ELASTIC 4INX 5YD STR LF (GAUZE/BANDAGES/DRESSINGS) IMPLANT
BNDG ESMARK 4X9 LF (GAUZE/BANDAGES/DRESSINGS) IMPLANT
BNDG GAUZE DERMACEA FLUFF 4 (GAUZE/BANDAGES/DRESSINGS) ×2 IMPLANT
BNDG GZE DERMACEA 4 6PLY (GAUZE/BANDAGES/DRESSINGS) ×1
COTTON STERILE ROLL (GAUZE/BANDAGES/DRESSINGS) ×2 IMPLANT
COVER SURGICAL LIGHT HANDLE (MISCELLANEOUS) ×2 IMPLANT
CUFF TOURN SGL QUICK 34 (TOURNIQUET CUFF)
CUFF TOURN SGL QUICK 42 (TOURNIQUET CUFF) IMPLANT
CUFF TRNQT CYL 34X4.125X (TOURNIQUET CUFF) IMPLANT
DRAPE INCISE IOBAN 66X45 STRL (DRAPES) ×2 IMPLANT
DRAPE U-SHAPE 47X51 STRL (DRAPES) ×2 IMPLANT
DRSG ADAPTIC 3X8 NADH LF (GAUZE/BANDAGES/DRESSINGS) ×2 IMPLANT
DURAPREP 26ML APPLICATOR (WOUND CARE) ×2 IMPLANT
ELECT REM PT RETURN 9FT ADLT (ELECTROSURGICAL) ×1
ELECTRODE REM PT RTRN 9FT ADLT (ELECTROSURGICAL) ×2 IMPLANT
GAUZE PAD ABD 8X10 STRL (GAUZE/BANDAGES/DRESSINGS) IMPLANT
GAUZE SPONGE 4X4 12PLY STRL (GAUZE/BANDAGES/DRESSINGS) ×2 IMPLANT
GLOVE BIOGEL PI IND STRL 9 (GLOVE) ×2 IMPLANT
GLOVE SURG ORTHO 9.0 STRL STRW (GLOVE) ×2 IMPLANT
GOWN STRL REUS W/ TWL XL LVL3 (GOWN DISPOSABLE) ×6 IMPLANT
GOWN STRL REUS W/TWL XL LVL3 (GOWN DISPOSABLE) ×3
KIT TURNOVER KIT B (KITS) ×2 IMPLANT
MANIFOLD NEPTUNE II (INSTRUMENTS) ×2 IMPLANT
NDL SUT .5 MAYO 1.404X.05X (NEEDLE) ×2 IMPLANT
NEEDLE MAYO TAPER (NEEDLE)
NS IRRIG 1000ML POUR BTL (IV SOLUTION) ×2 IMPLANT
PACK ORTHO EXTREMITY (CUSTOM PROCEDURE TRAY) ×2 IMPLANT
PAD ARMBOARD 7.5X6 YLW CONV (MISCELLANEOUS) ×4 IMPLANT
SPONGE T-LAP 4X18 ~~LOC~~+RFID (SPONGE) ×4 IMPLANT
SUT ETHILON 3 0 FSLX (SUTURE) ×2 IMPLANT
SUT FIBERWIRE #2 38 T-5 BLUE (SUTURE) ×2
SUT MNCRL AB 3-0 PS2 18 (SUTURE) ×2 IMPLANT
SUTURE FIBERWR #2 38 T-5 BLUE (SUTURE) ×8 IMPLANT
TOWEL GREEN STERILE (TOWEL DISPOSABLE) ×2 IMPLANT
TOWEL GREEN STERILE FF (TOWEL DISPOSABLE) ×2 IMPLANT
TUBE CONNECTING 12X1/4 (SUCTIONS) ×2 IMPLANT
WATER STERILE IRR 1000ML POUR (IV SOLUTION) ×2 IMPLANT
YANKAUER SUCT BULB TIP NO VENT (SUCTIONS) ×2 IMPLANT

## 2023-04-03 NOTE — H&P (Signed)
Lisa [name] is an 52 y.o. female.   Chief Complaint: Lateral left ankle pain and instability. HPI: Patient is a 52 year old woman who is status post bilateral ankle injury May 30.  Patient sustained a right ankle fracture and lateral ligament injury to the left ankle.  She underwent open reduction internal fixation of the right ankle with Dr. August Saucer on June 11.  Patient has been having lateral ankle instability.  Patient states that she has reinjured the left ankle with an acute on chronic injury where she injured her ankle going downstairs at work..  Has laxity over the lateral ankle that is not resolved with an ASO.   Past Medical History:  Diagnosis Date   Anemia    hx of   Anxiety 20 years ago   Depression 20 years ago   Diabetes mellitus without complication (HCC)    type 2   GERD (gastroesophageal reflux disease)    Headache    Heart murmur    in childhood, no problems as an adult   History of blood transfusion 08/21/2022   History of kidney stones    passed stones   HSV (herpes simplex virus) infection    Hyperlipidemia    Hypertension    history of htn, no meds now   Substance abuse (HCC)    last use in her 30s, marijuana cocaine and crack   Ulcer    x2   Vaginal Pap smear, abnormal     Past Surgical History:  Procedure Laterality Date   ANTERIOR CRUCIATE LIGAMENT REPAIR Left 09/04/2016   Procedure: LEFT KNEE MEDIAL COLLATERAL LIGAMENT REPAIR, ANTERIOR CRUCIATE LIGAMENT RECONSTRUCTION, MENISCAL DEBRIDEMENT VERSUS REPAIR;  Surgeon: Cammy Copa, MD;  Location: [facility] OR;  Service: Orthopedics;  Laterality: Left;   APPENDECTOMY     CHOLECYSTECTOMY N/A 08/20/2022   Procedure: LAPAROSCOPIC CHOLECYSTECTOMY;  Surgeon: Fritzi Mandes, MD;  Location: [facility] OR;  Service: General;  Laterality: N/A;   ESOPHAGOGASTRODUODENOSCOPY ENDOSCOPY     at least 4 last one 07/2022   JOINT REPLACEMENT     LAPAROSCOPIC APPENDECTOMY N/A 02/23/2015   Procedure: APPENDECTOMY LAPAROSCOPIC;   Surgeon: Gaynelle Adu, MD;  Location: [facility] ORS;  Service: General;  Laterality: N/A;   ORIF ANKLE FRACTURE Right 01/22/2023   Procedure: OPEN REDUCTION INTERNAL FIXATION (ORIF) RIGHT ANKLE FRACTURE;  Surgeon: Cammy Copa, MD;  Location: [facility] OR;  Service: Orthopedics;  Laterality: Right;   SPINAL CORD STIMULATOR IMPLANT  2020   [employer]   TOTAL HIP ARTHROPLASTY Left 02/06/2013   Procedure: LEFT TOTAL HIP ARTHROPLASTY ANTERIOR APPROACH;  Surgeon: Kathryne Hitch, MD;  Location: [facility] ORS;  Service: Orthopedics;  Laterality: Left;    Family History  Adopted: Yes  Family history unknown: Yes   Social History:  reports that she quit smoking about 9 years ago. Her smoking use included cigarettes. She started smoking about 29 years ago. She has a 5 pack-year smoking history. She has never used smokeless tobacco. She reports current alcohol use of about 3.0 - 4.0 standard drinks of alcohol per week. She reports current drug use. Drugs: Marijuana, Cocaine, and "Crack" cocaine.  Allergies:  Allergies  Allergen Reactions   Nsaids Other (See Comments)    ulcers   Nitroglycerin     Pt took Nitroglycerin patches - pt stated, "Made my skin bright red when I changed my patch; it made my foot itch and burn"    Facility-Administered Medications Prior to Admission  Medication Dose Route Frequency Provider Last Rate Last  Admin   medroxyPROGESTERone (DEPO-PROVERA) injection 150 mg  150 mg Intramuscular Q90 days Copland, Gwenlyn Found, MD   150 mg at 12/19/22 1551   Medications Prior to Admission  Medication Sig Dispense Refill   acetaminophen (TYLENOL) 500 MG tablet Take 1 tablet (500 mg total) by mouth every 6 (six) hours as needed for mild pain. (Patient taking differently: Take 1,000 mg by mouth every 6 (six) hours as needed for mild pain.) 30 tablet 0   Cholecalciferol (VITAMIN D-3 PO) Take 1 tablet by mouth daily.     Cyanocobalamin (B-12 PO) Take 1 tablet by mouth 2 (two) times a week.      doxylamine, Sleep, (UNISOM) 25 MG tablet Take 25-50 mg by mouth at bedtime as needed for sleep.     latanoprost (XALATAN) 0.005 % ophthalmic solution Place 1 drop into both eyes at bedtime.     lisinopril (ZESTRIL) 10 MG tablet TAKE 1 TABLET BY MOUTH EVERY DAY 30 tablet 8   MAGNESIUM PO Take 1 tablet by mouth daily.     medroxyPROGESTERone (DEPO-PROVERA) 150 MG/ML injection Inject 1 mL (150 mg total) into the muscle once. (Patient taking differently: Inject 150 mg into the muscle every 3 (three) months. Last injection 12/19/22) 1 mL 0   metFORMIN (GLUCOPHAGE) 500 MG tablet Take 1 tablet (500 mg total) by mouth daily. 90 tablet 3   pantoprazole (PROTONIX) 40 MG tablet Take 1 tablet (40 mg total) by mouth daily. 30 tablet 8   polycarbophil (FIBERCON) 625 MG tablet Take 1,250 mg by mouth 2 (two) times daily.     POTASSIUM PO Take 1 tablet by mouth daily.     simvastatin (ZOCOR) 20 MG tablet TAKE 1 TABLET BY MOUTH EVERY DAY IN THE EVENING 90 tablet 3   SUMAtriptan (IMITREX) 50 MG tablet TAKE 1 TABLET BY MOUTH EVERY 2 (TWO) HOURS AS NEEDED FOR MIGRAINE. MAX 100 MG IN 24 HOURS 9 tablet 4   traZODone (DESYREL) 50 MG tablet TAKE 1/2 TO 1 TABLET BY MOUTH AT BEDTIME AS NEEDED FOR SLEEP *MAY TAKE 2 AS NEEDED * (Patient taking differently: Take 100 mg by mouth at bedtime.) 180 tablet 1   valACYclovir (VALTREX) 1000 MG tablet TAKE 1 TABLET BY MOUTH EVERY DAY (Patient taking differently: Take 1,000 mg by mouth daily as needed (break outs).) 30 tablet 5    No results found for this or any previous visit (from the past 48 hour(s)). No results found.  Review of Systems  All other systems reviewed and are negative.   Height 5\' 8"  (1.727 m), weight 81.6 kg. Physical Exam  Patient is alert, oriented, no adenopathy, well-dressed, normal affect, normal respiratory effort. Examination patient has a good pulse.  She has laxity with anterior drawer lateral tilt radiographically shows tibial talar none congruence.   Hemoglobin A1c 6.3 consistent with prediabetes. Assessment/Plan 1. S/P ORIF (open reduction internal fixation) fracture   2. Pain in left ankle and joints of left foot       Plan: With the left ankle lateral instability discussed operative and nonoperative treatment.  Operative treatment would include reconstruction of the anterior talofibular ligament.  Risk and benefits were discussed including infection neurovascular injury pain persistent weakness.  Patient states she understands wishes to proceed at this time.  Nadara Mustard, MD 04/03/2023, 6:34 AM

## 2023-04-03 NOTE — Progress Notes (Signed)
Orthopedic Tech Progress Note Patient Details:  Lisa [name] [birth year] [id number]  Ortho Devices Type of Ortho Device: CAM walker Ortho Device/Splint Location: LLE Ortho Device/Splint Interventions: Application   Post Interventions Patient Tolerated: Well  [name] 04/03/2023, 9:48 AM

## 2023-04-03 NOTE — Anesthesia Preprocedure Evaluation (Signed)
Anesthesia Evaluation  Patient identified by MRN, date of birth, ID band Patient awake    Reviewed: Allergy & Precautions, NPO status , Patient's Chart, lab work & pertinent test results  History of Anesthesia Complications Negative for: history of anesthetic complications  Airway Mallampati: II  TM Distance: >3 FB Neck ROM: Full    Dental  (+) Teeth Intact, Dental Advisory Given   Pulmonary neg shortness of breath, neg sleep apnea, neg COPD, neg recent URI, former smoker   breath sounds clear to auscultation       Cardiovascular hypertension, Pt. on medications (-) angina (-) Past MI and (-) CHF  Rhythm:Regular     Neuro/Psych  Headaches PSYCHIATRIC DISORDERS Anxiety Depression       GI/Hepatic Neg liver ROS,GERD  Medicated and Controlled,,  Endo/Other  diabetes  Lab Results      Component                Value               Date                      HGBA1C                   6.3 (H)             08/14/2022             Renal/GU Lab Results      Component                Value               Date                      NA                       135                 04/03/2023                K                        4.8                 04/03/2023                CO2                      21 (L)              04/03/2023                GLUCOSE                  151 (H)             04/03/2023                BUN                      9                   04/03/2023                CREATININE               0.53  04/03/2023                CALCIUM                  9.1                 04/03/2023                GFR                      109.82              06/13/2022                GFRNONAA                 >60                 04/03/2023                Musculoskeletal   Abdominal   Peds  Hematology negative hematology ROS (+) Lab Results      Component                Value               Date                      WBC                       4.8                 04/03/2023                HGB                      12.2                04/03/2023                HCT                      38.6                04/03/2023                MCV                      90.0                04/03/2023                PLT                      260                 04/03/2023              Anesthesia Other Findings   Reproductive/Obstetrics                              Anesthesia Physical Anesthesia Plan  ASA: 2  Anesthesia Plan: Regional and MAC   Post-op Pain Management: Regional block*   Induction: Intravenous  PONV Risk Score and Plan: 2 and Ondansetron and Propofol infusion  Airway Management Planned: Nasal Cannula, Natural Airway and Simple Face Mask  Additional Equipment: None  Intra-op Plan:   Post-operative Plan:   Informed Consent: I have reviewed the patients History and Physical, chart, labs and discussed the procedure including the risks, benefits and alternatives for the proposed anesthesia with the patient or authorized representative who has indicated his/her understanding and acceptance.     Dental advisory given  Plan Discussed with: CRNA  Anesthesia Plan Comments:          Anesthesia Quick Evaluation

## 2023-04-03 NOTE — Op Note (Signed)
04/03/2023  9:10 AM  PATIENT:  Lisa [name]    PRE-OPERATIVE DIAGNOSIS:  Lateral Instability Left Ankle  POST-OPERATIVE DIAGNOSIS:  Same  PROCEDURE:  LEFT LATERAL ANKLE LIGAMENT RECONSTRUCTION, Brostrm reconstruction  SURGEON:  Nadara Mustard, MD  PHYSICIAN ASSISTANT:None ANESTHESIA:   General  PREOPERATIVE INDICATIONS:  Lisa [name] is a  52 y.o. female with a diagnosis of Lateral Instability Left Ankle who failed conservative measures and elected for surgical management.    The risks benefits and alternatives were discussed with the patient preoperatively including but not limited to the risks of infection, bleeding, nerve injury, cardiopulmonary complications, the need for revision surgery, among others, and the patient was willing to proceed.  OPERATIVE IMPLANTS:   * No implants in log *  @ENCIMAGES @  OPERATIVE FINDINGS: Anterior drawer preoperatively showed 3 mm of anterior drawer and laxity with varus tilt.  Postoperatively anterior drawer was stable with no laxity and no talar tilt.  OPERATIVE PROCEDURE: Patient brought the operating room underwent a regional anesthetic.  She then underwent a MAC anesthetic.  After adequate levels anesthesia were obtained patient's left lower extremity was prepped using DuraPrep draped into a sterile field a timeout was called.  A curvilinear incision was made over the anterior talofibular ligament.  Blunt dissection was carried down to the ligament.  A football shape of the ligament was ellipsed out.  There was a joint effusion this was irrigated there were no loose bodies.  The foot was then held in eversion and the retinaculum was reinforced with 2-0 FiberWire.  Ankle had good stability after reinforcement.  Anterior drawer was stable varus tibial tilt was negative.  Wounds irrigated normal saline incision closed using 2-0 nylon sterile dressing was applied patient was taken the PACU in stable condition.   DISCHARGE PLANNING:  Antibiotic  duration: Preoperative antibiotics only  Weightbearing: Touchdown weightbearing on the left  Pain medication: Prescription for Percocet  Dressing care/ Wound VAC: Dry dressing  Ambulatory devices: Crutches and a fracture boot  Discharge to: Home.  Follow-up: In the office 1 week post operative.

## 2023-04-03 NOTE — Transfer of Care (Signed)
Immediate Anesthesia Transfer of Care Note  Patient: Lisa [name]  Procedure(s) Performed: LEFT LATERAL ANKLE LIGAMENT RECONSTRUCTION (Left: Ankle)  Patient Location: PACU  Anesthesia Type:MAC  Level of Consciousness: awake, alert , oriented, and patient cooperative  Airway & Oxygen Therapy: Patient Spontanous Breathing and Patient connected to nasal cannula oxygen  Post-op Assessment: Report given to RN and Post -op Vital signs reviewed and stable  Post vital signs: Reviewed and stable  Last Vitals:  Vitals Value Taken Time  BP 102/90 04/03/23 0909  Temp    Pulse    Resp 14 04/03/23 0910  SpO2    Vitals shown include unfiled device data.  Last Pain:  Vitals:   04/03/23 0707  PainSc: 0-No pain      Patients Stated Pain Goal: 0 (04/03/23 0707)  Complications: No notable events documented.

## 2023-04-04 ENCOUNTER — Encounter (HOSPITAL_COMMUNITY): Payer: Self-pay | Admitting: Orthopedic Surgery

## 2023-04-09 ENCOUNTER — Encounter (HOSPITAL_COMMUNITY): Payer: Self-pay | Admitting: Orthopedic Surgery

## 2023-04-09 MED ORDER — BUPIVACAINE-EPINEPHRINE (PF) 0.5% -1:200000 IJ SOLN
INTRAMUSCULAR | Status: DC | PRN
  Administered 2023-04-03: 25 mL via PERINEURAL

## 2023-04-09 MED ORDER — LIDOCAINE-EPINEPHRINE (PF) 1.5 %-1:200000 IJ SOLN
INTRAMUSCULAR | Status: DC | PRN
  Administered 2023-04-03: 5 mL via PERINEURAL

## 2023-04-09 NOTE — Anesthesia Postprocedure Evaluation (Signed)
Anesthesia Post Note  Patient: Lisa [name]  Procedure(s) Performed: LEFT LATERAL ANKLE LIGAMENT RECONSTRUCTION (Left: Ankle)     Patient location during evaluation: PACU Anesthesia Type: Regional and MAC Level of consciousness: awake and alert Pain management: pain level controlled Vital Signs Assessment: post-procedure vital signs reviewed and stable Respiratory status: spontaneous breathing, nonlabored ventilation and respiratory function stable Cardiovascular status: stable and blood pressure returned to baseline Postop Assessment: no apparent nausea or vomiting Anesthetic complications: no   No notable events documented.  Last Vitals:  Vitals:   04/03/23 0915 04/03/23 0930  BP: 116/76 120/83  Pulse:  (!) 104  Resp: 13 12  Temp:  37.1 C  SpO2:  99%    Last Pain:  Vitals:   04/03/23 0930  PainSc: 0-No pain                 Adom Schoeneck

## 2023-04-09 NOTE — Anesthesia Procedure Notes (Signed)
Anesthesia Regional Block: Popliteal block   Pre-Anesthetic Checklist: , timeout performed,  Correct Patient, Correct Site, Correct Laterality,  Correct Procedure, Correct Position, site marked,  Risks and benefits discussed,  Surgical consent,  Pre-op evaluation,  At surgeon's request and post-op pain management  Laterality: Left  Prep: chloraprep       Needles:  Injection technique: Single-shot      Needle Length: 9cm  Needle Gauge: 22     Additional Needles: Arrow StimuQuik ECHO Echogenic Stimulating PNB Needle  Procedures:,,,, ultrasound used (permanent image in chart),,    Narrative:  Start time: 04/03/2023 8:01 AM End time: 04/03/2023 8:08 AM Injection made incrementally with aspirations every 5 mL.  Performed by: Personally  Anesthesiologist: Val Eagle, MD

## 2023-04-12 ENCOUNTER — Ambulatory Visit (INDEPENDENT_AMBULATORY_CARE_PROVIDER_SITE_OTHER): Payer: Worker's Compensation | Admitting: Family

## 2023-04-12 ENCOUNTER — Encounter: Payer: Self-pay | Admitting: Family

## 2023-04-12 DIAGNOSIS — S93492S Sprain of other ligament of left ankle, sequela: Secondary | ICD-10-CM

## 2023-04-12 NOTE — Progress Notes (Signed)
Post-Op Visit Note   Patient: Lisa [name]           Date of Birth: 04/24/1971           MRN: [id number] Visit Date: 04/12/2023 PCP: Pearline Cables, MD  Chief Complaint:  Chief Complaint  Patient presents with   Left Ankle - Routine Post Op    04/03/2023 left lateral ankle ligament reconstruction     HPI:  HPI The patient is a 52 year old woman who presents status post left lateral ankle ligament reconstruction she has been nonweightbearing in her cam walker Ortho Exam On examination of the left ankle the lateral ankle incision is well-approximated with sutures appears to be healing quite well.  No gaping drainage or edema Visit Diagnoses: No diagnosis found.  Plan: Begin daily Dial soap cleansing.  Dry dressings.  Continue nonweightbearing in the cam boot. Follow-Up Instructions: No follow-ups on file.   Imaging: No results found.  Orders:  No orders of the defined types were placed in this encounter.  No orders of the defined types were placed in this encounter.    PMFS History: Patient Active Problem List   Diagnosis Date Noted   Sprain of anterior talofibular ligament of left ankle 04/03/2023   Unstable ankle, left 04/03/2023   Closed displaced fracture of lateral malleolus of right fibula 02/10/2023   Elevated ferritin 08/27/2019   Fecal incontinence 01/15/2018   Pre-diabetes 02/14/2017   Left anterior cruciate ligament tear 08/03/2016   Acute medial meniscus tear of left knee 08/03/2016   Acute lateral meniscus tear of left knee 08/03/2016   History of abnormal cervical Pap smear 09/21/2013   Leukocytopenia 03/02/2013   S/P hip replacement 02/06/2013   Hyperlipidemia 10/13/2012   HTN (hypertension) 10/13/2012   Past Medical History:  Diagnosis Date   Anemia    hx of   Anxiety 20 years ago   Depression 20 years ago   Diabetes mellitus without complication (HCC)    type 2   GERD (gastroesophageal reflux disease)    Headache    Heart murmur     in childhood, no problems as an adult   History of blood transfusion 08/21/2022   History of kidney stones    passed stones   HSV (herpes simplex virus) infection    Hyperlipidemia    Hypertension    history of htn, no meds now   Substance abuse (HCC)    last use in her 30s, marijuana cocaine and crack   Ulcer    x2   Vaginal Pap smear, abnormal     Family History  Adopted: Yes  Family history unknown: Yes    Past Surgical History:  Procedure Laterality Date   ANKLE RECONSTRUCTION Left 04/03/2023   Procedure: LEFT LATERAL ANKLE LIGAMENT RECONSTRUCTION;  Surgeon: Nadara Mustard, MD;  Location: [facility] OR;  Service: Orthopedics;  Laterality: Left;   ANTERIOR CRUCIATE LIGAMENT REPAIR Left 09/04/2016   Procedure: LEFT KNEE MEDIAL COLLATERAL LIGAMENT REPAIR, ANTERIOR CRUCIATE LIGAMENT RECONSTRUCTION, MENISCAL DEBRIDEMENT VERSUS REPAIR;  Surgeon: Cammy Copa, MD;  Location: [facility] OR;  Service: Orthopedics;  Laterality: Left;   APPENDECTOMY     CHOLECYSTECTOMY N/A 08/20/2022   Procedure: LAPAROSCOPIC CHOLECYSTECTOMY;  Surgeon: Fritzi Mandes, MD;  Location: [facility] OR;  Service: General;  Laterality: N/A;   ESOPHAGOGASTRODUODENOSCOPY ENDOSCOPY     at least 4 last one 07/2022   JOINT REPLACEMENT     LAPAROSCOPIC APPENDECTOMY N/A 02/23/2015   Procedure: APPENDECTOMY LAPAROSCOPIC;  Surgeon: Minerva Areola  Andrey Campanile, MD;  Location: [facility];  Service: General;  Laterality: N/A;   ORIF ANKLE FRACTURE Right 01/22/2023   Procedure: OPEN REDUCTION INTERNAL FIXATION (ORIF) RIGHT ANKLE FRACTURE;  Surgeon: Cammy Copa, MD;  Location: [facility] OR;  Service: Orthopedics;  Laterality: Right;   SPINAL CORD STIMULATOR IMPLANT  2020   [employer]   TOTAL HIP ARTHROPLASTY Left 02/06/2013   Procedure: LEFT TOTAL HIP ARTHROPLASTY ANTERIOR APPROACH;  Surgeon: Kathryne Hitch, MD;  Location: [facility]S;  Service: Orthopedics;  Laterality: Left;   Social History   Occupational History   Not on file  Tobacco Use   Smoking  status: Former    Current packs/day: 0.00    Average packs/day: 0.3 packs/day for 20.0 years (5.0 ttl pk-yrs)    Types: Cigarettes    Start date: 08/13/1993    Quit date: 08/13/2013    Years since quitting: 9.6   Smokeless tobacco: Never  Vaping Use   Vaping status: Never Used  Substance and Sexual Activity   Alcohol use: Yes    Alcohol/week: 3.0 - 4.0 standard drinks of alcohol    Types: 3 - 4 Glasses of wine per week    Comment: few glasses of wine a week   Drug use: Yes    Types: Marijuana, Cocaine, "Crack" cocaine    Comment: last used back in her 30s   Sexual activity: Yes    Birth control/protection: Injection    Comment: Last Depo Inj 12/19/22

## 2023-04-18 ENCOUNTER — Encounter: Payer: Self-pay | Admitting: Family Medicine

## 2023-04-18 DIAGNOSIS — K219 Gastro-esophageal reflux disease without esophagitis: Secondary | ICD-10-CM

## 2023-04-18 DIAGNOSIS — G43009 Migraine without aura, not intractable, without status migrainosus: Secondary | ICD-10-CM

## 2023-04-19 ENCOUNTER — Other Ambulatory Visit: Payer: Self-pay | Admitting: Family Medicine

## 2023-04-19 ENCOUNTER — Other Ambulatory Visit: Payer: Self-pay

## 2023-04-19 DIAGNOSIS — K219 Gastro-esophageal reflux disease without esophagitis: Secondary | ICD-10-CM

## 2023-04-19 MED ORDER — PANTOPRAZOLE SODIUM 40 MG PO TBEC
40.0000 mg | DELAYED_RELEASE_TABLET | Freq: Every day | ORAL | 0 refills | Status: DC
Start: 2023-04-19 — End: 2023-04-19

## 2023-04-19 MED ORDER — SUMATRIPTAN SUCCINATE 50 MG PO TABS: ORAL_TABLET | ORAL | 0 refills | Status: DC

## 2023-04-19 MED ORDER — PANTOPRAZOLE SODIUM 40 MG PO TBEC
40.0000 mg | DELAYED_RELEASE_TABLET | Freq: Every day | ORAL | 0 refills | Status: DC
Start: 2023-04-19 — End: 2023-07-19

## 2023-04-24 ENCOUNTER — Encounter: Payer: Self-pay | Admitting: Family

## 2023-04-24 ENCOUNTER — Ambulatory Visit (INDEPENDENT_AMBULATORY_CARE_PROVIDER_SITE_OTHER): Payer: Worker's Compensation | Admitting: Family

## 2023-04-24 DIAGNOSIS — S93492S Sprain of other ligament of left ankle, sequela: Secondary | ICD-10-CM

## 2023-04-24 NOTE — Progress Notes (Signed)
Post-Op Visit Note   Patient: Lisa [name]           Date of Birth: 11-07-1970           MRN: [id number] Visit Date: 04/24/2023 PCP: Pearline Cables, MD  Chief Complaint:  Chief Complaint  Patient presents with   Left Ankle - Routine Post Op    04/03/2023 left lateral ankle ligament reconstruction     HPI:  HPI The patient is a 52 year old woman who presents status post left lateral ankle ligament reconstruction she has been nonweightbearing in her cam walker with a kneeling scooter. Ortho Exam On examination left ankle her incision is well-healed there is no gaping no erythema no drainage no edema  Visit Diagnoses: No diagnosis found.  Plan: Continue daily dose of class setting.  She may continue the cam walker.  Begin full weightbearing in the cam.  She also is instructed on removing the boot working on range of motion of the ankle  Follow-Up Instructions: No follow-ups on file.   Imaging: No results found.  Orders:  No orders of the defined types were placed in this encounter.  No orders of the defined types were placed in this encounter.    PMFS History: Patient Active Problem List   Diagnosis Date Noted   Sprain of anterior talofibular ligament of left ankle 04/03/2023   Unstable ankle, left 04/03/2023   Closed displaced fracture of lateral malleolus of right fibula 02/10/2023   Elevated ferritin 08/27/2019   Fecal incontinence 01/15/2018   Pre-diabetes 02/14/2017   Left anterior cruciate ligament tear 08/03/2016   Acute medial meniscus tear of left knee 08/03/2016   Acute lateral meniscus tear of left knee 08/03/2016   History of abnormal cervical Pap smear 09/21/2013   Leukocytopenia 03/02/2013   S/P hip replacement 02/06/2013   Hyperlipidemia 10/13/2012   HTN (hypertension) 10/13/2012   Past Medical History:  Diagnosis Date   Anemia    hx of   Anxiety 20 years ago   Depression 20 years ago   Diabetes mellitus without complication (HCC)    type  2   GERD (gastroesophageal reflux disease)    Headache    Heart murmur    in childhood, no problems as an adult   History of blood transfusion 08/21/2022   History of kidney stones    passed stones   HSV (herpes simplex virus) infection    Hyperlipidemia    Hypertension    history of htn, no meds now   Substance abuse (HCC)    last use in her 30s, marijuana cocaine and crack   Ulcer    x2   Vaginal Pap smear, abnormal     Family History  Adopted: Yes  Family history unknown: Yes    Past Surgical History:  Procedure Laterality Date   ANKLE RECONSTRUCTION Left 04/03/2023   Procedure: LEFT LATERAL ANKLE LIGAMENT RECONSTRUCTION;  Surgeon: Nadara Mustard, MD;  Location: [facility];  Service: Orthopedics;  Laterality: Left;   ANTERIOR CRUCIATE LIGAMENT REPAIR Left 09/04/2016   Procedure: LEFT KNEE MEDIAL COLLATERAL LIGAMENT REPAIR, ANTERIOR CRUCIATE LIGAMENT RECONSTRUCTION, MENISCAL DEBRIDEMENT VERSUS REPAIR;  Surgeon: Cammy Copa, MD;  Location: [facility] OR;  Service: Orthopedics;  Laterality: Left;   APPENDECTOMY     CHOLECYSTECTOMY N/A 08/20/2022   Procedure: LAPAROSCOPIC CHOLECYSTECTOMY;  Surgeon: Fritzi Mandes, MD;  Location: [facility] OR;  Service: General;  Laterality: N/A;   ESOPHAGOGASTRODUODENOSCOPY ENDOSCOPY     at least 4 last one 07/2022  JOINT REPLACEMENT     LAPAROSCOPIC APPENDECTOMY N/A 02/23/2015   Procedure: APPENDECTOMY LAPAROSCOPIC;  Surgeon: Gaynelle Adu, MD;  Location: [facility];  Service: General;  Laterality: N/A;   ORIF ANKLE FRACTURE Right 01/22/2023   Procedure: OPEN REDUCTION INTERNAL FIXATION (ORIF) RIGHT ANKLE FRACTURE;  Surgeon: Cammy Copa, MD;  Location: [facility] OR;  Service: Orthopedics;  Laterality: Right;   SPINAL CORD STIMULATOR IMPLANT  2020   [employer]   TOTAL HIP ARTHROPLASTY Left 02/06/2013   Procedure: LEFT TOTAL HIP ARTHROPLASTY ANTERIOR APPROACH;  Surgeon: Kathryne Hitch, MD;  Location: [facility];  Service: Orthopedics;  Laterality: Left;    Social History   Occupational History   Not on file  Tobacco Use   Smoking status: Former    Current packs/day: 0.00    Average packs/day: 0.3 packs/day for 20.0 years (5.0 ttl pk-yrs)    Types: Cigarettes    Start date: 08/13/1993    Quit date: 08/13/2013    Years since quitting: 9.7   Smokeless tobacco: Never  Vaping Use   Vaping status: Never Used  Substance and Sexual Activity   Alcohol use: Yes    Alcohol/week: 3.0 - 4.0 standard drinks of alcohol    Types: 3 - 4 Glasses of wine per week    Comment: few glasses of wine a week   Drug use: Yes    Types: Marijuana, Cocaine, "Crack" cocaine    Comment: last used back in her 30s   Sexual activity: Yes    Birth control/protection: Injection    Comment: Last Depo Inj 12/19/22

## 2023-04-30 ENCOUNTER — Telehealth: Payer: Self-pay | Admitting: Orthopedic Surgery

## 2023-04-30 NOTE — Telephone Encounter (Signed)
Pt called in stating she will not have a ride for her appt next week if its on Tuesday and she needs it to be on Wednesday at 10:45 instead please advise

## 2023-05-03 ENCOUNTER — Encounter: Payer: Self-pay | Admitting: Family Medicine

## 2023-05-03 DIAGNOSIS — I10 Essential (primary) hypertension: Secondary | ICD-10-CM

## 2023-05-03 MED ORDER — LISINOPRIL 10 MG PO TABS
10.0000 mg | ORAL_TABLET | Freq: Every day | ORAL | 2 refills | Status: DC
Start: 2023-05-03 — End: 2023-12-16

## 2023-05-07 ENCOUNTER — Encounter: Payer: BC Managed Care – PPO | Admitting: Orthopedic Surgery

## 2023-05-09 ENCOUNTER — Ambulatory Visit (INDEPENDENT_AMBULATORY_CARE_PROVIDER_SITE_OTHER): Payer: Worker's Compensation | Admitting: Orthopedic Surgery

## 2023-05-09 DIAGNOSIS — S93492S Sprain of other ligament of left ankle, sequela: Secondary | ICD-10-CM

## 2023-05-11 ENCOUNTER — Encounter: Payer: Self-pay | Admitting: Orthopedic Surgery

## 2023-05-11 NOTE — Progress Notes (Signed)
Office Visit Note   Patient: Lisa [name]           Date of Birth: 07/12/1971           MRN: [id number] Visit Date: 05/09/2023              Requested by: Pearline Cables, MD [address] [place],  [place] [address] PCP: Pearline Cables, MD  Chief Complaint  Patient presents with   Left Ankle - Routine Post Op    04/03/2023 left lateral ankle ligament reconstruction       HPI: Patient is a 52 year old woman who is 5 weeks status post lateral ligament reconstruction left ankle.  She is currently in a cam walker for weightbearing.  Assessment & Plan: Visit Diagnoses:  1. Sprain of anterior talofibular ligament of left ankle, sequela     Plan: Patient will continue out of work through October 21.  She will advance to an ASO and sneaker at this time.  Follow-Up Instructions: No follow-ups on file.   Ortho Exam  Patient is alert, oriented, no adenopathy, well-dressed, normal affect, normal respiratory effort. Examination the incision is well-healed anterior drawer is stable.  Imaging: No results found. No images are attached to the encounter.  Labs: Lab Results  Component Value Date   HGBA1C 6.3 (H) 08/14/2022   HGBA1C 6.8 (H) 06/13/2022   HGBA1C 6.4 11/01/2021   ESRSEDRATE 14 02/11/2019   CRP 0.5 02/11/2019   LABURIC 6.4 02/11/2019     Lab Results  Component Value Date   ALBUMIN 4.5 08/14/2022   ALBUMIN 4.7 11/01/2021   ALBUMIN 4.9 11/03/2020    No results found for: "MG" Lab Results  Component Value Date   VD25OH 21.52 (L) 11/03/2020   VD25OH 5.4 (L) 04/07/2019    No results found for: "PREALBUMIN"    Latest Ref Rng & Units 04/03/2023    7:06 AM 01/22/2023    2:26 PM 08/14/2022    9:30 AM  CBC EXTENDED  WBC 4.0 - 10.5 K/uL 4.8  4.9  5.4   RBC 3.87 - 5.11 MIL/uL 4.29  4.27  4.53   Hemoglobin 12.0 - 15.0 g/dL 21.3  08.6  57.8   HCT 36.0 - 46.0 % 38.6  38.8  41.3   Platelets 150 - 400 K/uL 260  296  275      There is no height  or weight on file to calculate BMI.  Orders:  No orders of the defined types were placed in this encounter.  No orders of the defined types were placed in this encounter.    Procedures: No procedures performed  Clinical Data: No additional findings.  ROS:  All other systems negative, except as noted in the HPI. Review of Systems  Objective: Vital Signs: There were no vitals taken for this visit.  Specialty Comments:  No specialty comments available.  PMFS History: Patient Active Problem List   Diagnosis Date Noted   Sprain of anterior talofibular ligament of left ankle 04/03/2023   Unstable ankle, left 04/03/2023   Closed displaced fracture of lateral malleolus of right fibula 02/10/2023   Elevated ferritin 08/27/2019   Fecal incontinence 01/15/2018   Pre-diabetes 02/14/2017   Left anterior cruciate ligament tear 08/03/2016   Acute medial meniscus tear of left knee 08/03/2016   Acute lateral meniscus tear of left knee 08/03/2016   History of abnormal cervical Pap smear 09/21/2013   Leukocytopenia 03/02/2013   S/P hip replacement 02/06/2013  Hyperlipidemia 10/13/2012   HTN (hypertension) 10/13/2012   Past Medical History:  Diagnosis Date   Anemia    hx of   Anxiety 20 years ago   Depression 20 years ago   Diabetes mellitus without complication (HCC)    type 2   GERD (gastroesophageal reflux disease)    Headache    Heart murmur    in childhood, no problems as an adult   History of blood transfusion 08/21/2022   History of kidney stones    passed stones   HSV (herpes simplex virus) infection    Hyperlipidemia    Hypertension    history of htn, no meds now   Substance abuse (HCC)    last use in her 30s, marijuana cocaine and crack   Ulcer    x2   Vaginal Pap smear, abnormal     Family History  Adopted: Yes  Family history unknown: Yes    Past Surgical History:  Procedure Laterality Date   ANKLE RECONSTRUCTION Left 04/03/2023   Procedure: LEFT  LATERAL ANKLE LIGAMENT RECONSTRUCTION;  Surgeon: Nadara Mustard, MD;  Location: [facility] OR;  Service: Orthopedics;  Laterality: Left;   ANTERIOR CRUCIATE LIGAMENT REPAIR Left 09/04/2016   Procedure: LEFT KNEE MEDIAL COLLATERAL LIGAMENT REPAIR, ANTERIOR CRUCIATE LIGAMENT RECONSTRUCTION, MENISCAL DEBRIDEMENT VERSUS REPAIR;  Surgeon: Cammy Copa, MD;  Location: [facility] OR;  Service: Orthopedics;  Laterality: Left;   APPENDECTOMY     CHOLECYSTECTOMY N/A 08/20/2022   Procedure: LAPAROSCOPIC CHOLECYSTECTOMY;  Surgeon: Fritzi Mandes, MD;  Location: [facility] OR;  Service: General;  Laterality: N/A;   ESOPHAGOGASTRODUODENOSCOPY ENDOSCOPY     at least 4 last one 07/2022   JOINT REPLACEMENT     LAPAROSCOPIC APPENDECTOMY N/A 02/23/2015   Procedure: APPENDECTOMY LAPAROSCOPIC;  Surgeon: Gaynelle Adu, MD;  Location: [facility];  Service: General;  Laterality: N/A;   ORIF ANKLE FRACTURE Right 01/22/2023   Procedure: OPEN REDUCTION INTERNAL FIXATION (ORIF) RIGHT ANKLE FRACTURE;  Surgeon: Cammy Copa, MD;  Location: [facility] OR;  Service: Orthopedics;  Laterality: Right;   SPINAL CORD STIMULATOR IMPLANT  2020   [employer]   TOTAL HIP ARTHROPLASTY Left 02/06/2013   Procedure: LEFT TOTAL HIP ARTHROPLASTY ANTERIOR APPROACH;  Surgeon: Kathryne Hitch, MD;  Location: [facility];  Service: Orthopedics;  Laterality: Left;   Social History   Occupational History   Not on file  Tobacco Use   Smoking status: Former    Current packs/day: 0.00    Average packs/day: 0.3 packs/day for 20.0 years (5.0 ttl pk-yrs)    Types: Cigarettes    Start date: 08/13/1993    Quit date: 08/13/2013    Years since quitting: 9.7   Smokeless tobacco: Never  Vaping Use   Vaping status: Never Used  Substance and Sexual Activity   Alcohol use: Yes    Alcohol/week: 3.0 - 4.0 standard drinks of alcohol    Types: 3 - 4 Glasses of wine per week    Comment: few glasses of wine a week   Drug use: Yes    Types: Marijuana, Cocaine, "Crack" cocaine     Comment: last used back in her 30s   Sexual activity: Yes    Birth control/protection: Injection    Comment: Last Depo Inj 12/19/22

## 2023-05-27 ENCOUNTER — Ambulatory Visit (INDEPENDENT_AMBULATORY_CARE_PROVIDER_SITE_OTHER): Payer: Worker's Compensation | Admitting: Orthopedic Surgery

## 2023-05-27 ENCOUNTER — Encounter: Payer: Self-pay | Admitting: Orthopedic Surgery

## 2023-05-27 DIAGNOSIS — S93492S Sprain of other ligament of left ankle, sequela: Secondary | ICD-10-CM

## 2023-05-27 NOTE — Progress Notes (Signed)
Office Visit Note   Patient: Lisa [name]           Date of Birth: [birth year]           MRN: [id number] Visit Date: 05/27/2023              Requested by: Pearline Cables, MD [address] [place],  [place] [address] PCP: Pearline Cables, MD  Chief Complaint  Patient presents with   Left Ankle - Routine Post Op    04/03/2023 left lateral ankle ligament reconstruction       HPI: Patient is a 52 year old woman who presents almost 2 months status post left lateral ankle ligament reconstruction.  She is wearing an ASO and supportive sneakers.  She  Assessment & Plan: Visit Diagnoses:  1. Sprain of anterior talofibular ligament of left ankle, sequela     Plan: Patient was provided a note to continue wearing the supportive athletic sneakers at work.  She will continue wearing the ASO and wean out as she feels comfortable.  Follow-Up Instructions: No follow-ups on file.   Ortho Exam  Patient is alert, oriented, no adenopathy, well-dressed, normal affect, normal respiratory effort. Examination the incision is well-healed she has good stable subtalar motion.  Anterior drawer is negative.  Imaging: No results found. No images are attached to the encounter.  Labs: Lab Results  Component Value Date   HGBA1C 6.3 (H) 08/14/2022   HGBA1C 6.8 (H) 06/13/2022   HGBA1C 6.4 11/01/2021   ESRSEDRATE 14 02/11/2019   CRP 0.5 02/11/2019   LABURIC 6.4 02/11/2019     Lab Results  Component Value Date   ALBUMIN 4.5 08/14/2022   ALBUMIN 4.7 11/01/2021   ALBUMIN 4.9 11/03/2020    No results found for: "MG" Lab Results  Component Value Date   VD25OH 21.52 (L) 11/03/2020   VD25OH 5.4 (L) 04/07/2019    No results found for: "PREALBUMIN"    Latest Ref Rng & Units 04/03/2023    7:06 AM 01/22/2023    2:26 PM 08/14/2022    9:30 AM  CBC EXTENDED  WBC 4.0 - 10.5 K/uL 4.8  4.9  5.4   RBC 3.87 - 5.11 MIL/uL 4.29  4.27  4.53   Hemoglobin 12.0 - 15.0 g/dL 64.4  03.4   74.2   HCT 36.0 - 46.0 % 38.6  38.8  41.3   Platelets 150 - 400 K/uL 260  296  275      There is no height or weight on file to calculate BMI.  Orders:  No orders of the defined types were placed in this encounter.  No orders of the defined types were placed in this encounter.    Procedures: No procedures performed  Clinical Data: No additional findings.  ROS:  All other systems negative, except as noted in the HPI. Review of Systems  Objective: Vital Signs: There were no vitals taken for this visit.  Specialty Comments:  No specialty comments available.  PMFS History: Patient Active Problem List   Diagnosis Date Noted   Sprain of anterior talofibular ligament of left ankle 04/03/2023   Unstable ankle, left 04/03/2023   Closed displaced fracture of lateral malleolus of right fibula 02/10/2023   Elevated ferritin 08/27/2019   Fecal incontinence 01/15/2018   Pre-diabetes 02/14/2017   Left anterior cruciate ligament tear 08/03/2016   Acute medial meniscus tear of left knee 08/03/2016   Acute lateral meniscus tear of left knee 08/03/2016   History  of abnormal cervical Pap smear 09/21/2013   Leukocytopenia 03/02/2013   S/P hip replacement 02/06/2013   Hyperlipidemia 10/13/2012   HTN (hypertension) 10/13/2012   Past Medical History:  Diagnosis Date   Anemia    hx of   Anxiety 20 years ago   Depression 20 years ago   Diabetes mellitus without complication (HCC)    type 2   GERD (gastroesophageal reflux disease)    Headache    Heart murmur    in childhood, no problems as an adult   History of blood transfusion 08/21/2022   History of kidney stones    passed stones   HSV (herpes simplex virus) infection    Hyperlipidemia    Hypertension    history of htn, no meds now   Substance abuse (HCC)    last use in her 30s, marijuana cocaine and crack   Ulcer    x2   Vaginal Pap smear, abnormal     Family History  Adopted: Yes  Family history unknown: Yes     Past Surgical History:  Procedure Laterality Date   ANKLE RECONSTRUCTION Left 04/03/2023   Procedure: LEFT LATERAL ANKLE LIGAMENT RECONSTRUCTION;  Surgeon: Nadara Mustard, MD;  Location: [facility] OR;  Service: Orthopedics;  Laterality: Left;   ANTERIOR CRUCIATE LIGAMENT REPAIR Left 09/04/2016   Procedure: LEFT KNEE MEDIAL COLLATERAL LIGAMENT REPAIR, ANTERIOR CRUCIATE LIGAMENT RECONSTRUCTION, MENISCAL DEBRIDEMENT VERSUS REPAIR;  Surgeon: Cammy Copa, MD;  Location: [facility] OR;  Service: Orthopedics;  Laterality: Left;   APPENDECTOMY     CHOLECYSTECTOMY N/A 08/20/2022   Procedure: LAPAROSCOPIC CHOLECYSTECTOMY;  Surgeon: Fritzi Mandes, MD;  Location: [facility] OR;  Service: General;  Laterality: N/A;   ESOPHAGOGASTRODUODENOSCOPY ENDOSCOPY     at least 4 last one 07/2022   JOINT REPLACEMENT     LAPAROSCOPIC APPENDECTOMY N/A 02/23/2015   Procedure: APPENDECTOMY LAPAROSCOPIC;  Surgeon: Gaynelle Adu, MD;  Location: [facility];  Service: General;  Laterality: N/A;   ORIF ANKLE FRACTURE Right 01/22/2023   Procedure: OPEN REDUCTION INTERNAL FIXATION (ORIF) RIGHT ANKLE FRACTURE;  Surgeon: Cammy Copa, MD;  Location: [facility] OR;  Service: Orthopedics;  Laterality: Right;   SPINAL CORD STIMULATOR IMPLANT  2020   [employer]   TOTAL HIP ARTHROPLASTY Left 02/06/2013   Procedure: LEFT TOTAL HIP ARTHROPLASTY ANTERIOR APPROACH;  Surgeon: Kathryne Hitch, MD;  Location: [facility];  Service: Orthopedics;  Laterality: Left;   Social History   Occupational History   Not on file  Tobacco Use   Smoking status: Former    Current packs/day: 0.00    Average packs/day: 0.3 packs/day for 20.0 years (5.0 ttl pk-yrs)    Types: Cigarettes    Start date: 08/13/1993    Quit date: 08/13/2013    Years since quitting: 9.7   Smokeless tobacco: Never  Vaping Use   Vaping status: Never Used  Substance and Sexual Activity   Alcohol use: Yes    Alcohol/week: 3.0 - 4.0 standard drinks of alcohol    Types: 3 - 4 Glasses of wine per  week    Comment: few glasses of wine a week   Drug use: Yes    Types: Marijuana, Cocaine, "Crack" cocaine    Comment: last used back in her 30s   Sexual activity: Yes    Birth control/protection: Injection    Comment: Last Depo Inj 12/19/22

## 2023-05-29 ENCOUNTER — Encounter: Payer: Self-pay | Admitting: Family Medicine

## 2023-05-29 DIAGNOSIS — Z1231 Encounter for screening mammogram for malignant neoplasm of breast: Secondary | ICD-10-CM | POA: Diagnosis not present

## 2023-05-29 LAB — HM MAMMOGRAPHY

## 2023-06-10 ENCOUNTER — Telehealth: Payer: Self-pay | Admitting: Orthopedic Surgery

## 2023-06-10 ENCOUNTER — Ambulatory Visit: Payer: BC Managed Care – PPO | Admitting: Orthopedic Surgery

## 2023-06-10 NOTE — Telephone Encounter (Signed)
Tisha informed.

## 2023-06-10 NOTE — Telephone Encounter (Signed)
Pt is s/p 04/03/23 left lateral ankle ligament reconstruction. W.C. needs the last OV note 05/27/23 to have any work restrictions stated on it.  She has been in an athletic shoe and ASO at work, but need to state if any restrictions.

## 2023-06-13 ENCOUNTER — Ambulatory Visit: Payer: BC Managed Care – PPO | Admitting: Family Medicine

## 2023-06-13 ENCOUNTER — Encounter: Payer: Self-pay | Admitting: Family Medicine

## 2023-06-13 VITALS — BP 120/70 | HR 93 | Resp 18 | Ht 68.0 in | Wt 186.4 lb

## 2023-06-13 DIAGNOSIS — Z130 Encounter for screening for diseases of the blood and blood-forming organs and certain disorders involving the immune mechanism: Secondary | ICD-10-CM | POA: Diagnosis not present

## 2023-06-13 DIAGNOSIS — Z1329 Encounter for screening for other suspected endocrine disorder: Secondary | ICD-10-CM

## 2023-06-13 DIAGNOSIS — Z0000 Encounter for general adult medical examination without abnormal findings: Secondary | ICD-10-CM | POA: Diagnosis not present

## 2023-06-13 DIAGNOSIS — Z23 Encounter for immunization: Secondary | ICD-10-CM

## 2023-06-13 DIAGNOSIS — Z131 Encounter for screening for diabetes mellitus: Secondary | ICD-10-CM | POA: Diagnosis not present

## 2023-06-13 DIAGNOSIS — H524 Presbyopia: Secondary | ICD-10-CM | POA: Diagnosis not present

## 2023-06-13 DIAGNOSIS — Z3042 Encounter for surveillance of injectable contraceptive: Secondary | ICD-10-CM | POA: Diagnosis not present

## 2023-06-13 DIAGNOSIS — H52223 Regular astigmatism, bilateral: Secondary | ICD-10-CM | POA: Diagnosis not present

## 2023-06-13 DIAGNOSIS — E7849 Other hyperlipidemia: Secondary | ICD-10-CM | POA: Diagnosis not present

## 2023-06-13 DIAGNOSIS — F5101 Primary insomnia: Secondary | ICD-10-CM

## 2023-06-13 DIAGNOSIS — R7303 Prediabetes: Secondary | ICD-10-CM | POA: Diagnosis not present

## 2023-06-13 DIAGNOSIS — H5213 Myopia, bilateral: Secondary | ICD-10-CM | POA: Diagnosis not present

## 2023-06-13 LAB — HM DIABETES EYE EXAM

## 2023-06-13 MED ORDER — MEDROXYPROGESTERONE ACETATE 150 MG/ML IM SUSP
150.0000 mg | Freq: Once | INTRAMUSCULAR | Status: AC
Start: 2023-06-13 — End: 2023-06-13
  Administered 2023-06-13: 150 mg via INTRAMUSCULAR

## 2023-06-13 MED ORDER — TRAZODONE HCL 50 MG PO TABS: ORAL_TABLET | ORAL | 1 refills | Status: DC

## 2023-06-13 NOTE — Patient Instructions (Signed)
Good to see you today Next dose of shingrix is due in 2-6 months Depo, flu shot also today You can do your covid booster at your convenience  Let's plan to do pap in March of next year  We can try trazodone up to 150 mg at bedtime as needed for sleep- let me know how this works for [employer]!

## 2023-06-14 ENCOUNTER — Encounter: Payer: Self-pay | Admitting: Family Medicine

## 2023-06-14 LAB — COMPREHENSIVE METABOLIC PANEL
ALT: 23 U/L (ref 0–35)
AST: 22 U/L (ref 0–37)
Albumin: 5 g/dL (ref 3.5–5.2)
Alkaline Phosphatase: 53 U/L (ref 39–117)
BUN: 9 mg/dL (ref 6–23)
CO2: 26 meq/L (ref 19–32)
Calcium: 10.3 mg/dL (ref 8.4–10.5)
Chloride: 100 meq/L (ref 96–112)
Creatinine, Ser: 0.5 mg/dL (ref 0.40–1.20)
GFR: 109.05 mL/min (ref >60.00)
Glucose, Bld: 100 mg/dL — ABNORMAL HIGH (ref 70–99)
Potassium: 4.5 meq/L (ref 3.5–5.1)
Sodium: 137 meq/L (ref 135–145)
Total Bilirubin: 0.7 mg/dL (ref 0.2–1.2)
Total Protein: 7.7 g/dL (ref 6.0–8.3)

## 2023-06-14 LAB — LIPID PANEL
Cholesterol: 216 mg/dL — ABNORMAL HIGH (ref 0–200)
HDL: 75 mg/dL (ref >39.00)
LDL Cholesterol: 115 mg/dL — ABNORMAL HIGH (ref 0–99)
NonHDL: 141.09
Total CHOL/HDL Ratio: 3
Triglycerides: 129 mg/dL (ref 0.0–149.0)
VLDL: 25.8 mg/dL (ref 0.0–40.0)

## 2023-06-14 LAB — CBC
HCT: 38.9 % (ref 36.0–46.0)
Hemoglobin: 12.4 g/dL (ref 12.0–15.0)
MCHC: 31.8 g/dL (ref 30.0–36.0)
MCV: 90.9 fL (ref 78.0–100.0)
Platelets: 284 10*3/uL (ref 150.0–400.0)
RBC: 4.28 Mil/uL (ref 3.87–5.11)
RDW: 13.8 % (ref 11.5–15.5)
WBC: 5.1 10*3/uL (ref 4.0–10.5)

## 2023-06-14 LAB — TSH: TSH: 1.37 u[IU]/mL (ref 0.35–5.50)

## 2023-06-14 LAB — HEMOGLOBIN A1C: Hgb A1c MFr Bld: 6.5 % (ref 4.6–6.5)

## 2023-06-19 MED ORDER — ROSUVASTATIN CALCIUM 20 MG PO TABS: 20.0000 mg | ORAL_TABLET | Freq: Every day | ORAL | 1 refills | Status: DC

## 2023-06-20 ENCOUNTER — Other Ambulatory Visit: Payer: Self-pay | Admitting: Family Medicine

## 2023-06-25 ENCOUNTER — Encounter: Payer: Self-pay | Admitting: Family Medicine

## 2023-06-25 DIAGNOSIS — R7303 Prediabetes: Secondary | ICD-10-CM

## 2023-06-25 MED ORDER — METFORMIN HCL 500 MG PO TABS: 500.0000 mg | ORAL_TABLET | Freq: Every day | ORAL | 3 refills | Status: DC

## 2023-06-27 DIAGNOSIS — H53413 Scotoma involving central area, bilateral: Secondary | ICD-10-CM | POA: Diagnosis not present

## 2023-06-27 DIAGNOSIS — H401132 Primary open-angle glaucoma, bilateral, moderate stage: Secondary | ICD-10-CM | POA: Diagnosis not present

## 2023-07-05 ENCOUNTER — Encounter: Payer: Self-pay | Admitting: Family Medicine

## 2023-07-15 DIAGNOSIS — R0600 Dyspnea, unspecified: Secondary | ICD-10-CM | POA: Diagnosis not present

## 2023-07-15 DIAGNOSIS — Z20822 Contact with and (suspected) exposure to covid-19: Secondary | ICD-10-CM | POA: Diagnosis not present

## 2023-07-15 DIAGNOSIS — R519 Headache, unspecified: Secondary | ICD-10-CM | POA: Diagnosis not present

## 2023-07-19 ENCOUNTER — Other Ambulatory Visit: Payer: Self-pay | Admitting: Family Medicine

## 2023-07-19 DIAGNOSIS — K219 Gastro-esophageal reflux disease without esophagitis: Secondary | ICD-10-CM

## 2023-08-01 ENCOUNTER — Encounter: Payer: Self-pay | Admitting: Family Medicine

## 2023-08-01 NOTE — Telephone Encounter (Signed)
 Care team updated and letter sent for eye exam notes.

## 2023-08-21 ENCOUNTER — Telehealth: Payer: Self-pay

## 2023-08-21 NOTE — Telephone Encounter (Signed)
 Copied from CRM [id number]. Topic: Appointments - Scheduling Inquiry for Clinic >> Aug 21, 2023 10:41 AM Merlynn A wrote: Reason for CRM: Patient called in to schedule Depo shot with Dr. Watt. Please process order for Depo shot for patient and contact patient to schedule appointment. Patient can be reached at work number at [phone], or primary number [phone].

## 2023-08-21 NOTE — Telephone Encounter (Signed)
 Last injection received on 10/31. Window: 1/16 - 1/30. Will call to schedule.

## 2023-08-22 NOTE — Telephone Encounter (Signed)
 Pt has been scheduled for her injection and her 6 month f/u

## 2023-08-28 ENCOUNTER — Encounter: Payer: Self-pay | Admitting: Family Medicine

## 2023-08-28 DIAGNOSIS — R159 Full incontinence of feces: Secondary | ICD-10-CM

## 2023-08-30 ENCOUNTER — Ambulatory Visit (INDEPENDENT_AMBULATORY_CARE_PROVIDER_SITE_OTHER): Payer: BC Managed Care – PPO | Admitting: Emergency Medicine

## 2023-08-30 DIAGNOSIS — Z3042 Encounter for surveillance of injectable contraceptive: Secondary | ICD-10-CM

## 2023-08-30 MED ORDER — MEDROXYPROGESTERONE ACETATE 150 MG/ML IM SUSP
150.0000 mg | INTRAMUSCULAR | Status: DC
Start: 2023-08-30 — End: 2024-01-01
  Administered 2023-08-30: 150 mg via INTRAMUSCULAR

## 2023-08-30 NOTE — Progress Notes (Signed)
Last Depo-Provera: Pt is within window. Side Effects if any: none. Serum HCG indicated? no. Depo-Provera 150 mg IM given by: Rayford Halsted. Next appointment due 4/4- 4/18.Marland Kitchen

## 2023-10-09 DIAGNOSIS — H47233 Glaucomatous optic atrophy, bilateral: Secondary | ICD-10-CM | POA: Diagnosis not present

## 2023-10-09 DIAGNOSIS — H53413 Scotoma involving central area, bilateral: Secondary | ICD-10-CM | POA: Diagnosis not present

## 2023-10-09 DIAGNOSIS — H401132 Primary open-angle glaucoma, bilateral, moderate stage: Secondary | ICD-10-CM | POA: Diagnosis not present

## 2023-10-18 ENCOUNTER — Other Ambulatory Visit: Payer: Self-pay | Admitting: Family Medicine

## 2023-10-18 DIAGNOSIS — K219 Gastro-esophageal reflux disease without esophagitis: Secondary | ICD-10-CM

## 2023-11-08 ENCOUNTER — Encounter: Payer: Self-pay | Admitting: Family Medicine

## 2023-11-08 NOTE — Telephone Encounter (Signed)
 Called her back- she had the pain 7 and 9 days ago.  She had a similar pain prior to having her GB removed 1/24.  She is not vomiting. The pain is located under her left abdomen- no CP or SOB noted Sx tend to occur in the evening- not related to exercise  I scheduled her to see me on Monday- if she has another episode over the weekend she will go to the ER

## 2023-11-09 NOTE — Progress Notes (Unsigned)
 [facility] [address], [address] [place], [place] [address] [phone]  Date:  11/11/2023   Name:  Lisa [name]   DOB:  11/26/1970   MRN:  [id number]  PCP:  Pearline Cables, MD    Chief Complaint: No chief complaint on file.   History of Present Illness:  Lisa [name] is a 53 y.o. very pleasant female patient who presents with the following:  Patient seen today with concern of abdominal pain Most recent visit with myself was in October for her physical  Pt with history of fecal incontinence now with sacral neuromodulator, HTN, hyperlipidemia, pre-diabetes, left hip replacement due to AVN, hemochromatosis managed by hematology, B12 deficiency Lisa [name] has wanted to stay on Depo-Provera as she has been concerned about pregnancy prevention.  She had several health events in 2024:  She had a cholecystectomy on 09/11/22 She fell and broke her her leg at the end of May- she fell during a fire drill at work and required surgery She then had surgery again in August for repair of the left ankle issue  Due for recheck Pap-can update today Her most recent Depo shot was January 17 Labs done in October  She contacted [place] late last week with concern of abdominal pain-similar to the previous pain she had with her gallbladder but this time on the left side.  When we connected her most recent pain episode was about a week ago, we made this appointment for today  Patient Active Problem List   Diagnosis Date Noted   Sprain of anterior talofibular ligament of left ankle 04/03/2023   Unstable ankle, left 04/03/2023   Closed displaced fracture of lateral malleolus of right fibula 02/10/2023   Elevated ferritin 08/27/2019   Fecal incontinence 01/15/2018   Controlled type 2 diabetes mellitus without complication, without long-term current use of insulin (HCC) 02/14/2017   Left anterior cruciate ligament tear 08/03/2016   Acute medial meniscus  tear of left knee 08/03/2016   Acute lateral meniscus tear of left knee 08/03/2016   History of abnormal cervical Pap smear 09/21/2013   Leukocytopenia 03/02/2013   S/P hip replacement 02/06/2013   Hyperlipidemia 10/13/2012   HTN (hypertension) 10/13/2012    Past Medical History:  Diagnosis Date   Anemia    hx of   Anxiety 20 years ago   Depression 20 years ago   Diabetes mellitus without complication (HCC)    type 2   GERD (gastroesophageal reflux disease)    Headache    Heart murmur    in childhood, no problems as an adult   History of blood transfusion 08/21/2022   History of kidney stones    passed stones   HSV (herpes simplex virus) infection    Hyperlipidemia    Hypertension    history of htn, no meds now   Substance abuse (HCC)    last use in her 30s, marijuana cocaine and crack   Ulcer    x2   Vaginal Pap smear, abnormal     Past Surgical History:  Procedure Laterality Date   ANKLE RECONSTRUCTION Left 04/03/2023   Procedure: LEFT LATERAL ANKLE LIGAMENT RECONSTRUCTION;  Surgeon: Nadara Mustard, MD;  [facility]: [facility];  Service: Orthopedics;  Laterality: Left;   ANTERIOR CRUCIATE LIGAMENT REPAIR Left 09/04/2016   Procedure: LEFT KNEE MEDIAL COLLATERAL LIGAMENT REPAIR, ANTERIOR CRUCIATE LIGAMENT RECONSTRUCTION, MENISCAL DEBRIDEMENT VERSUS REPAIR;  Surgeon: Cammy Copa, MD;  Location: [facility] OR;  Service: Orthopedics;  Laterality: Left;   APPENDECTOMY     CHOLECYSTECTOMY N/A 08/20/2022   Procedure: LAPAROSCOPIC CHOLECYSTECTOMY;  Surgeon: Fritzi Mandes, MD;  Location: [facility] OR;  Service: General;  Laterality: N/A;   ESOPHAGOGASTRODUODENOSCOPY ENDOSCOPY     at least 4 last one 07/2022   JOINT REPLACEMENT     LAPAROSCOPIC APPENDECTOMY N/A 02/23/2015   Procedure: APPENDECTOMY LAPAROSCOPIC;  Surgeon: Gaynelle Adu, MD;  Location: [facility] ORS;  Service: General;  Laterality: N/A;   ORIF ANKLE FRACTURE Right 01/22/2023   Procedure: OPEN REDUCTION INTERNAL FIXATION (ORIF) RIGHT  ANKLE FRACTURE;  Surgeon: Cammy Copa, MD;  Location: [facility] OR;  Service: Orthopedics;  Laterality: Right;   SPINAL CORD STIMULATOR IMPLANT  2020   Medtronic   TOTAL HIP ARTHROPLASTY Left 02/06/2013   Procedure: LEFT TOTAL HIP ARTHROPLASTY ANTERIOR APPROACH;  Surgeon: Kathryne Hitch, MD;  Location: [facility]S;  Service: Orthopedics;  Laterality: Left;    Social History   Tobacco Use   Smoking status: Former    Current packs/day: 0.00    Average packs/day: 0.3 packs/day for 20.0 years (5.0 ttl pk-yrs)    Types: Cigarettes    Start date: 08/13/1993    Quit date: 08/13/2013    Years since quitting: 10.2   Smokeless tobacco: Never  Vaping Use   Vaping status: Never Used  Substance Use Topics   Alcohol use: Yes    Alcohol/week: 3.0 - 4.0 standard drinks of alcohol    Types: 3 - 4 Glasses of wine per week    Comment: few glasses of wine a week   Drug use: Yes    Types: Marijuana, Cocaine, "Crack" cocaine    Comment: last used back in her 30s    Family History  Adopted: Yes  Family history unknown: Yes    Allergies  Allergen Reactions   Nsaids Other (See Comments)    ulcers   Nitroglycerin     Pt took Nitroglycerin patches - pt stated, "Made my skin bright red when I changed my patch; it made my foot itch and burn"    Medication list has been reviewed and updated.  Current Outpatient Medications on File Prior to Visit  Medication Sig Dispense Refill   acetaminophen (TYLENOL) 500 MG tablet Take 1 tablet (500 mg total) by mouth every 6 (six) hours as needed for mild pain. (Patient taking differently: Take 1,000 mg by mouth every 6 (six) hours as needed for mild pain (pain score 1-3).) 30 tablet 0   doxylamine, Sleep, (UNISOM) 25 MG tablet Take 25-50 mg by mouth at bedtime as needed for sleep.     latanoprost (XALATAN) 0.005 % ophthalmic solution Place 1 drop into both eyes at bedtime.     lisinopril (ZESTRIL) 10 MG tablet Take 1 tablet (10 mg total) by mouth daily. 90  tablet 2   medroxyPROGESTERone (DEPO-PROVERA) 150 MG/ML injection Inject 1 mL (150 mg total) into the muscle once. (Patient taking differently: Inject 150 mg into the muscle every 3 (three) months. Last injection 12/19/22) 1 mL 0   metFORMIN (GLUCOPHAGE) 500 MG tablet Take 1 tablet (500 mg total) by mouth daily. 90 tablet 3   pantoprazole (PROTONIX) 40 MG tablet TAKE 1 TABLET(40 MG) BY MOUTH DAILY 90 tablet 0   polycarbophil (FIBERCON) 625 MG tablet Take 1,250 mg by mouth 2 (two) times daily.     rosuvastatin (CRESTOR) 20 MG tablet Take 1 tablet (20 mg total) by mouth daily. 30 tablet 1   simvastatin (ZOCOR)  20 MG tablet TAKE 1 TABLET BY MOUTH EVERY DAY IN THE EVENING 90 tablet 3   SUMAtriptan (IMITREX) 50 MG tablet TAKE 1 TABLET BY MOUTH EVERY 2 (TWO) HOURS AS NEEDED FOR MIGRAINE. MAX 100 MG IN 24 HOURS 9 tablet 0   traZODone (DESYREL) 50 MG tablet Take up to 150 mg at bedtime as needed for insomnia 270 tablet 1   valACYclovir (VALTREX) 1000 MG tablet TAKE 1 TABLET BY MOUTH EVERY DAY (Patient taking differently: Take 1,000 mg by mouth daily as needed (break outs).) 30 tablet 5   Current Facility-Administered Medications on File Prior to Visit  Medication Dose Route Frequency Provider Last Rate Last Admin   medroxyPROGESTERone (DEPO-PROVERA) injection 150 mg  150 mg Intramuscular Q90 days Asanti Craigo, Gwenlyn Found, MD   150 mg at 12/19/22 1551   medroxyPROGESTERone (DEPO-PROVERA) injection 150 mg  150 mg Intramuscular Q90 days Traver Meckes, Gwenlyn Found, MD   150 mg at 08/30/23 0850    Review of Systems:  As per HPI- otherwise negative.   Physical Examination: There were no vitals filed for this visit. There were no vitals filed for this visit. There is no height or weight on file to calculate BMI. Ideal Body Weight:    GEN: no acute distress. HEENT: Atraumatic, Normocephalic.  Ears and Nose: No external deformity. CV: RRR, No M/G/R. No JVD. No thrill. No extra heart sounds. PULM: CTA B, no wheezes,  crackles, rhonchi. No retractions. No resp. distress. No accessory muscle use. ABD: S, NT, ND, +BS. No rebound. No HSM. EXTR: No c/c/e PSYCH: Normally interactive. Conversant.    Assessment and Plan: ***  Signed Abbe Amsterdam, MD

## 2023-11-11 ENCOUNTER — Encounter: Payer: Self-pay | Admitting: Family Medicine

## 2023-11-11 ENCOUNTER — Ambulatory Visit: Admitting: Family Medicine

## 2023-11-11 ENCOUNTER — Other Ambulatory Visit (HOSPITAL_COMMUNITY)
Admission: RE | Admit: 2023-11-11 | Discharge: 2023-11-11 | Disposition: A | Discharge status: Home / Self Care | Source: Ambulatory Visit

## 2023-11-11 ENCOUNTER — Ambulatory Visit (HOSPITAL_BASED_OUTPATIENT_CLINIC_OR_DEPARTMENT_OTHER)
Admission: RE | Admit: 2023-11-11 | Discharge: 2023-11-11 | Disposition: A | Discharge status: Home / Self Care | Source: Ambulatory Visit | Attending: Family Medicine | Admitting: Family Medicine

## 2023-11-11 VITALS — BP 122/84 | HR 98 | Ht 68.0 in | Wt 183.0 lb

## 2023-11-11 DIAGNOSIS — S2232XA Fracture of one rib, left side, initial encounter for closed fracture: Secondary | ICD-10-CM | POA: Insufficient documentation

## 2023-11-11 DIAGNOSIS — Z124 Encounter for screening for malignant neoplasm of cervix: Secondary | ICD-10-CM | POA: Insufficient documentation

## 2023-11-11 DIAGNOSIS — R0789 Other chest pain: Secondary | ICD-10-CM | POA: Diagnosis not present

## 2023-11-11 DIAGNOSIS — N951 Menopausal and female climacteric states: Secondary | ICD-10-CM | POA: Insufficient documentation

## 2023-11-11 DIAGNOSIS — R159 Full incontinence of feces: Secondary | ICD-10-CM

## 2023-11-11 DIAGNOSIS — I10 Essential (primary) hypertension: Secondary | ICD-10-CM | POA: Insufficient documentation

## 2023-11-11 DIAGNOSIS — Z1151 Encounter for screening for human papillomavirus (HPV): Secondary | ICD-10-CM | POA: Diagnosis not present

## 2023-11-11 DIAGNOSIS — Z7984 Long term (current) use of oral hypoglycemic drugs: Secondary | ICD-10-CM

## 2023-11-11 DIAGNOSIS — X58XXXA Exposure to other specified factors, initial encounter: Secondary | ICD-10-CM | POA: Insufficient documentation

## 2023-11-11 DIAGNOSIS — Z23 Encounter for immunization: Secondary | ICD-10-CM | POA: Diagnosis not present

## 2023-11-11 DIAGNOSIS — E119 Type 2 diabetes mellitus without complications: Secondary | ICD-10-CM

## 2023-11-11 DIAGNOSIS — R109 Unspecified abdominal pain: Secondary | ICD-10-CM | POA: Diagnosis not present

## 2023-11-11 LAB — COMPREHENSIVE METABOLIC PANEL WITH GFR
ALT: 22 U/L (ref 0–35)
AST: 18 U/L (ref 0–37)
Albumin: 4.7 g/dL (ref 3.5–5.2)
Alkaline Phosphatase: 51 U/L (ref 39–117)
BUN: 11 mg/dL (ref 6–23)
CO2: 23 meq/L (ref 19–32)
Calcium: 9.4 mg/dL (ref 8.4–10.5)
Chloride: 103 meq/L (ref 96–112)
Creatinine, Ser: 0.5 mg/dL (ref 0.40–1.20)
GFR: 108.74 mL/min (ref >60.00)
Glucose, Bld: 132 mg/dL — ABNORMAL HIGH (ref 70–99)
Potassium: 4.4 meq/L (ref 3.5–5.1)
Sodium: 138 meq/L (ref 135–145)
Total Bilirubin: 0.4 mg/dL (ref 0.2–1.2)
Total Protein: 7.2 g/dL (ref 6.0–8.3)

## 2023-11-11 LAB — CBC
HCT: 38.6 % (ref 36.0–46.0)
Hemoglobin: 12.5 g/dL (ref 12.0–15.0)
MCHC: 32.3 g/dL (ref 30.0–36.0)
MCV: 92.3 fl (ref 78.0–100.0)
Platelets: 250 10*3/uL (ref 150.0–400.0)
RBC: 4.18 Mil/uL (ref 3.87–5.11)
RDW: 13.1 % (ref 11.5–15.5)
WBC: 4.6 10*3/uL (ref 4.0–10.5)

## 2023-11-11 LAB — FOLLICLE STIMULATING HORMONE: FSH: 32.4 m[IU]/mL

## 2023-11-11 LAB — HEMOGLOBIN A1C: Hgb A1c MFr Bld: 6.4 % (ref 4.6–6.5)

## 2023-11-11 LAB — LIPASE: Lipase: 22 U/L (ref 11.0–59.0)

## 2023-11-11 NOTE — Patient Instructions (Addendum)
 It was great to see you again today- I will be in touch with your labs asap Let's also get x-rays of your left lower ribs today If your [facility] is elevated I would like to get you off the Depo!    2nd dose of shingrix today- done with shingles vaccine series

## 2023-11-13 ENCOUNTER — Encounter: Payer: Self-pay | Admitting: Family Medicine

## 2023-11-13 LAB — CYTOLOGY - PAP
Adequacy: ABSENT
Comment: NEGATIVE
Diagnosis: NEGATIVE
High risk HPV: NEGATIVE

## 2023-12-10 ENCOUNTER — Encounter: Payer: Self-pay | Admitting: Obstetrics and Gynecology

## 2023-12-10 ENCOUNTER — Ambulatory Visit: Payer: BC Managed Care – PPO | Admitting: Obstetrics and Gynecology

## 2023-12-10 VITALS — BP 117/71 | HR 96 | Ht 67.0 in | Wt 177.0 lb

## 2023-12-10 DIAGNOSIS — R159 Full incontinence of feces: Secondary | ICD-10-CM

## 2023-12-10 DIAGNOSIS — R350 Frequency of micturition: Secondary | ICD-10-CM

## 2023-12-10 LAB — POCT URINALYSIS DIPSTICK
Bilirubin, UA: NEGATIVE
Blood, UA: NEGATIVE
Glucose, UA: NEGATIVE
Ketones, UA: NEGATIVE
Leukocytes, UA: NEGATIVE
Nitrite, UA: NEGATIVE
Protein, UA: NEGATIVE
Spec Grav, UA: >=1.03 — AB (ref 1.010–1.025)
Urobilinogen, UA: 0.2 U/dL
pH, UA: 5.5 (ref 5.0–8.0)

## 2023-12-10 NOTE — Progress Notes (Signed)
 New Patient Evaluation and Consultation  Referring Provider: Copland, Skipper Dumas, MD PCP: Kaylee Partridge, MD Date of Service: 12/10/2023  SUBJECTIVE Chief Complaint: New Patient (Initial Visit) Lisa [name] is a 53 y.o. female here for a consult for medtronic battery change.)  History of Present Illness: Lisa [name] is a 53 y.o.  female presenting for evaluation of fecal incontinence   Review of records significant for: Dr Burtis Case at [facility] placed interstim in Dec 2019.   Colonoscopy on Dec 27, 2017 demonstrates a fair prep of the colon. Diverticulosis was found in the colon. 1 to 2 mm polyps in the rectum were removed with cold biopsy forceps. Resected and retrieved. Internal hemorrhoids also noted. The examined portion of the ileum is normal. The mucosa from the rectosigmoid colon to the sick cecum was also biopsied. Pathology from her colonoscopy demonstrated small intestinal mucosa with no significant pathologic changes. Large intestine: Without pathologic changes. Negative for colitis. And polyp biopsies were consistent with hyperplastic polyps.  Anorectal manometry Impressions The resting pressure of the IAS is hypertensive. Would correlate for an anal fissure. The EAS is able to generate and maintain adequate squeeze pressure. However 2D and 3D imaging demonstrate mild assymmetry in the right posterior region. Would consider correlation with an anal ultrasound. In 2 out of 3 trials, there if adequate relaxation of the EAS therefore dyssynergia is not present. The patient is also able to expel the balloon during expulsion testing. First sensation is reduced. Would consider sacral neuromodulation in order to treat this patients dyssynergia.  Endoanal ultrasound  ANUS: The internal and external anal sphincters showed no endosonographic abnormalities. . The scope was then completely withdrawn from the patient and the procedure terminated.   Urinary Symptoms: Does not  leak urine.   Day time voids- 3-4.  Nocturia: 0-1 times per night to void. Voiding dysfunction:  empties bladder well.  Patient does not use a catheter to empty bladder.  When urinating, patient feels she has no difficulties  UTIs:  0  UTI's in the last year.   Denies history of blood in urine and kidney or bladder stones No results found for the last 90 days.   Pelvic Organ Prolapse Symptoms:                  Patient Denies a feeling of a bulge the vaginal area.   Bowel Symptom: Bowel movements: 2-6 time(s) per day Stool consistency: soft  Straining: no.  Splinting: no.  Incomplete evacuation: no.  Patient Admits to accidental bowel leakage / fecal incontinence  Occurs: 1-2 time(s) per night, 4-5 times a month. Not having any leakage during the day. None when interstim is working  Consistency with leakage: soft  She reports that in January she noticed the device turned off. It turned back on spontaneously for a day, but since then has not felt any sensation. Then had return of her bowel leakage symptoms.  Bowel regimen: diet and fiber  HM Colonoscopy          Upcoming     Colonoscopy (Every 10 Years) Next due on 12/28/2027    12/27/2017  HM COLONOSCOPY   Only the first 1 history entries have been loaded, but more history exists.                Sexual Function Sexually active: no.  Sexual orientation: Straight Pain with sex: No  Pelvic Pain Denies pelvic pain    Past Medical History:  Past  Medical History:  Diagnosis Date   Anemia    hx of   Anxiety 20 years ago   Depression 20 years ago   Diabetes mellitus without complication (HCC)    type 2   GERD (gastroesophageal reflux disease)    Headache    Heart murmur    in childhood, no problems as an adult   History of blood transfusion 08/21/2022   History of kidney stones    passed stones   HSV (herpes simplex virus) infection    Hyperlipidemia    Hypertension    history of htn, no meds now    IBS (irritable bowel syndrome)    Substance abuse (HCC)    last use in her 30s, marijuana cocaine and crack   Ulcer    x2   Vaginal Pap smear, abnormal      Past Surgical History:   Past Surgical History:  Procedure Laterality Date   ANKLE RECONSTRUCTION Left 04/03/2023   Procedure: LEFT LATERAL ANKLE LIGAMENT RECONSTRUCTION;  Surgeon: Timothy Ford, MD;  Location: [facility] OR;  Service: Orthopedics;  Laterality: Left;   ANTERIOR CRUCIATE LIGAMENT REPAIR Left 09/04/2016   Procedure: LEFT KNEE MEDIAL COLLATERAL LIGAMENT REPAIR, ANTERIOR CRUCIATE LIGAMENT RECONSTRUCTION, MENISCAL DEBRIDEMENT VERSUS REPAIR;  Surgeon: Jasmine Mesi, MD;  Location: [facility] OR;  Service: Orthopedics;  Laterality: Left;   APPENDECTOMY     CHOLECYSTECTOMY N/A 08/20/2022   Procedure: LAPAROSCOPIC CHOLECYSTECTOMY;  Surgeon: Lujean Sake, MD;  Location: [facility] OR;  Service: General;  Laterality: N/A;   ESOPHAGOGASTRODUODENOSCOPY ENDOSCOPY     at least 4 last one 07/2022   JOINT REPLACEMENT     LAPAROSCOPIC APPENDECTOMY N/A 02/23/2015   Procedure: APPENDECTOMY LAPAROSCOPIC;  Surgeon: Aldean Hummingbird, MD;  Location: [facility];  Service: General;  Laterality: N/A;   ORIF ANKLE FRACTURE Right 01/22/2023   Procedure: OPEN REDUCTION INTERNAL FIXATION (ORIF) RIGHT ANKLE FRACTURE;  Surgeon: Jasmine Mesi, MD;  Location: [facility] OR;  Service: Orthopedics;  Laterality: Right;   SACRAL NERVE STIMULATOR PLACEMENT     SPINAL CORD STIMULATOR IMPLANT  2020   Medtronic   TOTAL HIP ARTHROPLASTY Left 02/06/2013   Procedure: LEFT TOTAL HIP ARTHROPLASTY ANTERIOR APPROACH;  Surgeon: Arnie Lao, MD;  Location: [facility];  Service: Orthopedics;  Laterality: Left;     Past OB/GYN History: OB History  Gravida Para Term Preterm AB Living  3 1 1  2 1   SAB IAB Ectopic Multiple Live Births  1    1    # Outcome Date GA Lbr Len/2nd Weight Sex Type Anes PTL Lv  3 Term      Vag-Vacuum     2 AB           1 SAB            Reports no  perineal laceration with delivery.  On depo provera      Component Value Date/Time   DIAGPAP  11/11/2023 0953    - Negative for intraepithelial lesion or malignancy (NILM)   DIAGPAP  11/03/2020 1500    - Negative for intraepithelial lesion or malignancy (NILM)   DIAGPAP (A) 02/14/2017 0000    ATYPICAL SQUAMOUS CELLS OF UNDETERMINED SIGNIFICANCE (ASC-US ).   DIAGPAP (A) 02/14/2017 0000    FUNGAL ORGANISMS PRESENT CONSISTENT WITH CANDIDA SPP.   HPVHIGH Negative 11/11/2023 0953   HPVHIGH Negative 11/03/2020 1500   ADEQPAP  11/11/2023 0953    Satisfactory for evaluation; transformation zone component ABSENT.   ADEQPAP  11/03/2020 1500  Satisfactory for evaluation; transformation zone component PRESENT.   ADEQPAP (A) 02/14/2017 0000    Satisfactory for evaluation  endocervical/transformation zone component PRESENT.    Medications: Patient has a current medication list which includes the following prescription(s): acetaminophen , doxylamine (sleep), latanoprost, lisinopril , medroxyprogesterone , metformin , pantoprazole , polycarbophil, simvastatin , sumatriptan , trazodone , and valacyclovir , and the following Facility-Administered Medications: medroxyprogesterone  and medroxyprogesterone .   Allergies: Patient is allergic to nsaids and nitroglycerin .   Social History:  Social History   Tobacco Use   Smoking status: Former    Current packs/day: 0.00    Average packs/day: 0.3 packs/day for 20.0 years (5.0 ttl pk-yrs)    Types: Cigarettes    Start date: 08/13/1993    Quit date: 08/13/2013    Years since quitting: 10.3   Smokeless tobacco: Never  Vaping Use   Vaping status: Never Used  Substance Use Topics   Alcohol use: Yes    Alcohol/week: 3.0 - 4.0 standard drinks of alcohol    Types: 3 - 4 Glasses of wine per week    Comment: few glasses of wine a week   Drug use: Yes    Types: Marijuana, Cocaine, "Crack" cocaine    Comment: last used back in her 30s    Relationship status:  single Patient lives alone.   Patient is employed [employer]. Regular exercise: No History of abuse: Yes:    Family History:   Family History  Adopted: Yes  Family history unknown: Yes     Review of Systems: Review of Systems  Constitutional:  Negative for fever, malaise/fatigue and weight loss.  Respiratory:  Negative for cough, shortness of breath and wheezing.   Cardiovascular:  Negative for chest pain, palpitations and leg swelling.  Gastrointestinal:  Negative for abdominal pain and blood in stool.  Genitourinary:  Negative for dysuria.  Musculoskeletal:  Negative for myalgias.  Skin:  Negative for rash.  Neurological:  Negative for dizziness and headaches.  Endo/Heme/Allergies:  Bruises/bleeds easily.  Psychiatric/Behavioral:  Negative for depression. The patient is not nervous/anxious.      OBJECTIVE Physical Exam: Vitals:   12/10/23 1005  BP: 117/71  Pulse: 96  Weight: 177 lb (80.3 kg)  Height: 5\' 7"  (1.702 m)    Physical Exam Vitals reviewed. Exam conducted with a chaperone present.  Constitutional:      General: She is not in acute distress. Pulmonary:     Effort: Pulmonary effort is normal.  Abdominal:     General: There is no distension.     Palpations: Abdomen is soft.     Tenderness: There is no abdominal tenderness. There is no rebound.  Musculoskeletal:        General: No swelling. Normal range of motion.  Skin:    General: Skin is warm and dry.     Findings: No rash.  Neurological:     Mental Status: She is alert and oriented to person, place, and time.  Psychiatric:        Mood and Affect: Mood normal.        Behavior: Behavior normal.     GU / Detailed Urogynecologic Evaluation:  Pelvic Exam: Normal external female genitalia; Bartholin's and Skene's glands normal in appearance; urethral meatus normal in appearance, no urethral masses or discharge.   CST: negative  Speculum exam reveals normal vaginal mucosa with  atrophy. Cervix normal appearance. Uterus normal single, nontender. Adnexa no mass, fullness, tenderness.     Pelvic floor strength I/V, puborectalis I/V external anal sphincter I/V  Pelvic  floor musculature: Right levator non-tender, Right obturator non-tender, Left levator non-tender, Left obturator non-tender  POP-Q:  deferred, no prolapse  Rectal Exam:  Normal sphincter tone, no distal rectocele, enterocoele not present, no rectal masses.  Interstim device:  Device is OFF Battery Low Unable to check impedence Was previously set on program 3  Post-Void Residual (PVR) by Bladder Scan: In order to evaluate bladder emptying, we discussed obtaining a postvoid residual and patient agreed to this procedure.  Procedure: The ultrasound unit was placed on the patient's abdomen in the suprapubic region after the patient had voided.    Post Void Residual - 12/10/23 1021       Post Void Residual   Post Void Residual 22 mL              Laboratory Results: Lab Results  Component Value Date   COLORU Yellow 12/10/2023   CLARITYU Clear 12/10/2023   GLUCOSEUR Negative 12/10/2023   BILIRUBINUR Negative 12/10/2023   KETONESU Negative 12/10/2023   SPECGRAV >=1.030 (A) 12/10/2023   RBCUR Negative 12/10/2023   PHUR 5.5 12/10/2023   PROTEINUR Negative 12/10/2023   UROBILINOGEN 0.2 12/10/2023   LEUKOCYTESUR Negative 12/10/2023    Lab Results  Component Value Date   CREATININE 0.50 11/11/2023   CREATININE 0.50 06/13/2023   CREATININE 0.53 04/03/2023    Lab Results  Component Value Date   HGBA1C 6.4 11/11/2023    Lab Results  Component Value Date   HGB 12.5 11/11/2023     ASSESSMENT AND PLAN Ms. Rosebrough is a 53 y.o. with:  1. Incontinence of feces, unspecified fecal incontinence type   2. Urinary frequency    - Will plan for replacement of Interstim battery in the OR.  - We discussed that we can check the wite and if there is an issue, may need replacement at the  same time. But ideally if functioning well, then will only need battery exchange.    General Surgical Risks: For all procedures, there are risks of bleeding, infection, damage to surrounding organs including but not limited to bowel, bladder, blood vessels, ureters and nerves, and need for further surgery if an injury were to occur. These risks are all low with minimally invasive surgery.   There are risks of numbness and weakness at any body site or buttock/rectal pain.  It is possible that baseline pain can be worsened by surgery, either with or without mesh. If surgery is vaginal, there is also a low risk of possible conversion to laparoscopy or open abdominal incision where indicated. Very rare risks include blood transfusion, blood clot, heart attack, pneumonia, or death.   Request sent for surgery scheduling  [name], MD

## 2023-12-16 ENCOUNTER — Encounter: Payer: Self-pay | Admitting: Family Medicine

## 2023-12-16 DIAGNOSIS — E7849 Other hyperlipidemia: Secondary | ICD-10-CM

## 2023-12-16 DIAGNOSIS — K219 Gastro-esophageal reflux disease without esophagitis: Secondary | ICD-10-CM

## 2023-12-16 DIAGNOSIS — F5101 Primary insomnia: Secondary | ICD-10-CM

## 2023-12-16 DIAGNOSIS — I10 Essential (primary) hypertension: Secondary | ICD-10-CM

## 2023-12-16 MED ORDER — PANTOPRAZOLE SODIUM 40 MG PO TBEC
40.0000 mg | DELAYED_RELEASE_TABLET | Freq: Every day | ORAL | 0 refills | Status: DC
Start: 2023-12-16 — End: 2024-04-20

## 2023-12-16 MED ORDER — LISINOPRIL 10 MG PO TABS: 10.0000 mg | ORAL_TABLET | Freq: Every day | ORAL | 2 refills | Status: AC

## 2023-12-16 MED ORDER — SIMVASTATIN 20 MG PO TABS: ORAL_TABLET | ORAL | 3 refills | Status: AC

## 2023-12-16 MED ORDER — TRAZODONE HCL 50 MG PO TABS: ORAL_TABLET | ORAL | 1 refills | Status: DC

## 2023-12-19 ENCOUNTER — Other Ambulatory Visit: Payer: Self-pay | Admitting: Family Medicine

## 2023-12-19 DIAGNOSIS — F5101 Primary insomnia: Secondary | ICD-10-CM

## 2023-12-23 ENCOUNTER — Ambulatory Visit: Payer: BC Managed Care – PPO | Admitting: Family Medicine

## 2023-12-24 ENCOUNTER — Encounter (HOSPITAL_COMMUNITY): Payer: Self-pay | Admitting: Obstetrics and Gynecology

## 2023-12-24 NOTE — Progress Notes (Addendum)
 Spoke w/ via phone for pre-op interview--- Lisa [name] needs dos----  BMP, EKG and CBG per anesthesia. Surgeon orders requested 12/24/23.       [name] results------ Current A1C 6.4 dated 11/11/23. COVID test -----patient states asymptomatic no test needed Arrive at -------1000 NPO after MN NO Solid Food.  Clear liquids from MN until---0900 Pre-Surgery Ensure or G2:  Med rec completed Medications to take morning of surgery -----NONE Diabetic medication -----  GLP1 agonist last dose: GLP1 instructions:  Patient instructed no nail polish to be worn day of surgery Patient instructed to bring photo id and insurance card day of surgery Patient aware to have Driver (ride ) / caregiver    for 24 hours after surgery - Friend Lisa [name] Patient Special Instructions ----- Shower with antibacterial soap. Pre-Op special Instructions -----  Patient verbalized understanding of instructions that were given at this phone interview. Patient denies chest pain, sob, fever, cough at the interview.

## 2023-12-26 NOTE — H&P (Signed)
 [facility] Pre-Operative H&P  Subjective Chief Complaint: Lisa [name] presents for a preoperative encounter.   History of Present Illness: Lisa [name] is a 53 y.o. female who presents for preoperative visit.  She is scheduled to undergo Removal and replacement of implantable pulse generator on 01/01/24.  She has fecal incontinence and had an Interstim sacral nerve stimulator place Dec 2019. Pt reports that in January the device turned off and battery no longer was working.   Past Medical History:  Diagnosis Date   Anemia    hx of   Anxiety 20 years ago   Depression 20 years ago   Diabetes mellitus without complication (HCC)    type 2   GERD (gastroesophageal reflux disease)    Headache    Heart murmur    in childhood, no problems as an adult   History of blood transfusion 08/21/2022   History of kidney stones    passed stones   HSV (herpes simplex virus) infection    Hyperlipidemia    Hypertension    history of htn, no meds now   IBS (irritable bowel syndrome)    Substance abuse (HCC)    last use in her 30s, marijuana cocaine and crack   Ulcer    x2   Vaginal Pap smear, abnormal      Past Surgical History:  Procedure Laterality Date   ANKLE RECONSTRUCTION Left 04/03/2023   Procedure: LEFT LATERAL ANKLE LIGAMENT RECONSTRUCTION;  Surgeon: Timothy Ford, MD;  Location: [facility] OR;  Service: Orthopedics;  Laterality: Left;   ANTERIOR CRUCIATE LIGAMENT REPAIR Left 09/04/2016   Procedure: LEFT KNEE MEDIAL COLLATERAL LIGAMENT REPAIR, ANTERIOR CRUCIATE LIGAMENT RECONSTRUCTION, MENISCAL DEBRIDEMENT VERSUS REPAIR;  Surgeon: Jasmine Mesi, MD;  Location: [facility] OR;  Service: Orthopedics;  Laterality: Left;   APPENDECTOMY     CHOLECYSTECTOMY N/A 08/20/2022   Procedure: LAPAROSCOPIC CHOLECYSTECTOMY;  Surgeon: Lujean Sake, MD;  Location: [facility] OR;  Service: General;  Laterality: N/A;   ESOPHAGOGASTRODUODENOSCOPY ENDOSCOPY     at least 4 last one 07/2022   JOINT  REPLACEMENT     LAPAROSCOPIC APPENDECTOMY N/A 02/23/2015   Procedure: APPENDECTOMY LAPAROSCOPIC;  Surgeon: Aldean Hummingbird, MD;  Location: [facility] ORS;  Service: General;  Laterality: N/A;   ORIF ANKLE FRACTURE Right 01/22/2023   Procedure: OPEN REDUCTION INTERNAL FIXATION (ORIF) RIGHT ANKLE FRACTURE;  Surgeon: Jasmine Mesi, MD;  Location: [facility] OR;  Service: Orthopedics;  Laterality: Right;   SACRAL NERVE STIMULATOR PLACEMENT     SPINAL CORD STIMULATOR IMPLANT  2020   [employer]tronic   TOTAL HIP ARTHROPLASTY Left 02/06/2013   Procedure: LEFT TOTAL HIP ARTHROPLASTY ANTERIOR APPROACH;  Surgeon: Arnie Lao, MD;  Location: [facility];  Service: Orthopedics;  Laterality: Left;    is allergic to nsaids and nitroglycerin .   Family History  Adopted: Yes  Family history unknown: Yes    Social History   Tobacco Use   Smoking status: Former    Current packs/day: 0.00    Average packs/day: 0.3 packs/day for 20.0 years (5.0 ttl pk-yrs)    Types: Cigarettes    Start date: 08/13/1993    Quit date: 08/13/2013    Years since quitting: 10.3   Smokeless tobacco: Never  Vaping Use   Vaping status: Never Used  Substance Use Topics   Alcohol use: Yes    Alcohol/week: 3.0 - 4.0 standard drinks of alcohol    Types: 3 - 4 Glasses of wine per week    Comment: few  glasses of wine a week   Drug use: Not Currently    Types: Marijuana, Cocaine, "Crack" cocaine    Comment: last used back in her 30s     Review of Systems was negative for a full 10 system review except as noted in the History of Present Illness.   Current Facility-Administered Medications:    medroxyPROGESTERone  (DEPO-PROVERA ) injection 150 mg, 150 mg, Intramuscular, Q90 days, Copland, Jessica C, MD, 150 mg at 12/19/22 1551   medroxyPROGESTERone  (DEPO-PROVERA ) injection 150 mg, 150 mg, Intramuscular, Q90 days, Copland, Jessica C, MD, 150 mg at 08/30/23 1610  Current Outpatient Medications:    acetaminophen  (TYLENOL ) 500 MG tablet, Take 1  tablet (500 mg total) by mouth every 6 (six) hours as needed for mild pain. (Patient taking differently: Take 1,000 mg by mouth every 6 (six) hours as needed for mild pain (pain score 1-3).), Disp: 30 tablet, Rfl: 0   cyanocobalamin  (VITAMIN B12) 1000 MCG tablet, Take 1,000 mcg by mouth 2 (two) times a week., Disp: , Rfl:    doxylamine, Sleep, (UNISOM) 25 MG tablet, Take 25-50 mg by mouth at bedtime as needed for sleep., Disp: , Rfl:    latanoprost (XALATAN) 0.005 % ophthalmic solution, Place 1 drop into both eyes at bedtime., Disp: , Rfl:    lisinopril  (ZESTRIL ) 10 MG tablet, Take 1 tablet (10 mg total) by mouth daily., Disp: 90 tablet, Rfl: 2   metFORMIN  (GLUCOPHAGE ) 500 MG tablet, Take 1 tablet (500 mg total) by mouth daily., Disp: 90 tablet, Rfl: 3   Multiple Vitamins-Minerals (MULTIVITAMIN WITH MINERALS) tablet, Take 1 tablet by mouth daily., Disp: , Rfl:    pantoprazole  (PROTONIX ) 40 MG tablet, Take 1 tablet (40 mg total) by mouth daily., Disp: 90 tablet, Rfl: 0   polycarbophil (FIBERCON) 625 MG tablet, Take 1,875 mg by mouth every evening., Disp: , Rfl:    simvastatin  (ZOCOR ) 20 MG tablet, TAKE 1 TABLET BY MOUTH EVERY DAY IN THE EVENING, Disp: 90 tablet, Rfl: 3   SUMAtriptan  (IMITREX ) 50 MG tablet, TAKE 1 TABLET BY MOUTH EVERY 2 (TWO) HOURS AS NEEDED FOR MIGRAINE. MAX 100 MG IN 24 HOURS, Disp: 9 tablet, Rfl: 0   traZODone  (DESYREL ) 50 MG tablet, Take 3 tablets (150 mg total) by mouth at bedtime as needed for sleep. (Patient taking differently: Take 150 mg by mouth at bedtime.), Disp: 270 tablet, Rfl: 1   valACYclovir  (VALTREX ) 1000 MG tablet, TAKE 1 TABLET BY MOUTH EVERY DAY (Patient taking differently: Take 1,000 mg by mouth daily as needed (break outs).), Disp: 30 tablet, Rfl: 5   Objective There were no vitals filed for this visit.  Gen: NAD CV: S1 S2 RRR Lungs: Clear to auscultation bilaterally Abd: soft, nontender  12/10/23: Rectal Exam:  Normal sphincter tone, no distal rectocele,  enterocoele not present, no rectal masses.   Interstim device:  Device is OFF Battery Low Unable to check impedence Was previously set on program 3    Assessment/ Plan  Assessment: The patient is a 53 y.o. year old with fecal incontinence and Interstim sacral neuromodulator scheduled to undergo Removal and replacement of implantable pulse generator.   [name], MD

## 2023-12-31 MED ORDER — GENTAMICIN SULFATE 40 MG/ML IJ SOLN
3.0000 mg/kg | INTRAVENOUS | Status: AC
  Administered 2024-01-01: 240 mg via INTRAVENOUS
  Filled 2023-12-31: qty 6

## 2024-01-01 ENCOUNTER — Other Ambulatory Visit: Payer: Self-pay

## 2024-01-01 ENCOUNTER — Ambulatory Visit (HOSPITAL_COMMUNITY)
Admission: RE | Admit: 2024-01-01 | Discharge: 2024-01-01 | Disposition: A | Discharge status: Home / Self Care | Attending: Obstetrics and Gynecology | Admitting: Obstetrics and Gynecology

## 2024-01-01 ENCOUNTER — Ambulatory Visit (HOSPITAL_COMMUNITY): Admitting: Anesthesiology

## 2024-01-01 ENCOUNTER — Ambulatory Visit (HOSPITAL_COMMUNITY)

## 2024-01-01 ENCOUNTER — Encounter (HOSPITAL_COMMUNITY): Payer: Self-pay | Admitting: Obstetrics and Gynecology

## 2024-01-01 ENCOUNTER — Encounter (HOSPITAL_COMMUNITY)
Admission: RE | Disposition: A | Discharge status: Home / Self Care | Payer: Self-pay | Source: Home / Self Care | Attending: Obstetrics and Gynecology

## 2024-01-01 DIAGNOSIS — Z79624 Long term (current) use of inhibitors of nucleotide synthesis: Secondary | ICD-10-CM | POA: Diagnosis not present

## 2024-01-01 DIAGNOSIS — I10 Essential (primary) hypertension: Secondary | ICD-10-CM | POA: Insufficient documentation

## 2024-01-01 DIAGNOSIS — Z793 Long term (current) use of hormonal contraceptives: Secondary | ICD-10-CM | POA: Diagnosis not present

## 2024-01-01 DIAGNOSIS — F418 Other specified anxiety disorders: Secondary | ICD-10-CM | POA: Diagnosis not present

## 2024-01-01 DIAGNOSIS — M1990 Unspecified osteoarthritis, unspecified site: Secondary | ICD-10-CM | POA: Diagnosis not present

## 2024-01-01 DIAGNOSIS — Z87891 Personal history of nicotine dependence: Secondary | ICD-10-CM | POA: Diagnosis not present

## 2024-01-01 DIAGNOSIS — E119 Type 2 diabetes mellitus without complications: Secondary | ICD-10-CM | POA: Insufficient documentation

## 2024-01-01 DIAGNOSIS — Z7984 Long term (current) use of oral hypoglycemic drugs: Secondary | ICD-10-CM | POA: Diagnosis not present

## 2024-01-01 DIAGNOSIS — E785 Hyperlipidemia, unspecified: Secondary | ICD-10-CM | POA: Diagnosis not present

## 2024-01-01 DIAGNOSIS — D649 Anemia, unspecified: Secondary | ICD-10-CM | POA: Insufficient documentation

## 2024-01-01 DIAGNOSIS — F419 Anxiety disorder, unspecified: Secondary | ICD-10-CM | POA: Diagnosis not present

## 2024-01-01 DIAGNOSIS — F32A Depression, unspecified: Secondary | ICD-10-CM | POA: Insufficient documentation

## 2024-01-01 DIAGNOSIS — T85113A Breakdown (mechanical) of implanted electronic neurostimulator, generator, initial encounter: Secondary | ICD-10-CM | POA: Diagnosis not present

## 2024-01-01 DIAGNOSIS — R159 Full incontinence of feces: Secondary | ICD-10-CM

## 2024-01-01 DIAGNOSIS — X58XXXA Exposure to other specified factors, initial encounter: Secondary | ICD-10-CM | POA: Diagnosis not present

## 2024-01-01 DIAGNOSIS — Z79899 Other long term (current) drug therapy: Secondary | ICD-10-CM | POA: Insufficient documentation

## 2024-01-01 DIAGNOSIS — K219 Gastro-esophageal reflux disease without esophagitis: Secondary | ICD-10-CM | POA: Diagnosis not present

## 2024-01-01 DIAGNOSIS — R519 Headache, unspecified: Secondary | ICD-10-CM | POA: Insufficient documentation

## 2024-01-01 HISTORY — PX: INTERSTIM IMPLANT PLACEMENT: SHX5130

## 2024-01-01 LAB — GLUCOSE, CAPILLARY
Glucose-Capillary: 111 mg/dL — ABNORMAL HIGH (ref 70–99)
Glucose-Capillary: 111 mg/dL — ABNORMAL HIGH (ref 70–99)

## 2024-01-01 LAB — BASIC METABOLIC PANEL WITH GFR
Anion gap: 9 (ref 5–15)
BUN: 11 mg/dL (ref 6–20)
CO2: 21 mmol/L — ABNORMAL LOW (ref 22–32)
Calcium: 9 mg/dL (ref 8.9–10.3)
Chloride: 107 mmol/L (ref 98–111)
Creatinine, Ser: 0.48 mg/dL (ref 0.44–1.00)
GFR, Estimated: >60 mL/min (ref >60)
Glucose, Bld: 111 mg/dL — ABNORMAL HIGH (ref 70–99)
Potassium: 3.4 mmol/L — ABNORMAL LOW (ref 3.5–5.1)
Sodium: 137 mmol/L (ref 135–145)

## 2024-01-01 SURGERY — INSERTION, SACRAL NERVE STIMULATOR, INTERSTIM, STAGE 2
Anesthesia: Monitor Anesthesia Care | Site: Back

## 2024-01-01 MED ORDER — CHLORHEXIDINE GLUCONATE 0.12 % MT SOLN
OROMUCOSAL | Status: AC
  Filled 2024-01-01: qty 15

## 2024-01-01 MED ORDER — LIDOCAINE 2% (20 MG/ML) 5 ML SYRINGE
INTRAMUSCULAR | Status: DC | PRN
  Administered 2024-01-01: 60 mg via INTRAVENOUS

## 2024-01-01 MED ORDER — MIDAZOLAM HCL 5 MG/5ML IJ SOLN
INTRAMUSCULAR | Status: DC | PRN
  Administered 2024-01-01: 2 mg via INTRAVENOUS

## 2024-01-01 MED ORDER — PROPOFOL 1000 MG/100ML IV EMUL
INTRAVENOUS | Status: AC
  Filled 2024-01-01: qty 100

## 2024-01-01 MED ORDER — INSULIN ASPART 100 UNIT/ML IJ SOLN: 0.0000 [IU] | INTRAMUSCULAR | Status: DC | PRN

## 2024-01-01 MED ORDER — FENTANYL CITRATE (PF) 100 MCG/2ML IJ SOLN
25.0000 ug | INTRAMUSCULAR | Status: DC | PRN
  Administered 2024-01-01 (×2): 25 ug via INTRAVENOUS

## 2024-01-01 MED ORDER — BUPIVACAINE HCL 0.25 % IJ SOLN
INTRAMUSCULAR | Status: DC | PRN
  Administered 2024-01-01: 28 mL

## 2024-01-01 MED ORDER — LACTATED RINGERS IV SOLN: INTRAVENOUS | Status: DC

## 2024-01-01 MED ORDER — CEFAZOLIN SODIUM-DEXTROSE 2-4 GM/100ML-% IV SOLN
INTRAVENOUS | Status: AC
Start: 2024-01-01 — End: 2024-01-01
  Filled 2024-01-01: qty 100

## 2024-01-01 MED ORDER — LIDOCAINE 2% (20 MG/ML) 5 ML SYRINGE
INTRAMUSCULAR | Status: AC
  Filled 2024-01-01: qty 5

## 2024-01-01 MED ORDER — WATER FOR IRRIGATION, STERILE IR SOLN
Status: DC | PRN
  Administered 2024-01-01: 1000 mL

## 2024-01-01 MED ORDER — OXYCODONE HCL 5 MG/5ML PO SOLN: 5.0000 mg | Freq: Once | ORAL | Status: DC | PRN

## 2024-01-01 MED ORDER — FENTANYL CITRATE (PF) 250 MCG/5ML IJ SOLN
INTRAMUSCULAR | Status: DC | PRN
Start: 2024-01-01 — End: 2024-01-01
  Administered 2024-01-01 (×5): 50 ug via INTRAVENOUS

## 2024-01-01 MED ORDER — ORAL CARE MOUTH RINSE: 15.0000 mL | Freq: Once | OROMUCOSAL | Status: AC

## 2024-01-01 MED ORDER — MIDAZOLAM HCL 2 MG/2ML IJ SOLN
INTRAMUSCULAR | Status: AC
  Filled 2024-01-01: qty 2

## 2024-01-01 MED ORDER — PROPOFOL 10 MG/ML IV BOLUS
INTRAVENOUS | Status: AC
  Filled 2024-01-01: qty 20

## 2024-01-01 MED ORDER — FENTANYL CITRATE (PF) 250 MCG/5ML IJ SOLN
INTRAMUSCULAR | Status: AC
  Filled 2024-01-01: qty 5

## 2024-01-01 MED ORDER — FENTANYL CITRATE (PF) 100 MCG/2ML IJ SOLN
INTRAMUSCULAR | Status: AC
  Filled 2024-01-01: qty 2

## 2024-01-01 MED ORDER — DEXMEDETOMIDINE HCL IN NACL 80 MCG/20ML IV SOLN
INTRAVENOUS | Status: DC | PRN
Start: 2024-01-01 — End: 2024-01-01
  Administered 2024-01-01: 12 ug via INTRAVENOUS

## 2024-01-01 MED ORDER — POVIDONE-IODINE 10 % EX SWAB
2.0000 | Freq: Once | CUTANEOUS | Status: DC
Start: 2024-01-01 — End: 2024-01-01

## 2024-01-01 MED ORDER — CHLORHEXIDINE GLUCONATE 0.12 % MT SOLN
15.0000 mL | Freq: Once | OROMUCOSAL | Status: AC
  Administered 2024-01-01: 15 mL via OROMUCOSAL

## 2024-01-01 MED ORDER — ONDANSETRON HCL 4 MG/2ML IJ SOLN
INTRAMUSCULAR | Status: DC | PRN
  Administered 2024-01-01: 4 mg via INTRAVENOUS

## 2024-01-01 MED ORDER — PROPOFOL 10 MG/ML IV BOLUS
INTRAVENOUS | Status: DC | PRN
Start: 2024-01-01 — End: 2024-01-01
  Administered 2024-01-01: 30 mg via INTRAVENOUS
  Administered 2024-01-01: 20 mg via INTRAVENOUS
  Administered 2024-01-01: 60 mg via INTRAVENOUS

## 2024-01-01 MED ORDER — PROPOFOL 500 MG/50ML IV EMUL
INTRAVENOUS | Status: DC | PRN
  Administered 2024-01-01: 125 ug/kg/min via INTRAVENOUS

## 2024-01-01 MED ORDER — BUPIVACAINE HCL (PF) 0.25 % IJ SOLN
INTRAMUSCULAR | Status: AC
  Filled 2024-01-01: qty 30

## 2024-01-01 MED ORDER — OXYCODONE HCL 5 MG PO TABS: 5.0000 mg | ORAL_TABLET | ORAL | 0 refills | Status: AC | PRN

## 2024-01-01 MED ORDER — OXYCODONE HCL 5 MG PO TABS: 5.0000 mg | ORAL_TABLET | Freq: Once | ORAL | Status: DC | PRN

## 2024-01-01 MED ORDER — CEFAZOLIN SODIUM-DEXTROSE 2-4 GM/100ML-% IV SOLN
2.0000 g | INTRAVENOUS | Status: AC
  Administered 2024-01-01: 2 g via INTRAVENOUS

## 2024-01-01 MED ORDER — ACETAMINOPHEN 500 MG PO TABS: 500.0000 mg | ORAL_TABLET | Freq: Four times a day (QID) | ORAL | 0 refills | Status: AC | PRN

## 2024-01-01 SURGICAL SUPPLY — 51 items
BLADE SURG 11 STRL SS (BLADE) ×2 IMPLANT
BLADE SURG 15 STRL LF DISP TIS (BLADE) ×2 IMPLANT
CABLE EXTEN 4.32 INTERSTIM (NEUROSURGERY SUPPLIES) IMPLANT
CABLE NEUROSTIMULATOR GROUND (NEUROSURGERY SUPPLIES) IMPLANT
CHLORAPREP W/TINT 26 (MISCELLANEOUS) ×2 IMPLANT
COVER BACK TABLE 60X90IN (DRAPES) ×2 IMPLANT
COVER MAYO STAND STRL (DRAPES) ×2 IMPLANT
COVER PROBE CYLINDRICAL 5X96 (MISCELLANEOUS) IMPLANT
COVER SURGICAL LIGHT HANDLE (MISCELLANEOUS) IMPLANT
DERMABOND ADVANCED .7 DNX12 (GAUZE/BANDAGES/DRESSINGS) IMPLANT
DERMABOND ADVANCED .7 DNX6 (GAUZE/BANDAGES/DRESSINGS) IMPLANT
DRAPE C-ARM 42X120 X-RAY (DRAPES) ×2 IMPLANT
DRAPE LAPAROSCOPIC ABDOMINAL (DRAPES) ×2 IMPLANT
DRAPE SHEET LG 3/4 BI-LAMINATE (DRAPES) ×2 IMPLANT
DRAPE UTILITY XL STRL (DRAPES) ×2 IMPLANT
DRSG TEGADERM 4X4.75 (GAUZE/BANDAGES/DRESSINGS) IMPLANT
DRSG TEGADERM 8X12 (GAUZE/BANDAGES/DRESSINGS) IMPLANT
DRSG TELFA 3X8 NADH STRL (GAUZE/BANDAGES/DRESSINGS) IMPLANT
ELECTRODE REM PT RTRN 9FT ADLT (ELECTROSURGICAL) ×2 IMPLANT
ENVELOPE ABSORB ANTIBACTERIAL (Mesh General) ×1 IMPLANT
EXTENSION NEURO PERCUTANEOUS (NEUROSURGERY SUPPLIES) IMPLANT
GAUZE 4X4 16PLY ~~LOC~~+RFID DBL (SPONGE) ×2 IMPLANT
GLOVE BIOGEL PI IND STRL 6.5 (GLOVE) ×2 IMPLANT
GLOVE ECLIPSE 6.0 STRL STRAW (GLOVE) ×2 IMPLANT
GOWN STRL REUS W/ TWL LRG LVL3 (GOWN DISPOSABLE) ×2 IMPLANT
KIT BASIN OR (CUSTOM PROCEDURE TRAY) ×2 IMPLANT
KIT HANDSET INTERSTIM COMM (NEUROSURGERY SUPPLIES) IMPLANT
KIT LEAD TINED W/2 NDL GUIDE (NEUROSURGERY SUPPLIES) IMPLANT
LEAD INTERSTIM 4.32 28 L (Lead) IMPLANT
NDL FORAMN 5 20GA (NEUROSURGERY SUPPLIES) IMPLANT
NDL HYPO 22X1.5 SAFETY MO (MISCELLANEOUS) ×2 IMPLANT
NEEDLE FORAMN 5 20GA (NEUROSURGERY SUPPLIES) IMPLANT
NEEDLE HYPO 22X1.5 SAFETY MO (MISCELLANEOUS) ×1 IMPLANT
NEUROSTIMULATOR 1.7X2X.06 (UROLOGICAL SUPPLIES) IMPLANT
PENCIL SMOKE EVACUATOR (MISCELLANEOUS) ×2 IMPLANT
POUCH TYRX ANTIBAC NEURO MED (Mesh General) IMPLANT
REMOTE CONTROL NEURO PATIENT (NEUROSURGERY SUPPLIES) IMPLANT
SLEEVE SCD COMPRESS KNEE MED (STOCKING) ×2 IMPLANT
STIMULATOR EXTERNAL TRIAL (NEUROSURGERY SUPPLIES) IMPLANT
STIMULATOR INTERSTIM 2X1.7X.3 (Miscellaneous) IMPLANT
SUT MNCRL AB 4-0 PS2 18 (SUTURE) IMPLANT
SUT SILK 2 0 SH (SUTURE) IMPLANT
SUT VIC AB 2-0 SH 27XBRD (SUTURE) IMPLANT
SYR BULB IRRIG 60ML STRL (SYRINGE) ×2 IMPLANT
SYR CONTROL 10ML LL (SYRINGE) ×2 IMPLANT
TOWEL GREEN STERILE FF (TOWEL DISPOSABLE) ×2 IMPLANT
TUBE CONNECTING 12X1/4 (SUCTIONS) IMPLANT
UNDERPAD 30X36 HEAVY ABSORB (UNDERPADS AND DIAPERS) ×2 IMPLANT
WATER STERILE IRR 1000ML POUR (IV SOLUTION) IMPLANT
WATER STERILE IRR 500ML POUR (IV SOLUTION) ×2 IMPLANT
YANKAUER SUCT BULB TIP NO VENT (SUCTIONS) ×2 IMPLANT

## 2024-01-01 NOTE — Transfer of Care (Addendum)
 Immediate Anesthesia Transfer of Care Note  Patient: Lisa [name]  Procedure(s) Performed: REMOVAL AND REPLACEMENT OF IMPLANTABLE PULSE GENERATOR/ PLACEMENT OF SACRAL NERVE STIMULATOR, REMOVAL OF PREVIOUSLY PLACED WIRE, STAGE 2 (Back)  Patient Location: PACU  Anesthesia Type:MAC  Level of Consciousness: awake, alert , oriented, and patient cooperative  Airway & Oxygen Therapy: Patient Spontanous Breathing  Post-op Assessment: Report given to RN and Post -op Vital signs reviewed and stable, MAE x 4  Post vital signs: Reviewed and stable  Last Vitals:  Vitals Value Taken Time  BP 134/97 01/01/24 1355  Temp    Pulse 90 01/01/24 1359  Resp 19 01/01/24 1359  SpO2 95 % 01/01/24 1359  Vitals shown include unfiled device data.  Last Pain:  Vitals:   01/01/24 1008  TempSrc: Oral  PainSc: 0-No pain      Patients Stated Pain Goal: 5 (01/01/24 1008)  Complications: No notable events documented.

## 2024-01-01 NOTE — Discharge Instructions (Addendum)
 POST OPERATIVE SACRAL NEUROMODULATION INSTRUCTIONS  General Instructions Recovery (not bed rest) will last approximately 1 weeks Walking is encouraged, but refrain from strenuous exercise/ housework/ heavy lifting. Bathing:  Do not submerge in water  (NO swimming, bath, hot tub, etc) until after your postop visit.  You may shower the day after the procedure.  You can wash with soap and water  but do not scrub the incision.  No driving until you are not taking narcotic pain medicine and until your pain is well enough controlled that you can slam on the breaks or make sudden movements if needed.   Taking your medications Please take your acetaminophen  and ibuprofen on a schedule for the first 48 hours. Take 600mg  ibuprofen, then take 500mg  acetaminophen  3 hours later, then continue to alternate ibuprofen and acetaminophen . That way you are taking each type of medication every 6 hours. Take the prescribed narcotic (oxycodone , tramadol , etc) as needed, with a maximum being every 4 hours.    Reasons to Call the Nurse (see last page for phone numbers) Persistent nausea/vomiting Fever (100.4 degrees or more) Incision problems (pus, blood or other fluid coming out, redness, warmth, increased pain)  Things to Expect After Surgery Mild to Moderate pain is normal during the first day or two after surgery. If prescribed, take Ibuprofen or Tylenol  first and use the stronger medicine for "break-through" pain. You can overlap these medicines because they work differently.   Constipation   To Prevent Constipation:  Eat a well-balanced diet including protein, grains, fresh fruit and vegetables.  Drink plenty of fluids. Walk regularly.  Depending on specific instructions from your physician: take Miralax  daily and additionally you can add a stool softener (colace/ docusate) and fiber supplement. Continue as long as you're on pain medications.   To Treat Constipation:  If you do not have a bowel movement in 2  days after surgery, you can take 2 Tbs of Milk of Magnesia 1-2 times a day until you have a bowel movement. If diarrhea occurs, decrease the amount or stop the laxative. If no results with Milk of Magnesia, you can drink a bottle of magnesium citrate which you can purchase over the counter.  Fatigue:  This is a normal response to surgery and will improve with time.  Plan frequent rest periods throughout the day.   Return to Work  As work demands and recovery times vary widely, it is hard to predict when you will want to return to work. If you have a desk job with no strenuous physical activity, and if you would like to return sooner than generally recommended, discuss this with your provider or call our office.   Post op concerns  For non-emergent issues, please call the Urogynecology Nurse. Please leave a message and someone will contact you within one business day.  You can also send a message through MyChart.   AFTER HOURS (After 5:00 PM and on weekends):  For urgent matters that cannot wait until the next business day. Call our office [phone] and connect to the doctor on call.  Please reserve this for important issues.   **FOR ANY TRUE EMERGENCY ISSUES CALL 911 OR GO TO THE NEAREST EMERGENCY ROOM.** Please inform our office or the doctor on call of any emergency.     APPOINTMENTS: Call [phone]    Post Anesthesia Home Care Instructions  Activity: Get plenty of rest for the remainder of the day. A responsible individual must stay with you for 24 hours following the procedure.  For  the next 24 hours, DO NOT: -Drive a car -Advertising copywriter -Drink alcoholic beverages -Take any medication unless instructed by your physician -Make any legal decisions or sign important papers.  Meals: Start with liquid foods such as gelatin or soup. Progress to regular foods as tolerated. Avoid greasy, spicy, heavy foods. If nausea and/or vomiting occur, drink only clear liquids until the  nausea and/or vomiting subsides. Call your physician if vomiting continues.  Special Instructions/Symptoms: Your throat may feel dry or sore from the anesthesia or the breathing tube placed in your throat during surgery. If this causes discomfort, gargle with warm salt water . The discomfort should disappear within 24 hours.

## 2024-01-01 NOTE — Anesthesia Preprocedure Evaluation (Signed)
 Anesthesia Evaluation  Patient identified by MRN, date of birth, ID band Patient awake    Reviewed: Allergy & Precautions, NPO status , Patient's Chart, lab work & pertinent test results  Airway Mallampati: II  TM Distance: >3 FB Neck ROM: Full    Dental no notable dental hx.    Pulmonary neg pulmonary ROS, former smoker   Pulmonary exam normal        Cardiovascular hypertension, Pt. on medications  Rhythm:Regular Rate:Normal     Neuro/Psych  Headaches  Anxiety Depression       GI/Hepatic Neg liver ROS,GERD  Medicated,,  Endo/Other  diabetes, Type 2    Renal/GU negative Renal ROS  Female GU complaint     Musculoskeletal  (+) Arthritis ,    Abdominal Normal abdominal exam  (+)   Peds  Hematology  (+) Blood dyscrasia, anemia   Anesthesia Other Findings   Reproductive/Obstetrics                             Anesthesia Physical Anesthesia Plan  ASA: 2  Anesthesia Plan: MAC   Post-op Pain Management: Celebrex PO (pre-op)* and Tylenol  PO (pre-op)*   Induction: Intravenous  PONV Risk Score and Plan: 2 and Ondansetron , Dexamethasone , Midazolam  and Treatment may vary due to age or medical condition  Airway Management Planned: Simple Face Mask and Nasal Cannula  Additional Equipment: None  Intra-op Plan:   Post-operative Plan:   Informed Consent: I have reviewed the patients History and Physical, chart, labs and discussed the procedure including the risks, benefits and alternatives for the proposed anesthesia with the patient or authorized representative who has indicated his/her understanding and acceptance.     Dental advisory given  Plan Discussed with: CRNA  Anesthesia Plan Comments:        Anesthesia Quick Evaluation

## 2024-01-01 NOTE — Op Note (Signed)
 Operative Note  Preoperative Diagnosis: Fecal incontinence  Postoperative Diagnosis: same  Procedures performed:  Removal and replacement of intersim sacral nerve stimulator and implantable pulse generator with fluoroscopic guidance and interpretation  Implants:  Implant Name Type Inv. Item Serial No. Manufacturer Lot No. LRB No. Used Action  LEAD INTERSTIM 4.32 28 L - [id number] Lead LEAD INTERSTIM 4.32 28 L  MEDTRONIC NEUROMOD PAIN MGMT [id number] Left 1 Implanted  STIMULATOR INTERSTIM 2X1.7X.3 - [id number] H Miscellaneous STIMULATOR INTERSTIM 2X1.7X.3 [id number] H MEDTRONIC NEUROMOD PAIN MGMT  Left 1 Implanted  ENVELOPE ABSORB ANTIBACTERIAL - [id number] Mesh General ENVELOPE ABSORB ANTIBACTERIAL  MEDTRONIC NEUROMOD PAIN MGMT [id number] Left 1 Implanted    Attending Surgeon: Ollen Beverage, MD  Anesthesia: MAC  Findings: Lead noted on the right sacrum with battery in the left buttock. Lead placed in S4 on the right with battery in the same pocket on the left.  Specimens: * No specimens in log *  Estimated blood loss: 15 mL  IV fluids: 700 mL  Urine output:not measure  Complications: none  Procedure in Detail:  After informed consent was obtained, the patient was taken to the operating room where anesthesia was induced and found to be adequate. Patient was placed in the prone position with adequate padding to flatten the sacrum and under the shins to allow the toes to dangle freely. She was then prepped and draped in the usual sterile fashion. The C-arm was positioned in the AP and lateral positions to provide fluoroscopic mapping of the sacral region. Initial images were obtained to confirm location of previously placed stimulator and generator. 0.25% bupivacaine  was injected over the prior pocket site on the left and over the lead. An incision was made with a scalpel over the pocket and incision was carried down to the generator. The generator was easily removed. An incision was made  over the location of the lead in the right sacrum. Dissection was performed with a hemostat until the lead was palpated. The lead was pulled through the pocket to the insertion site. Using a hemostat at its base, the lead was gently pulled cephalad and removed from the sacrum intact.   Spinal needles in laid in parallel were used to identify the foramen landmarks.The S3 foramen was identified. Local injection of 0.25% bupivacaine  was injected down each side to the level of the sacrum. The foramen needle was placed at a 60 degree angle into the S4 foramen on the right. Proper needle depth was confirmed with fluoroscopy.  Position was confirmed by direct observation of bellowing. The needle was then placed in S3 on the right. Proper needle position and depth was confirmed with fluoroscopy. Direct observation of plantar flexion of the toe and bellows was noted utilizing the external test stimulator, however this was a less optimal response than in S4. Therefore, the S3 needle was removed and we continued with placement in S4.    The foramen needle stylet was removed and guidewire was placed. The introducer was placed over the guidewire. Proper depth was confirmed with fluoroscopy until the opaque mark of the dilator was seen on the anterior rim of the sacrum with fluoroscopy.  The obturator was unlocked and removed and the lead was placed through the introducer sheath to the first white line.  Position was checked fluoroscopically.  The lead was then introduced until three electrodes were visible below the sacrum.  Each electrode was tested for visualization of the bellows.  After satisfactory positioning was confirmed under fluoroscopy in lateral and AP views,  the introducer sheath was retracted deploying the lead ties into the perisacral tissues under live fluoroscopy.     A tunneling tool and tube were placed from the sacral lead subcutaneously to the incised pocket site.  The tunneling tool was removed and  the lead was sent through the tube and pulled out at the pocket site. The pocket was noted to be hemostatic. The generator was connected and placed in the pocket in a tyrx pouch. It was noted to have adequate room but also stayed in place. The incision was inspected and noted to be hemostatic.  The subcutaneous layer was closed with running 2-0 Vicryl. The skin was closed with a subcuticular stitch of 4-0 monocryl. The incision from the neuroelectrode was closed in a similar fashion. Skin glue was placed over the incision. The patient tolerated the procedure well and was taken to the recovery room in stable condition. Needle and sponge count was correct x2.    [name], MD

## 2024-01-01 NOTE — Anesthesia Procedure Notes (Signed)
 Procedure Name: MAC Date/Time: 01/01/2024 12:27 PM  Performed by: Ezzie Holstein, CRNAPre-anesthesia Checklist: Patient identified, Emergency Drugs available, Suction available and Patient being monitored Patient Re-evaluated:Patient Re-evaluated prior to induction Oxygen Delivery Method: Simple face mask Ventilation: Nasal airway inserted- appropriate to patient size Airway Equipment and Method: Bite block Placement Confirmation: positive ETCO2 Dental Injury: Teeth and Oropharynx as per pre-operative assessment  Comments: 8.0 NPA inserted to L nare

## 2024-01-01 NOTE — Anesthesia Postprocedure Evaluation (Signed)
 Anesthesia Post Note  Patient: Lisa [name]  Procedure(s) Performed: REMOVAL AND REPLACEMENT OF IMPLANTABLE PULSE GENERATOR/ PLACEMENT OF SACRAL NERVE STIMULATOR, REMOVAL OF PREVIOUSLY PLACED WIRE, STAGE 2 (Back)     Patient location during evaluation: PACU Anesthesia Type: MAC Level of consciousness: awake and alert Pain management: pain level controlled Vital Signs Assessment: post-procedure vital signs reviewed and stable Respiratory status: spontaneous breathing, nonlabored ventilation, respiratory function stable and patient connected to nasal cannula oxygen Cardiovascular status: stable and blood pressure returned to baseline Postop Assessment: no apparent nausea or vomiting Anesthetic complications: no   No notable events documented.  Last Vitals:  Vitals:   01/01/24 1418 01/01/24 1430  BP:  (!) 131/98  Pulse: 84 88  Resp: 18 10  Temp:  36.6 C  SpO2: 96% 96%    Last Pain:  Vitals:   01/01/24 1430  TempSrc:   PainSc: 3                  Irven Ingalsbe P Koty Anctil

## 2024-01-01 NOTE — Interval H&P Note (Signed)
 History and Physical Interval Note:  01/01/2024 11:50 AM  Lisa [name]  has presented today for surgery, with the diagnosis of fecal incontinence.  The various methods of treatment have been discussed with the patient and family. After consideration of risks, benefits and other options for treatment, the patient has consented to  Removal and replacement of sacral nerve stimulator and implantable pulse generator as a surgical intervention.  The patient's history has been reviewed, patient examined, no change in status, stable for surgery.  I have reviewed the patient's chart and labs.  Questions were answered to the patient's satisfaction.     Lisa [name]

## 2024-01-01 NOTE — Progress Notes (Signed)
 Fentanyl  50 mcg wasted after patient discharged, witnessed by Lani Goin,RN

## 2024-01-03 ENCOUNTER — Encounter (HOSPITAL_COMMUNITY): Payer: Self-pay | Admitting: Obstetrics and Gynecology

## 2024-01-28 ENCOUNTER — Encounter: Payer: Self-pay | Admitting: Obstetrics and Gynecology

## 2024-01-28 ENCOUNTER — Ambulatory Visit (INDEPENDENT_AMBULATORY_CARE_PROVIDER_SITE_OTHER): Admitting: Obstetrics and Gynecology

## 2024-01-28 VITALS — BP 124/83 | HR 101

## 2024-01-28 DIAGNOSIS — Z48816 Encounter for surgical aftercare following surgery on the genitourinary system: Secondary | ICD-10-CM

## 2024-01-28 DIAGNOSIS — Z9889 Other specified postprocedural states: Secondary | ICD-10-CM

## 2024-01-28 NOTE — Progress Notes (Signed)
  Urogynecology  Date of Visit: 01/28/2024  History of Present Illness: Lisa [name] is a 53 y.o. female scheduled today for a post-operative visit.   Surgery: s/p Removal and replacement of intersim sacral nerve stimulator and implantable pulse generator with fluoroscopic guidance and interpretation on 01/01/24 Wire in in S4 on the right, IPG is in the left buttock.   She reports she is doing well. She has no fecal incontinence episodes with the current settings. Brought her controller today but not charged- she does not remember what program she is on but has not adjusted it since the surgery. She had a little soreness and swelling around the pocket and incision sites initially but has resolved.    Medications: She has a current medication list which includes the following prescription(s): acetaminophen , acetaminophen , cyanocobalamin , doxylamine (sleep), latanoprost, lisinopril , metformin , multivitamin with minerals, oxycodone , pantoprazole , polycarbophil, simvastatin , sumatriptan , trazodone , and valacyclovir .   Allergies: Patient is allergic to nsaids and nitroglycerin .   Physical Exam: BP 124/83   Pulse (!) 101    Buttock and sacral incisions well healed. No tenderness or erythema present.  ---------------------------------------------------------  Assessment and Plan: No diagnosis found.  - Well healed - Will keep current interstim settings. Pt advised to contact us  if she experiences any changes.    Return in about 6 months (around 07/29/2024) for interstim follow up.  [name], MD

## 2024-04-18 ENCOUNTER — Other Ambulatory Visit: Payer: Self-pay | Admitting: Family Medicine

## 2024-04-18 DIAGNOSIS — K219 Gastro-esophageal reflux disease without esophagitis: Secondary | ICD-10-CM

## 2024-05-13 DIAGNOSIS — H47233 Glaucomatous optic atrophy, bilateral: Secondary | ICD-10-CM | POA: Diagnosis not present

## 2024-05-13 DIAGNOSIS — H401132 Primary open-angle glaucoma, bilateral, moderate stage: Secondary | ICD-10-CM | POA: Diagnosis not present

## 2024-06-16 DIAGNOSIS — H5213 Myopia, bilateral: Secondary | ICD-10-CM | POA: Diagnosis not present

## 2024-06-16 DIAGNOSIS — H524 Presbyopia: Secondary | ICD-10-CM | POA: Diagnosis not present

## 2024-06-16 DIAGNOSIS — H52223 Regular astigmatism, bilateral: Secondary | ICD-10-CM | POA: Diagnosis not present

## 2024-06-16 DIAGNOSIS — H401132 Primary open-angle glaucoma, bilateral, moderate stage: Secondary | ICD-10-CM | POA: Diagnosis not present

## 2024-07-09 ENCOUNTER — Other Ambulatory Visit: Payer: Self-pay | Admitting: Family Medicine

## 2024-07-09 DIAGNOSIS — F5101 Primary insomnia: Secondary | ICD-10-CM

## 2024-07-11 ENCOUNTER — Encounter: Payer: Self-pay | Admitting: *Deleted

## 2024-07-21 ENCOUNTER — Telehealth: Payer: Self-pay | Admitting: Obstetrics and Gynecology

## 2024-07-21 NOTE — Telephone Encounter (Signed)
 FYI- patient called today to cancel 6 month follow up.  Says she is doing well and says that she was told she could come back in one year if she is doing well.  I told her I would cancel her appt and call her back if you thought she should still follow up.  If one year follow up ok, I'll put in a recall to come one year from last office visit.

## 2024-07-24 ENCOUNTER — Other Ambulatory Visit: Payer: Self-pay | Admitting: Family Medicine

## 2024-07-24 DIAGNOSIS — K219 Gastro-esophageal reflux disease without esophagitis: Secondary | ICD-10-CM

## 2024-07-28 ENCOUNTER — Ambulatory Visit: Admitting: Obstetrics and Gynecology

## 2024-08-14 ENCOUNTER — Encounter: Payer: Self-pay | Admitting: Family Medicine

## 2024-08-14 DIAGNOSIS — R7303 Prediabetes: Secondary | ICD-10-CM

## 2024-08-14 MED ORDER — METFORMIN HCL 500 MG PO TABS: 500.0000 mg | ORAL_TABLET | Freq: Every day | ORAL | 3 refills | Status: AC

## 2024-08-16 NOTE — Progress Notes (Addendum)
 [facility] [address], [address] [place], [place] [address] [phone]  Date:  08/19/2024   Name:  Lisa [name]   DOB:  [birth year]   MRN:  [id number]  PCP:  Watt Harlene BROCKS, MD    Chief Complaint: Annual Exam   History of Present Illness:  Lisa [name] is a 54 y.o. very pleasant female patient who presents with the following:  Patient seen today for physical exam.  I saw her most recently in March Pt with history of fecal incontinence now with sacral neuromodulator, HTN, hyperlipidemia, mild diabetes, left hip replacement due to AVN, hemochromatosis managed by hematology, B12 deficiency Jaziyah has wanted to stay on Depo-Provera  as she has been concerned about severe sx with menstrual cycles    She had several health events in 2024:  She had a cholecystectomy on 09/11/22 She fell and broke her her leg at the end of May 2024- she fell during a fire drill at work and required surgery. She then had surgery again in August for repair of the left ankle issue  In May 2025 she had removal and replacement of her sacral nerve stimulator At her follow-up visit in June with Dr. Marilynne she seemed to be doing well with improved continence  We checked an [facility] in March which was elevated and I asked her to please stop using Depo-Provera -I believe she has done so No bleeding since she came off depo   Due for labs and A1c Mammogram- she will schedule  Eye exam- this is done on a regular basis for her glaucoma Can offer pneumococcal vaccination; will give today  Colonoscopy is up-to-date Pap completed in March 2025 Offer DEXA scan- will do now   Lab Results  Component Value Date   HGBA1C 6.4 11/11/2023    Lisinopril  10 Pantoprazole  Simvastatin  Metformin  Sumatriptan  Trazodone  as needed  Discussed the use of AI scribe software for clinical note transcription with the patient, who gave verbal consent to proceed.  History of  Present Illness Lisa [name] is a 54 year old female who presents for follow-up and management of her symptoms.  She had her sacral nerve stimulator changed since her last visit in March, which is functioning well. She attributes some symptoms to IBS, a long-standing condition, and initially struggled to differentiate these from other issues. She has had two FSH level tests in the past consistent with menopause.  She finally did stop using Depo-Provera .  She does have occasional hot flashes, particularly at night. She uses a fan to manage these symptoms and reports sweating during naps and at work, especially around her neck and head.  Overall her menopause symptoms are manageable and she has not had any menstrual bleeding  She has a toe issue that returned after dropping a sharp object on her foot about two weeks ago. The toe is discolored and painful when walking. She has been taking more B12, noting the discoloration occurred after the incident.  She has a history of glaucoma and undergoes eye exams every four months. She mentions needing a bone density test due to deteriorating bone in her mouth, leading to loose teeth, and has a history of easily breaking bones.  She is on several medications, including metformin , which she has not taken for about a week, and Protonix , which she takes daily. She recently refilled Imitrex  for headaches and is down to one or two doses. She manages her medications in  three-month supplies.  We talked about her bone density and potentially trying to come off of PPI.  However patient notes when she has tried to pause PPI previously she had a lot of GERD symptoms    Patient Active Problem List   Diagnosis Date Noted   Sprain of anterior talofibular ligament of left ankle 04/03/2023   Unstable ankle, left 04/03/2023   Closed displaced fracture of lateral malleolus of right fibula 02/10/2023   Elevated ferritin 08/27/2019   Fecal incontinence 01/15/2018    Controlled type 2 diabetes mellitus without complication, without long-term current use of insulin  (HCC) 02/14/2017   Left anterior cruciate ligament tear 08/03/2016   Acute medial meniscus tear of left knee 08/03/2016   Acute lateral meniscus tear of left knee 08/03/2016   History of abnormal cervical Pap smear 09/21/2013   Leukocytopenia 03/02/2013   S/P hip replacement 02/06/2013   Hyperlipidemia 10/13/2012   HTN (hypertension) 10/13/2012    Past Medical History:  Diagnosis Date   Anemia    hx of   Anxiety 20 years ago   Depression 20 years ago   Diabetes mellitus without complication (HCC)    type 2   GERD (gastroesophageal reflux disease)    Headache    Heart murmur    in childhood, no problems as an adult   History of blood transfusion 08/21/2022   History of kidney stones    passed stones   HSV (herpes simplex virus) infection    Hyperlipidemia    Hypertension    history of htn, no meds now   IBS (irritable bowel syndrome)    Substance abuse (HCC)    last use in her 30s, marijuana cocaine and crack   Ulcer    x2   Vaginal Pap smear, abnormal     Past Surgical History:  Procedure Laterality Date   ANKLE RECONSTRUCTION Left 04/03/2023   Procedure: LEFT LATERAL ANKLE LIGAMENT RECONSTRUCTION;  Surgeon: Harden Jerona GAILS, MD;  Location: [facility] OR;  Service: Orthopedics;  Laterality: Left;   ANTERIOR CRUCIATE LIGAMENT REPAIR Left 09/04/2016   Procedure: LEFT KNEE MEDIAL COLLATERAL LIGAMENT REPAIR, ANTERIOR CRUCIATE LIGAMENT RECONSTRUCTION, MENISCAL DEBRIDEMENT VERSUS REPAIR;  Surgeon: Glendia Cordella Hutchinson, MD;  Location: [facility] OR;  Service: Orthopedics;  Laterality: Left;   APPENDECTOMY     CHOLECYSTECTOMY N/A 08/20/2022   Procedure: LAPAROSCOPIC CHOLECYSTECTOMY;  Surgeon: Dasie Leonor CROME, MD;  Location: [facility] OR;  Service: General;  Laterality: N/A;   ESOPHAGOGASTRODUODENOSCOPY ENDOSCOPY     at least 4 last one 07/2022   INTERSTIM IMPLANT PLACEMENT N/A 01/01/2024   Procedure:  REMOVAL AND REPLACEMENT OF IMPLANTABLE PULSE GENERATOR/ PLACEMENT OF SACRAL NERVE STIMULATOR, REMOVAL OF PREVIOUSLY PLACED WIRE, STAGE 2;  Surgeon: Marilynne Rosaline SAILOR, MD;  Location: [facility] OR;  Service: Gynecology;  Laterality: N/A;  removal and replacement of interstim battery; total time needed is 1 hour   JOINT REPLACEMENT     LAPAROSCOPIC APPENDECTOMY N/A 02/23/2015   Procedure: APPENDECTOMY LAPAROSCOPIC;  Surgeon: Camellia Blush, MD;  Location: [facility] ORS;  Service: General;  Laterality: N/A;   ORIF ANKLE FRACTURE Right 01/22/2023   Procedure: OPEN REDUCTION INTERNAL FIXATION (ORIF) RIGHT ANKLE FRACTURE;  Surgeon: Hutchinson Cordella Glendia, MD;  Location: [facility] OR;  Service: Orthopedics;  Laterality: Right;   SACRAL NERVE STIMULATOR PLACEMENT     SPINAL CORD STIMULATOR IMPLANT  2020   [employer]   TOTAL HIP ARTHROPLASTY Left 02/06/2013   Procedure: LEFT TOTAL HIP ARTHROPLASTY ANTERIOR APPROACH;  Surgeon: Lonni CINDERELLA Poli, MD;  Location: [facility] ORS;  Service: Orthopedics;  Laterality: Left;    Social History[1]  Family History  Adopted: Yes  Problem Relation Age of Onset   Uterine cancer Neg Hx    Bladder Cancer Neg Hx     Allergies[2]  Medication list has been reviewed and updated.  Medications Ordered Prior to Encounter[3]  Review of Systems:  As per HPI- otherwise negative.   Physical Examination: Vitals:   08/19/24 1352  BP: 130/82  Pulse: 97  Temp: 98.5 F (36.9 C)  SpO2: 98%   Vitals:   08/19/24 1352  Weight: 183 lb 6.4 oz (83.2 kg)  Height: 5' 7 (1.702 m)   Body mass index is 28.72 kg/m. Ideal Body Weight: Weight in (lb) to have BMI = 25: 159.3  GEN: no acute distress.  Mild overweight, looks well HEENT: Atraumatic, Normocephalic.  Bilateral TM wnl, oropharynx normal.  PEERL,EOMI.   Ears and Nose: No external deformity. CV: RRR, No M/G/R. No JVD. No thrill. No extra heart sounds. PULM: CTA B, no wheezes, crackles, rhonchi. No retractions. No resp. distress. No  accessory muscle use. ABD: S, NT, ND, +BS. No rebound. No HSM. EXTR: No c/c/e PSYCH: Normally interactive. Conversant.    Assessment and Plan: Physical exam  Essential hypertension - Plan: CBC, Comprehensive metabolic panel with GFR  Other hyperlipidemia - Plan: Lipid panel  Controlled type 2 diabetes mellitus without complication, without long-term current use of insulin  (HCC) - Plan: Comprehensive metabolic panel with GFR, Hemoglobin A1c, Microalbumin / creatinine urine ratio  Thyroid  disorder screening - Plan: TSH  Screening for deficiency anemia - Plan: CBC  Menopause - Plan: [facility]  Assessment & Plan Woman's Wellness Visit Routine wellness visit with discussion on general health maintenance, including screenings and vaccinations. Weight loss since last visit noted. - Ordered bone density test.  She was on Depo-Provera  for a long time, her dentist recently expressed some concern about bone density in her jaw - Administered pneumonia vaccine. - Confirmed mammogram is scheduled.  Menopause and menopausal symptoms Menopausal symptoms include hot flashes, particularly at night and during naps. Previous FSH levels indicated menopause. No recent bleeding. Discussed the definition and duration of menopause. - Ordered FSH level to confirm menopausal status now that she is off of hormones.  Type 2 diabetes mellitus Type 2 diabetes is well-controlled. Metformin  prescription was not picked up for a week, but this is not expected to be a significant issue. - Pick up metformin  prescription.  Gastroesophageal reflux disease GERD managed with pantoprazole . Discussed potential impact of pantoprazole  on calcium  absorption and bone density. Previous attempts to discontinue pantoprazole  resulted in increased acid and pain. - Try pantoprazole  every other day.  Migraine Imitrex  prescription is sufficient for current needs. - Refilled Imitrex  prescription.  Signed Harlene Schroeder, MD  The  1/8, received labs as below.  Message to patient  Results for orders placed or performed in visit on 08/19/24  CBC   Collection Time: 08/19/24  2:30 PM  Result Value Ref Range   WBC 4.8 4.0 - 10.5 K/uL   RBC 4.28 3.87 - 5.11 Mil/uL   Platelets 284.0 150.0 - 400.0 K/uL   Hemoglobin 12.6 12.0 - 15.0 g/dL   HCT 61.3 63.9 - 53.9 %   MCV 90.0 78.0 - 100.0 fl   MCHC 32.8 30.0 - 36.0 g/dL   RDW 87.4 88.4 - 84.4 %  Comprehensive metabolic panel with GFR   Collection Time: 08/19/24  2:30 PM  Result Value Ref Range  Sodium 139 135 - 145 mEq/L   Potassium 4.1 3.5 - 5.1 mEq/L   Chloride 100 96 - 112 mEq/L   CO2 29 19 - 32 mEq/L   Glucose, Bld 104 (H) 70 - 99 mg/dL   BUN 12 6 - 23 mg/dL   Creatinine, Ser 9.48 0.40 - 1.20 mg/dL   Total Bilirubin 0.5 0.2 - 1.2 mg/dL   Alkaline Phosphatase 62 39 - 117 U/L   AST 30 5 - 37 U/L   ALT 31 3 - 35 U/L   Total Protein 7.4 6.0 - 8.3 g/dL   Albumin 4.7 3.5 - 5.2 g/dL   GFR 892.36 >39.99 mL/min   Calcium  9.8 8.4 - 10.5 mg/dL  Hemoglobin J8r   Collection Time: 08/19/24  2:30 PM  Result Value Ref Range   Hgb A1c MFr Bld 6.7 (H) 4.6 - 6.5 %  Lipid panel   Collection Time: 08/19/24  2:30 PM  Result Value Ref Range   Cholesterol 231 (H) 28 - 200 mg/dL   Triglycerides 715.9 (H) 10.0 - 149.0 mg/dL   HDL 17.09 >60.99 mg/dL   VLDL 43.1 (H) 0.0 - 59.9 mg/dL   LDL Cholesterol 92 10 - 99 mg/dL   Total CHOL/HDL Ratio 3    NonHDL 148.48   Microalbumin / creatinine urine ratio   Collection Time: 08/19/24  2:30 PM  Result Value Ref Range   Microalb, Ur <0.7 mg/dL   Creatinine,U 888.6 mg/dL   Microalb Creat Ratio Unable to calculate 0.0 - 30.0 mg/g  TSH   Collection Time: 08/19/24  2:30 PM  Result Value Ref Range   TSH 1.07 0.35 - 5.50 uIU/mL  FSH   Collection Time: 08/19/24  2:30 PM  Result Value Ref Range   FSH 43.8 mIU/ML       [1]  Social History Tobacco Use   Smoking status: Former    Current packs/day: 0.00    Average packs/day: 0.3  packs/day for 20.0 years (5.0 ttl pk-yrs)    Types: Cigarettes    Start date: 08/13/1993    Quit date: 08/13/2013    Years since quitting: 11.0   Smokeless tobacco: Never  Vaping Use   Vaping status: Never Used  Substance Use Topics   Alcohol use: Yes    Alcohol/week: 3.0 - 4.0 standard drinks of alcohol    Types: 3 - 4 Glasses of wine per week    Comment: few glasses of wine a week   Drug use: Not Currently    Types: Marijuana, Cocaine, Crack cocaine    Comment: last used back in her 30s  [2]  Allergies Allergen Reactions   Nsaids Other (See Comments)    ulcers   Nitroglycerin      Pt took Nitroglycerin  patches - pt stated, Made my skin bright red when I changed my patch; it made my foot itch and burn  [3]  Current Outpatient Medications on File Prior to Visit  Medication Sig Dispense Refill   acetaminophen  (TYLENOL ) 500 MG tablet Take 1 tablet (500 mg total) by mouth every 6 (six) hours as needed for mild pain. (Patient taking differently: Take 1,000 mg by mouth every 6 (six) hours as needed for mild pain (pain score 1-3).) 30 tablet 0   acetaminophen  (TYLENOL ) 500 MG tablet Take 1 tablet (500 mg total) by mouth every 6 (six) hours as needed (pain). 30 tablet 0   cyanocobalamin  (VITAMIN B12) 1000 MCG tablet Take 1,000 mcg by mouth 2 (two) times a week.  doxylamine, Sleep, (UNISOM) 25 MG tablet Take 25-50 mg by mouth at bedtime as needed for sleep.     latanoprost (XALATAN) 0.005 % ophthalmic solution Place 1 drop into both eyes at bedtime.     lisinopril  (ZESTRIL ) 10 MG tablet Take 1 tablet (10 mg total) by mouth daily. 90 tablet 2   metFORMIN  (GLUCOPHAGE ) 500 MG tablet Take 1 tablet (500 mg total) by mouth daily. 90 tablet 3   Multiple Vitamins-Minerals (MULTIVITAMIN WITH MINERALS) tablet Take 1 tablet by mouth daily.     oxyCODONE  (OXY IR/ROXICODONE ) 5 MG immediate release tablet Take 1 tablet (5 mg total) by mouth every 4 (four) hours as needed for severe pain (pain score  7-10). 10 tablet 0   pantoprazole  (PROTONIX ) 40 MG tablet Take 1 tablet (40 mg total) by mouth daily. 90 tablet 0   polycarbophil (FIBERCON) 625 MG tablet Take 1,875 mg by mouth every evening.     simvastatin  (ZOCOR ) 20 MG tablet TAKE 1 TABLET BY MOUTH EVERY DAY IN THE EVENING 90 tablet 3   SUMAtriptan  (IMITREX ) 50 MG tablet TAKE 1 TABLET BY MOUTH EVERY 2 (TWO) HOURS AS NEEDED FOR MIGRAINE. MAX 100 MG IN 24 HOURS 9 tablet 0   traZODone  (DESYREL ) 50 MG tablet TAKE 3 TABLETS(150 MG) BY MOUTH AT BEDTIME AS NEEDED FOR SLEEP 270 tablet 0   valACYclovir  (VALTREX ) 1000 MG tablet TAKE 1 TABLET BY MOUTH EVERY DAY (Patient taking differently: Take 1,000 mg by mouth daily as needed (break outs).) 30 tablet 5   No current facility-administered medications on file prior to visit.   "

## 2024-08-16 NOTE — Patient Instructions (Signed)
 It was great to see you again today, I will be in touch with your labs Please stop by imaging on the ground floor to set up your bone density scan Pneumonia vaccine today

## 2024-08-19 ENCOUNTER — Ambulatory Visit: Admitting: Family Medicine

## 2024-08-19 ENCOUNTER — Encounter: Payer: Self-pay | Admitting: Family Medicine

## 2024-08-19 VITALS — BP 130/82 | HR 97 | Temp 98.5°F | Ht 67.0 in | Wt 183.4 lb

## 2024-08-19 DIAGNOSIS — K219 Gastro-esophageal reflux disease without esophagitis: Secondary | ICD-10-CM

## 2024-08-19 DIAGNOSIS — E7849 Other hyperlipidemia: Secondary | ICD-10-CM | POA: Diagnosis not present

## 2024-08-19 DIAGNOSIS — Z23 Encounter for immunization: Secondary | ICD-10-CM | POA: Diagnosis not present

## 2024-08-19 DIAGNOSIS — Z130 Encounter for screening for diseases of the blood and blood-forming organs and certain disorders involving the immune mechanism: Secondary | ICD-10-CM

## 2024-08-19 DIAGNOSIS — Z0001 Encounter for general adult medical examination with abnormal findings: Secondary | ICD-10-CM

## 2024-08-19 DIAGNOSIS — I10 Essential (primary) hypertension: Secondary | ICD-10-CM

## 2024-08-19 DIAGNOSIS — Z780 Asymptomatic menopausal state: Secondary | ICD-10-CM | POA: Diagnosis not present

## 2024-08-19 DIAGNOSIS — Z1329 Encounter for screening for other suspected endocrine disorder: Secondary | ICD-10-CM | POA: Diagnosis not present

## 2024-08-19 DIAGNOSIS — E119 Type 2 diabetes mellitus without complications: Secondary | ICD-10-CM | POA: Diagnosis not present

## 2024-08-19 DIAGNOSIS — G43009 Migraine without aura, not intractable, without status migrainosus: Secondary | ICD-10-CM

## 2024-08-19 DIAGNOSIS — Z7984 Long term (current) use of oral hypoglycemic drugs: Secondary | ICD-10-CM | POA: Diagnosis not present

## 2024-08-19 DIAGNOSIS — Z0000 Encounter for general adult medical examination without abnormal findings: Secondary | ICD-10-CM

## 2024-08-19 DIAGNOSIS — E2839 Other primary ovarian failure: Secondary | ICD-10-CM

## 2024-08-19 MED ORDER — PANTOPRAZOLE SODIUM 40 MG PO TBEC: 40.0000 mg | DELAYED_RELEASE_TABLET | Freq: Every day | ORAL | 3 refills | Status: AC

## 2024-08-19 MED ORDER — SUMATRIPTAN SUCCINATE 50 MG PO TABS: ORAL_TABLET | ORAL | 3 refills | Status: AC

## 2024-08-20 ENCOUNTER — Encounter: Payer: Self-pay | Admitting: Family Medicine

## 2024-08-20 DIAGNOSIS — L03039 Cellulitis of unspecified toe: Secondary | ICD-10-CM

## 2024-08-20 LAB — LIPID PANEL
Cholesterol: 231 mg/dL — ABNORMAL HIGH (ref 28–200)
HDL: 82.9 mg/dL
LDL Cholesterol: 92 mg/dL (ref 10–99)
NonHDL: 148.48
Total CHOL/HDL Ratio: 3
Triglycerides: 284 mg/dL — ABNORMAL HIGH (ref 10.0–149.0)
VLDL: 56.8 mg/dL — ABNORMAL HIGH (ref 0.0–40.0)

## 2024-08-20 LAB — COMPREHENSIVE METABOLIC PANEL WITH GFR
ALT: 31 U/L (ref 3–35)
AST: 30 U/L (ref 5–37)
Albumin: 4.7 g/dL (ref 3.5–5.2)
Alkaline Phosphatase: 62 U/L (ref 39–117)
BUN: 12 mg/dL (ref 6–23)
CO2: 29 meq/L (ref 19–32)
Calcium: 9.8 mg/dL (ref 8.4–10.5)
Chloride: 100 meq/L (ref 96–112)
Creatinine, Ser: 0.51 mg/dL (ref 0.40–1.20)
GFR: 107.63 mL/min
Glucose, Bld: 104 mg/dL — ABNORMAL HIGH (ref 70–99)
Potassium: 4.1 meq/L (ref 3.5–5.1)
Sodium: 139 meq/L (ref 135–145)
Total Bilirubin: 0.5 mg/dL (ref 0.2–1.2)
Total Protein: 7.4 g/dL (ref 6.0–8.3)

## 2024-08-20 LAB — MICROALBUMIN / CREATININE URINE RATIO
Creatinine,U: 111.3 mg/dL
Microalb Creat Ratio: UNDETERMINED mg/g (ref 0.0–30.0)
Microalb, Ur: <0.7 mg/dL

## 2024-08-20 LAB — CBC
HCT: 38.6 % (ref 36.0–46.0)
Hemoglobin: 12.6 g/dL (ref 12.0–15.0)
MCHC: 32.8 g/dL (ref 30.0–36.0)
MCV: 90 fl (ref 78.0–100.0)
Platelets: 284 K/uL (ref 150.0–400.0)
RBC: 4.28 Mil/uL (ref 3.87–5.11)
RDW: 12.5 % (ref 11.5–15.5)
WBC: 4.8 K/uL (ref 4.0–10.5)

## 2024-08-20 LAB — FOLLICLE STIMULATING HORMONE: FSH: 43.8 m[IU]/mL

## 2024-08-20 LAB — HEMOGLOBIN A1C: Hgb A1c MFr Bld: 6.7 % — ABNORMAL HIGH (ref 4.6–6.5)

## 2024-08-20 LAB — TSH: TSH: 1.07 u[IU]/mL (ref 0.35–5.50)

## 2024-08-24 ENCOUNTER — Encounter: Payer: Self-pay | Admitting: *Deleted

## 2024-08-24 MED ORDER — CEPHALEXIN 500 MG PO CAPS: 500.0000 mg | ORAL_CAPSULE | Freq: Three times a day (TID) | ORAL | 0 refills | Status: AC

## 2024-09-09 ENCOUNTER — Other Ambulatory Visit (HOSPITAL_BASED_OUTPATIENT_CLINIC_OR_DEPARTMENT_OTHER)

## 2024-10-06 ENCOUNTER — Other Ambulatory Visit (HOSPITAL_BASED_OUTPATIENT_CLINIC_OR_DEPARTMENT_OTHER)
